# Patient Record
Sex: Female | Born: 1975 | Hispanic: No | Smoking: Former smoker
Health system: Southern US, Community
[De-identification: ages and names within clinical notes are randomized; demographics above are authoritative.]

## PROBLEM LIST (undated history)

## (undated) DIAGNOSIS — F32A Depression, unspecified: Secondary | ICD-10-CM

## (undated) DIAGNOSIS — M797 Fibromyalgia: Secondary | ICD-10-CM

## (undated) DIAGNOSIS — T8859XA Other complications of anesthesia, initial encounter: Secondary | ICD-10-CM

## (undated) DIAGNOSIS — T4145XA Adverse effect of unspecified anesthetic, initial encounter: Secondary | ICD-10-CM

## (undated) DIAGNOSIS — C801 Malignant (primary) neoplasm, unspecified: Secondary | ICD-10-CM

## (undated) DIAGNOSIS — I10 Essential (primary) hypertension: Secondary | ICD-10-CM

## (undated) DIAGNOSIS — E039 Hypothyroidism, unspecified: Secondary | ICD-10-CM

## (undated) DIAGNOSIS — Z8489 Family history of other specified conditions: Secondary | ICD-10-CM

## (undated) DIAGNOSIS — F329 Major depressive disorder, single episode, unspecified: Secondary | ICD-10-CM

## (undated) DIAGNOSIS — G43909 Migraine, unspecified, not intractable, without status migrainosus: Secondary | ICD-10-CM

## (undated) HISTORY — DX: Essential (primary) hypertension: I10

## (undated) HISTORY — DX: Fibromyalgia: M79.7

## (undated) HISTORY — PX: WISDOM TOOTH EXTRACTION: SHX21

## (undated) HISTORY — PX: CERVICAL CONIZATION W/BX: SHX1330

## (undated) HISTORY — DX: Migraine, unspecified, not intractable, without status migrainosus: G43.909

## (undated) HISTORY — PX: MICRODISCECTOMY LUMBAR: SUR864

## (undated) HISTORY — DX: Hypothyroidism, unspecified: E03.9

## (undated) HISTORY — DX: Malignant (primary) neoplasm, unspecified: C80.1

## (undated) HISTORY — DX: Major depressive disorder, single episode, unspecified: F32.9

## (undated) HISTORY — DX: Depression, unspecified: F32.A

---

## 1995-02-03 HISTORY — PX: BREAST SURGERY: SHX581

## 2002-05-11 ENCOUNTER — Encounter (INDEPENDENT_AMBULATORY_CARE_PROVIDER_SITE_OTHER): Payer: Self-pay | Admitting: *Deleted

## 2002-05-11 ENCOUNTER — Ambulatory Visit (HOSPITAL_COMMUNITY): Admission: RE | Admit: 2002-05-11 | Discharge: 2002-05-11 | Payer: Self-pay | Admitting: Obstetrics

## 2002-06-21 ENCOUNTER — Ambulatory Visit: Admission: RE | Admit: 2002-06-21 | Discharge: 2002-06-21 | Payer: Self-pay | Admitting: Gynecology

## 2002-07-18 ENCOUNTER — Ambulatory Visit (HOSPITAL_COMMUNITY): Admission: RE | Admit: 2002-07-18 | Discharge: 2002-07-18 | Payer: Self-pay | Admitting: Gynecology

## 2002-07-18 ENCOUNTER — Encounter (INDEPENDENT_AMBULATORY_CARE_PROVIDER_SITE_OTHER): Payer: Self-pay | Admitting: Specialist

## 2002-10-04 ENCOUNTER — Ambulatory Visit: Admission: RE | Admit: 2002-10-04 | Discharge: 2002-10-04 | Payer: Self-pay | Admitting: Gynecology

## 2003-08-16 ENCOUNTER — Ambulatory Visit (HOSPITAL_COMMUNITY): Admission: RE | Admit: 2003-08-16 | Discharge: 2003-08-16 | Payer: Self-pay | Admitting: Obstetrics

## 2003-09-13 ENCOUNTER — Ambulatory Visit (HOSPITAL_COMMUNITY): Admission: RE | Admit: 2003-09-13 | Discharge: 2003-09-13 | Payer: Self-pay | Admitting: Internal Medicine

## 2004-11-12 ENCOUNTER — Ambulatory Visit: Payer: Self-pay

## 2005-04-14 ENCOUNTER — Ambulatory Visit: Payer: Self-pay | Admitting: Internal Medicine

## 2007-01-24 ENCOUNTER — Ambulatory Visit: Payer: Self-pay | Admitting: Internal Medicine

## 2007-01-24 DIAGNOSIS — F329 Major depressive disorder, single episode, unspecified: Secondary | ICD-10-CM | POA: Insufficient documentation

## 2007-01-24 DIAGNOSIS — I10 Essential (primary) hypertension: Secondary | ICD-10-CM | POA: Insufficient documentation

## 2007-01-24 LAB — CONVERTED CEMR LAB
ALT: 27 units/L (ref 0–35)
AST: 24 units/L (ref 0–37)
Albumin: 4.1 g/dL (ref 3.5–5.2)
Alkaline Phosphatase: 76 units/L (ref 39–117)
BUN: 16 mg/dL (ref 6–23)
Basophils Absolute: 0 10*3/uL (ref 0.0–0.1)
Basophils Relative: 0 % (ref 0.0–1.0)
Bilirubin Urine: NEGATIVE
Bilirubin, Direct: 0.1 mg/dL (ref 0.0–0.3)
CO2: 30 meq/L (ref 19–32)
Calcium: 9.5 mg/dL (ref 8.4–10.5)
Chloride: 101 meq/L (ref 96–112)
Creatinine, Ser: 1 mg/dL (ref 0.4–1.2)
Crystals: NEGATIVE
Eosinophils Absolute: 0.1 10*3/uL (ref 0.0–0.6)
Eosinophils Relative: 1.2 % (ref 0.0–5.0)
GFR calc Af Amer: 83 mL/min
GFR calc non Af Amer: 69 mL/min
Glucose, Bld: 96 mg/dL (ref 70–99)
HCT: 46.2 % — ABNORMAL HIGH (ref 36.0–46.0)
Hemoglobin: 16.1 g/dL — ABNORMAL HIGH (ref 12.0–15.0)
Ketones, ur: NEGATIVE mg/dL
Leukocytes, UA: NEGATIVE
Lymphocytes Relative: 22.8 % (ref 12.0–46.0)
MCHC: 34.9 g/dL (ref 30.0–36.0)
MCV: 90.9 fL (ref 78.0–100.0)
Monocytes Absolute: 0.2 10*3/uL (ref 0.2–0.7)
Monocytes Relative: 2.3 % — ABNORMAL LOW (ref 3.0–11.0)
Mucus, UA: NEGATIVE
Neutro Abs: 7.3 10*3/uL (ref 1.4–7.7)
Neutrophils Relative %: 73.7 % (ref 43.0–77.0)
Nitrite: NEGATIVE
Platelets: 214 10*3/uL (ref 150–400)
Potassium: 4.2 meq/L (ref 3.5–5.1)
RBC: 5.08 M/uL (ref 3.87–5.11)
RDW: 11.3 % — ABNORMAL LOW (ref 11.5–14.6)
Sodium: 142 meq/L (ref 135–145)
Specific Gravity, Urine: 1.015 (ref 1.000–1.03)
TSH: 1.06 microintl units/mL (ref 0.35–5.50)
Total Bilirubin: 0.7 mg/dL (ref 0.3–1.2)
Total Protein, Urine: NEGATIVE mg/dL
Total Protein: 7.2 g/dL (ref 6.0–8.3)
Urine Glucose: NEGATIVE mg/dL
Urobilinogen, UA: 0.2 (ref 0.0–1.0)
WBC: 9.9 10*3/uL (ref 4.5–10.5)
pH: 6.5 (ref 5.0–8.0)

## 2007-01-25 LAB — CONVERTED CEMR LAB
ALT: 27 units/L (ref 0–35)
AST: 24 units/L (ref 0–37)
Albumin: 4.1 g/dL (ref 3.5–5.2)
Alkaline Phosphatase: 76 units/L (ref 39–117)
BUN: 16 mg/dL (ref 6–23)
Basophils Absolute: 0 10*3/uL (ref 0.0–0.1)
Basophils Relative: 0 % (ref 0.0–1.0)
Bilirubin Urine: NEGATIVE
Bilirubin, Direct: 0.1 mg/dL (ref 0.0–0.3)
CO2: 30 meq/L (ref 19–32)
Calcium: 9.5 mg/dL (ref 8.4–10.5)
Chloride: 101 meq/L (ref 96–112)
Creatinine, Ser: 1 mg/dL (ref 0.4–1.2)
Crystals: NEGATIVE
Eosinophils Absolute: 0.1 10*3/uL (ref 0.0–0.6)
Eosinophils Relative: 1.2 % (ref 0.0–5.0)
GFR calc Af Amer: 83 mL/min
GFR calc non Af Amer: 69 mL/min
Glucose, Bld: 96 mg/dL (ref 70–99)
HCT: 46.2 % — ABNORMAL HIGH (ref 36.0–46.0)
Hemoglobin: 16.1 g/dL — ABNORMAL HIGH (ref 12.0–15.0)
Ketones, ur: NEGATIVE mg/dL
Leukocytes, UA: NEGATIVE
Lymphocytes Relative: 22.8 % (ref 12.0–46.0)
MCHC: 34.9 g/dL (ref 30.0–36.0)
MCV: 90.9 fL (ref 78.0–100.0)
Monocytes Absolute: 0.2 10*3/uL (ref 0.2–0.7)
Monocytes Relative: 2.3 % — ABNORMAL LOW (ref 3.0–11.0)
Mucus, UA: NEGATIVE
Neutro Abs: 7.3 10*3/uL (ref 1.4–7.7)
Neutrophils Relative %: 73.7 % (ref 43.0–77.0)
Nitrite: NEGATIVE
Platelets: 214 10*3/uL (ref 150–400)
Potassium: 4.2 meq/L (ref 3.5–5.1)
RBC: 5.08 M/uL (ref 3.87–5.11)
RDW: 11.3 % — ABNORMAL LOW (ref 11.5–14.6)
Sodium: 142 meq/L (ref 135–145)
Specific Gravity, Urine: 1.015 (ref 1.000–1.03)
TSH: 1.06 microintl units/mL (ref 0.35–5.50)
Total Bilirubin: 0.7 mg/dL (ref 0.3–1.2)
Total Protein, Urine: NEGATIVE mg/dL
Total Protein: 7.2 g/dL (ref 6.0–8.3)
Urine Glucose: NEGATIVE mg/dL
Urobilinogen, UA: 0.2 (ref 0.0–1.0)
WBC: 9.9 10*3/uL (ref 4.5–10.5)
pH: 6.5 (ref 5.0–8.0)

## 2007-06-23 ENCOUNTER — Ambulatory Visit: Payer: Self-pay | Admitting: Internal Medicine

## 2007-06-23 DIAGNOSIS — G43009 Migraine without aura, not intractable, without status migrainosus: Secondary | ICD-10-CM | POA: Insufficient documentation

## 2007-06-23 DIAGNOSIS — K5289 Other specified noninfective gastroenteritis and colitis: Secondary | ICD-10-CM | POA: Insufficient documentation

## 2007-06-29 ENCOUNTER — Telehealth: Payer: Self-pay | Admitting: Internal Medicine

## 2007-06-29 ENCOUNTER — Encounter: Payer: Self-pay | Admitting: Internal Medicine

## 2007-07-07 ENCOUNTER — Encounter: Payer: Self-pay | Admitting: Internal Medicine

## 2007-07-13 ENCOUNTER — Telehealth (INDEPENDENT_AMBULATORY_CARE_PROVIDER_SITE_OTHER): Payer: Self-pay | Admitting: *Deleted

## 2007-09-19 ENCOUNTER — Telehealth (INDEPENDENT_AMBULATORY_CARE_PROVIDER_SITE_OTHER): Payer: Self-pay | Admitting: *Deleted

## 2008-03-19 ENCOUNTER — Telehealth (INDEPENDENT_AMBULATORY_CARE_PROVIDER_SITE_OTHER): Payer: Self-pay | Admitting: *Deleted

## 2008-07-17 ENCOUNTER — Emergency Department: Payer: Self-pay | Admitting: Emergency Medicine

## 2008-07-26 ENCOUNTER — Ambulatory Visit: Payer: Self-pay | Admitting: Otolaryngology

## 2008-08-03 ENCOUNTER — Ambulatory Visit: Payer: Self-pay | Admitting: Internal Medicine

## 2010-03-06 NOTE — Progress Notes (Signed)
Summary: xanax  Phone Note Refill Request Message from:  Fax from Pharmacy on March 19, 2008 12:32 PM  Refills Requested: Medication #1:  ALPRAZOLAM 0.5 MG TABS Take 1/2 - 1 tablet by mouth twice a day   Dosage confirmed as above?Dosage Confirmed lst appt 06/23/2007  Initial call taken by: Windell Norfolk,  March 19, 2008 12:31 PM  Follow-up for Phone Call        done hardcopy to LIM side B - debra  Follow-up by: Corwin Levins MD,  March 19, 2008 12:51 PM  Additional Follow-up for Phone Call Additional follow up Details #1::        [facility]  [id number] ([facility] [address] [place], [place]  [address] Ph: [phone] Fax: [phone] Additional Follow-up by: Shelbie Proctor,  March 19, 2008 2:28 PM    New/Updated Medications: ALPRAZOLAM 0.5 MG TABS (ALPRAZOLAM) Take 1/2 - 1 tablet by mouth twice a day as needed   Prescriptions: ALPRAZOLAM 0.5 MG TABS (ALPRAZOLAM) Take 1/2 - 1 tablet by mouth twice a day as needed  #60 x 2   Entered and Authorized by:   Corwin Levins MD   Signed by:   Corwin Levins MD on 03/19/2008   Method used:   Print then Give to Patient   RxID:   [id number]

## 2010-03-06 NOTE — Progress Notes (Signed)
Summary: alprazolam  Phone Note From Pharmacy   [facility]  [id number] Reason for Call: Needs renewal Summary of Call: Pt requesting renewal on alprazolam 0.5mg  # 60 take 1 two times a day. Last filled 06/13/07. Last ov 06/23/07 Initial call taken by: Orlan Leavens,  September 19, 2007 2:25 PM  Follow-up for Phone Call        ok for hardcopy to debra Follow-up by: Corwin Levins MD,  September 19, 2007 3:52 PM  Additional Follow-up for Phone Call Additional follow up Details #1::        September 19, 2007 4:01 PM called pt left voivce mail on home machine stating that rx hardcopy will be left at the front office for pick up Additional Follow-up by: Shelbie Proctor,  September 19, 2007 4:02 PM      Prescriptions: ALPRAZOLAM 0.5 MG TABS (ALPRAZOLAM) Take 1/2 - 1 tablet by mouth twice a day  #60 x 2   Entered and Authorized by:   Corwin Levins MD   Signed by:   Corwin Levins MD on 09/19/2007   Method used:   Print then Give to Patient   RxID:   [id number]    Appended Document: alprazolam pt called to day spoke with cherly wanted to know if the office can fax her rx to the pharmacy , I faxed the rx hardcopy to the [facility] [place] [facility] pharmacy [phone]

## 2010-03-06 NOTE — Letter (Signed)
Summary: Out of Work  [facility]  [address] [place], [place] [address]   Phone: [phone]  Fax: [phone]    Jun 23, 2007   Employee:  THOMASENIA DOWSE    To Whom It May Concern:   For Medical reasons, please excuse the above named employee from work for the following dates:  Start:   Jun 23, 2007  End:   Jun 29, 2007 ---    to return to work Jun 30, 2007  If you need additional information, please feel free to contact our office.         Sincerely,    Corwin Levins MD

## 2010-03-06 NOTE — Progress Notes (Signed)
Summary: NOt feeling better  Phone Note Call from Patient Call back at [phone]   Caller: Patient Call For: Dr Jonny Ruiz Summary of Call: Patient called stating that she is not feeling any better. She c/o weakness, congestion, coughing and no better since last appt. Sudafed is not helping at all. Pt thinks that an antibiotic may be needed. Please advise, pt  seen 06/23/07 Initial call taken by: Rock Nephew CMA,  Jun 29, 2007 1:42 PM  Follow-up for Phone Call        ok for zpack - done hardcopy Follow-up by: Corwin Levins MD,  Jun 29, 2007 2:49 PM  Additional Follow-up for Phone Call Additional follow up Details #1::        [facility] at [phone].   Pt notified. Patient requested  another work note, orig note has her returning 06/30/07 Additional Follow-up by: Rock Nephew CMA,  Jun 29, 2007 2:55 PM    Additional Follow-up for Phone Call Additional follow up Details #2::    ok to extend note to june 1 - ann to arrange Follow-up by: Corwin Levins MD,  Jun 29, 2007 3:06 PM  Additional Follow-up for Phone Call Additional follow up Details #3:: Details for Additional Follow-up Action Taken: out of work note faxed [phone] Additional Follow-up by: Maris Berger,  Jun 29, 2007 3:46 PM  New/Updated Medications: AZITHROMYCIN 250 MG  TABS (AZITHROMYCIN) 2po qd for 1 day, then 1po qd for 4days, then stop   Prescriptions: AZITHROMYCIN 250 MG  TABS (AZITHROMYCIN) 2po qd for 1 day, then 1po qd for 4days, then stop  #6 x 1   Entered and Authorized by:   Corwin Levins MD   Signed by:   Corwin Levins MD on 06/29/2007   Method used:   Print then Give to Patient   RxID:   [id number]

## 2010-03-06 NOTE — Consult Note (Signed)
[facility]   Imported By: Esmeralda Links D'jimraou 07/15/2007 12:36:21  _____________________________________________________________________  External Attachment:    Type:   Image     Comment:   External Document

## 2010-03-06 NOTE — Assessment & Plan Note (Signed)
Summary: headache/jss   Vital Signs:  Patient Profile:   35 Years Old Female Weight:      148 pounds Temp:     99.1 degrees F oral Pulse rate:   87 / minute BP sitting:   149 / 96  (right arm) Cuff size:   regular  Pt. in pain?   yes    Location:   head    Intensity:   5    Type:       aching  Vitals Entered By: Maris Berger (Jun 23, 2007 11:11 AM)                  Chief Complaint:  Headache.  History of Present Illness: here with acute onset malaise, fever, crampy abd pains and watery loose stool for 2 days; prior to that has had near daily migrain type headaches with throbbing, photophobia and nausea; has ongoing depression but could not tolerate the effexor; xanax working ok; husband lost job several months ago; she is under much stress over quotas at work - economy bad    Updated Prior Medication List: ALPRAZOLAM 0.5 MG TABS (ALPRAZOLAM) Take 1/2 - 1 tablet by mouth twice a day LEXAPRO 10 MG  TABS (ESCITALOPRAM OXALATE) 1po qd HYDROCHLOROTHIAZIDE 25 MG  TABS (HYDROCHLOROTHIAZIDE) Take1/2 by mouth qd FIORICET 50-325-40 MG  TABS (BUTALBITAL-APAP-CAFFEINE) 1po once daily as needed migraine PROMETHAZINE HCL 25 MG  TABS (PROMETHAZINE HCL) 1 by mouth q 6 hrs as needed LOMOTIL 2.5-0.025 MG  TABS (DIPHENOXYLATE-ATROPINE) 1 by mouth as needed loose stool - max 8 tabs per 24 hrs  Current Allergies (reviewed today): ! PROZAC (FLUOXETINE HCL) ! * EFFEXOR XR ZOLPIDEM TARTRATE (ZOLPIDEM TARTRATE)  Past Medical History:    Reviewed history from 01/24/2007 and no changes required:       Hypertension       cervical cancer       Depression       migraine  Past Surgical History:    Reviewed history from 01/24/2007 and no changes required:       breast reduction 1997   Family History:    Reviewed history from 01/24/2007 and no changes required:       mother with cervical cancer, depression       stroke       HTN       heart disease  Social History:  Reviewed history from 01/24/2007 and no changes required:       Current Smoker       Alcohol use-yes       Married    Review of Systems       all otherwise negative    Physical Exam  General:     mild ill Head:     Normocephalic and atraumatic without obvious abnormalities. No apparent alopecia or balding. Eyes:     No corneal or conjunctival inflammation noted. EOMI. Perrla. Ears:     bilat tm;s red Nose:     nasal dischargemucosal pallor and mucosal erythema.   Mouth:     good dentition and pharyngeal erythema.   Neck:     supple and full ROM.   Lungs:     Normal respiratory effort, chest expands symmetrically. Lungs are clear to auscultation, no crackles or wheezes. Heart:     Normal rate and regular rhythm. S1 and S2 normal without gallop, murmur, click, rub or other extra sounds. Abdomen:     diffuse mild tender, pos BS Extremities:  no edema, no ulcers     Impression & Recommendations:  Problem # 1:  GASTROENTERITIS, ACUTE (ICD-558.9) off work for one wk, lots of fluids, tylenol/advil as needed; phenergan and lomotil prn Her updated medication list for this problem includes:    Lomotil 2.5-0.025 Mg Tabs (Diphenoxylate-atropine) .[name]... 1 by mouth as needed loose stool - max 8 tabs per 24 hrs   Problem # 2:  HYPERTENSION (ICD-401.9)  Her updated medication list for this problem includes:    Hydrochlorothiazide 25 Mg Tabs (Hydrochlorothiazide) .[name]... Take1/2 by mouth qd ok to hold off on the HCTZ for at least one wk due to the diarrhea BP today: 149/96 Prior BP: 139/94 (01/24/2007)  Labs Reviewed: Creat: 1.0 (01/24/2007)   Problem # 3:  MIGRAINE, COMMON (ICD-346.10) tx with fioricet as needed, [facility] Orders: [facility] (Headache)  Her updated medication list for this problem includes:    Fioricet 50-325-40 Mg Tabs (Butalbital-apap-caffeine) .[name]... 1po once daily as needed migraine   Problem # 4:  DEPRESSION  (ICD-311)  Her updated medication list for this problem includes:    Alprazolam 0.5 Mg Tabs (Alprazolam) .[name]... Take 1/2 - 1 tablet by mouth twice a day    Lexapro 10 Mg Tabs (Escitalopram oxalate) .[name]... 1po qd treat as above, f/u any worsening signs or symptoms   Complete Medication List: 1)  Alprazolam 0.5 Mg Tabs (Alprazolam) .... Take 1/2 - 1 tablet by mouth twice a day 2)  Lexapro 10 Mg Tabs (Escitalopram oxalate) .[name].. 1po qd 3)  Hydrochlorothiazide 25 Mg Tabs (Hydrochlorothiazide) .... Take1/2 by mouth qd 4)  Fioricet 50-325-40 Mg Tabs (Butalbital-apap-caffeine) .[name].. 1po once daily as needed migraine 5)  Promethazine Hcl 25 Mg Tabs (Promethazine hcl) .[name].. 1 by mouth q 6 hrs as needed 6)  Lomotil 2.5-0.025 Mg Tabs (Diphenoxylate-atropine) .[name].. 1 by mouth as needed loose stool - max 8 tabs per 24 hrs   Patient Instructions: 1)  take all new medications as prescribed including the fioricet for migraine as needed, phenergan for nausea , and the lomotil for diarrhaea 2)  drink lots of clear liquids, take tylenola and advil for pain as needed 3)  hold off on taking the fluid pill for one week 4)  you are given the note for off work for one week today 5)  stop the effexor 6)  when the nausea and diarrhea are improved, start the lexapro 10 mg - 1 per day 7)  continue all medications that you may have been taking previously  8)  you will be contacted about the referral([facility] 9)  Please schedule a follow-up appointment as needed.   Prescriptions: LEXAPRO 10 MG  TABS (ESCITALOPRAM OXALATE) 1po qd  #30 x 11   Entered and Authorized by:   Corwin Levins MD   Signed by:   Corwin Levins MD on 06/23/2007   Method used:   Print then Give to Patient   RxID:   [id number] FIORICET 50-325-40 MG  TABS [facility]) 1po once daily as needed migraine  #40 x 1   Entered and Authorized by:   Corwin Levins MD   Signed by:   Corwin Levins MD on 06/23/2007    Method used:   Print then Give to Patient   RxID:   [id number] LOMOTIL 2.5-0.025 MG  TABS (DIPHENOXYLATE-ATROPINE) 1 by mouth as needed loose stool - max 8 tabs per 24 hrs  #40 x 1   Entered and Authorized  by:   Corwin Levins MD   Signed by:   Corwin Levins MD on 06/23/2007   Method used:   Print then Give to Patient   RxID:   [id number] PROMETHAZINE HCL 25 MG  TABS (PROMETHAZINE HCL) 1 by mouth q 6 hrs as needed  #40 x 1   Entered and Authorized by:   Corwin Levins MD   Signed by:   Corwin Levins MD on 06/23/2007   Method used:   Print then Give to Patient   RxID:   [id number] ALPRAZOLAM 0.5 MG TABS (ALPRAZOLAM) Take 1/2 - 1 tablet by mouth twice a day  #60 x 2   Entered and Authorized by:   Corwin Levins MD   Signed by:   Corwin Levins MD on 06/23/2007   Method used:   Print then Give to Patient   RxID:   [id number] FIORICET 50-325-40 MG  TABS (BUTALBITAL-APAP-CAFFEINE) 1po once daily prn  #30 x 1   Entered and Authorized by:   Corwin Levins MD   Signed by:   Corwin Levins MD on 06/23/2007   Method used:   Print then Give to Patient   RxID:   [id number]  ]

## 2010-03-06 NOTE — Progress Notes (Signed)
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 pt look worn out and had alot of anxiety  today pt was told to get back in touch with dr Jonny Ruiz to get some time off from work . per pt Psychotherapist  she has a another f/u appt next thursday. pt will be calling the office today to request time off from work Initial call taken by: Shelbie Proctor,  July 13, 2007 1:14 PM  Follow-up for Phone Call        I can refer to psychiatry, since this is a psychiatric problem and I am not a psychiatrist Follow-up by: Corwin Levins MD,  July 13, 2007 1:20 PM  Additional Follow-up for Phone Call Additional follow up Details #1::        July 13, 2007 1:54 PM called pt  pt Psychotherapist informed her of dr Jonny Ruiz response left msg on voice mail then contact pt as well to inform July 13, 2007 3:16 PM called pt again and informed about second msg Additional Follow-up by: Shelbie Proctor,  July 13, 2007 3:16 PM    Additional Follow-up for Phone Call Additional follow up Details #2::    pt states she is already seeing a psychologist just needs your help with stating she needs time off of work Follow-up by: Migdalia Dk,  July 13, 2007 2:06 PM  Additional Follow-up for Phone Call Additional follow up Details #3:: Details for Additional Follow-up Action Taken: no I decline - needs to see psychiatry Additional Follow-up by: Corwin Levins MD,  July 13, 2007 2:40 PM

## 2010-03-06 NOTE — Letter (Signed)
Summary: Out of Work  [facility]  [address] [place], [place] [address]   Phone: [phone]  Fax: [phone]    Jun 29, 2007   Employee:  [name]    To Whom It May Concern:   For Medical reasons, please excuse the above named employee from work for the following dates:  Start:    End:   please extend Stacy Stephenson out of work till 6 1/09  If you need additional information, please feel free to contact our office.         Sincerely,    Maris Berger

## 2010-03-06 NOTE — Assessment & Plan Note (Signed)
Summary: CPX/POSSIBLE LABS SAME DAY/NML  Medications Added ALPRAZOLAM 0.5 MG TABS (ALPRAZOLAM) Take 1 tablet by mouth twice a day EFFEXOR 75 MG  TABS (VENLAFAXINE HCL) 2 by mouth qd HYDROCHLOROTHIAZIDE 25 MG  TABS (HYDROCHLOROTHIAZIDE) Take1/2 by mouth qd      Allergies Added: ! PROZAC (FLUOXETINE HCL) ZOLPIDEM TARTRATE (ZOLPIDEM TARTRATE)  Vital Signs:  Patient Profile:   35 Years Old Female Weight:      154 pounds Temp:     97.2 degrees F Pulse rate:   76 / minute BP sitting:   139 / 94  (right arm) Cuff size:   regular  Pt. in pain?   no  Vitals Entered By: Maris Berger (January 24, 2007 10:55 AM)                  Chief Complaint:  cpx.  History of Present Illness: overall doing well, but would like to re-start anxiety meds, did well without significant side effects but ran out several wks ago, also BP at home and drug stores often 145 SBP for the most part, out of her BP med as well  Current Allergies (reviewed today): ! PROZAC (FLUOXETINE HCL) ZOLPIDEM TARTRATE (ZOLPIDEM TARTRATE) Updated/Current Medications (including changes made in today's visit):  ALPRAZOLAM 0.5 MG TABS (ALPRAZOLAM) Take 1 tablet by mouth twice a day EFFEXOR 75 MG  TABS (VENLAFAXINE HCL) 2 by mouth qd HYDROCHLOROTHIAZIDE 25 MG  TABS (HYDROCHLOROTHIAZIDE) Take1/2 by mouth qd   Past Medical History:    Reviewed history and no changes required:       Hypertension       cervical cancer       Depression       migraine  Past Surgical History:    Reviewed history and no changes required:       breast reduction 1997   Family History:    Reviewed history and no changes required:       mother with cervical cancer, depression       stroke       HTN       heart disease  Social History:    Reviewed history and no changes required:       Current Smoker       Alcohol use-yes   Risk Factors:  Tobacco use:  current Alcohol use:  yes   Review of Systems  The patient  denies anorexia, fever, weight loss, vision loss, decreased hearing, hoarseness, chest pain, syncope, dyspnea on exhertion, peripheral edema, prolonged cough, hemoptysis, abdominal pain, melena, hematochezia, severe indigestion/heartburn, hematuria, incontinence, genital sores, muscle weakness, suspicious skin lesions, transient blindness, difficulty walking, depression, unusual weight change, abnormal bleeding, enlarged lymph nodes, angioedema, and breast masses.     Physical Exam  General:     Well-developed,well-nourished,in no acute distress; alert,appropriate and cooperative throughout examination Head:     Normocephalic and atraumatic without obvious abnormalities. No apparent alopecia or balding. Eyes:     No corneal or conjunctival inflammation noted. EOMI. Perrla. Funduscopic exam benign, without hemorrhages, exudates or papilledema. Vision grossly normal. Ears:     External ear exam shows no significant lesions or deformities.  Otoscopic examination reveals clear canals, tympanic membranes are intact bilaterally without bulging, retraction, inflammation or discharge. Hearing is grossly normal bilaterally. Nose:     External nasal examination shows no deformity or inflammation. Nasal mucosa are pink and moist without lesions or exudates. Mouth:     Oral mucosa and oropharynx without lesions or exudates.  Teeth  in good repair. Neck:     No deformities, masses, or tenderness noted. Lungs:     Normal respiratory effort, chest expands symmetrically. Lungs are clear to auscultation, no crackles or wheezes. Heart:     Normal rate and regular rhythm. S1 and S2 normal without gallop, murmur, click, rub or other extra sounds. Abdomen:     Bowel sounds positive,abdomen soft and non-tender without masses, organomegaly or hernias noted. Msk:     No deformity or scoliosis noted of thoracic or lumbar spine.   Extremities:     No clubbing, cyanosis, edema, or deformity noted with normal full  range of motion of all joints.   Neurologic:     No cranial nerve deficits noted. Station and gait are normal. Plantar reflexes are down-going bilaterally. DTRs are symmetrical throughout. Sensory, motor and coordinative functions appear intact. Psych:     moderately anxious.      Impression & Recommendations:  Problem # 1:  PREVENTIVE HEALTH CARE (ICD-V70.0) overall doing well, counseled on routine health concerns, will check routine labs, o/w up to date and to see GYN yearly for routine pap  Orders: TLB-Udip ONLY (81003-UDIP) TLB-Udip w/ Micro (81001-URINE) TLB-BMP (Basic Metabolic Panel-BMET) (80048-METABOL) TLB-CBC Platelet - w/Differential (85025-CBCD) TLB-Hepatic/Liver Function Pnl (80076-HEPATIC) TLB-TSH (Thyroid Stimulating Hormone) (84443-TSH)   Problem # 2:  HYPERTENSION (ICD-401.9)  Her updated medication list for this problem includes:    Hydrochlorothiazide 25 Mg Tabs (Hydrochlorothiazide) .[name]... Take1/2 by mouth qd  re-start med above  Problem # 3:  DEPRESSION (ICD-311)  Her updated medication list for this problem includes:    Alprazolam 0.5 Mg Tabs (Alprazolam) .[name]... Take 1 tablet by mouth twice a day    Effexor 75 Mg Tabs (Venlafaxine hcl) .[name]... 2 by mouth qd  tx as above for depressive sz's and anxiety as she did will before, now with similar ongoing symptoms  Complete Medication List: 1)  Alprazolam 0.5 Mg Tabs (Alprazolam) .... Take 1 tablet by mouth twice a day 2)  Effexor 75 Mg Tabs (Venlafaxine hcl) .... 2 by mouth qd 3)  Hydrochlorothiazide 25 Mg Tabs (Hydrochlorothiazide) .... Take1/2 by mouth qd   Patient Instructions: 1)  Please take all medications as prescribed except start the effexor at one per day for 5 days to start, then 2 per day 2)  You will have blood work today 3)  Please schedule a follow-up appointment in 1 year.    Prescriptions: HYDROCHLOROTHIAZIDE 25 MG  TABS (HYDROCHLOROTHIAZIDE) Take1/2 by mouth qd  #100 x 3   Entered and  Authorized by:   Corwin Levins MD   Signed by:   Corwin Levins MD on 01/24/2007   Method used:   Print then Give to Patient   RxID:   [id number] EFFEXOR 75 MG  TABS (VENLAFAXINE HCL) 2 by mouth qd  #60 x 11   Entered and Authorized by:   Corwin Levins MD   Signed by:   Corwin Levins MD on 01/24/2007   Method used:   Print then Give to Patient   RxID:   [id number] ALPRAZOLAM 0.5 MG TABS (ALPRAZOLAM) Take 1 tablet by mouth twice a day  #60 x 5   Entered and Authorized by:   Corwin Levins MD   Signed by:   Corwin Levins MD on 01/24/2007   Method used:   Print then Give to Patient   RxID:   [id number]  ]

## 2010-03-14 ENCOUNTER — Emergency Department (HOSPITAL_COMMUNITY)
Admission: EM | Admit: 2010-03-14 | Discharge: 2010-03-14 | Disposition: A | Discharge status: Home / Self Care | Payer: BC Managed Care – PPO | Attending: Emergency Medicine | Admitting: Emergency Medicine

## 2010-03-14 DIAGNOSIS — R109 Unspecified abdominal pain: Secondary | ICD-10-CM | POA: Insufficient documentation

## 2010-03-14 DIAGNOSIS — R5381 Other malaise: Secondary | ICD-10-CM | POA: Insufficient documentation

## 2010-03-14 DIAGNOSIS — R197 Diarrhea, unspecified: Secondary | ICD-10-CM | POA: Insufficient documentation

## 2010-03-14 DIAGNOSIS — E860 Dehydration: Secondary | ICD-10-CM | POA: Insufficient documentation

## 2010-03-14 DIAGNOSIS — I10 Essential (primary) hypertension: Secondary | ICD-10-CM | POA: Insufficient documentation

## 2010-03-14 DIAGNOSIS — E039 Hypothyroidism, unspecified: Secondary | ICD-10-CM | POA: Insufficient documentation

## 2010-03-14 DIAGNOSIS — R112 Nausea with vomiting, unspecified: Secondary | ICD-10-CM | POA: Insufficient documentation

## 2010-03-14 LAB — CBC
HCT: 45.8 % (ref 36.0–46.0)
Hemoglobin: 16.8 g/dL — ABNORMAL HIGH (ref 12.0–15.0)
MCH: 32.5 pg (ref 26.0–34.0)
MCHC: 36.7 g/dL — ABNORMAL HIGH (ref 30.0–36.0)
MCV: 88.6 fL (ref 78.0–100.0)
Platelets: 180 10*3/uL (ref 150–400)
RBC: 5.17 MIL/uL — ABNORMAL HIGH (ref 3.87–5.11)
RDW: 11.9 % (ref 11.5–15.5)
WBC: 8 10*3/uL (ref 4.0–10.5)

## 2010-03-14 LAB — URINALYSIS, ROUTINE W REFLEX MICROSCOPIC
Bilirubin Urine: NEGATIVE
Hgb urine dipstick: NEGATIVE
Ketones, ur: NEGATIVE mg/dL
Nitrite: NEGATIVE
Protein, ur: NEGATIVE mg/dL
Specific Gravity, Urine: 1.014 (ref 1.005–1.030)
Urine Glucose, Fasting: NEGATIVE mg/dL
Urobilinogen, UA: 0.2 mg/dL (ref 0.0–1.0)
pH: 5 (ref 5.0–8.0)

## 2010-03-14 LAB — HEPATIC FUNCTION PANEL
ALT: 12 U/L (ref 0–35)
AST: 21 U/L (ref 0–37)
Albumin: 3.3 g/dL — ABNORMAL LOW (ref 3.5–5.2)
Alkaline Phosphatase: 55 U/L (ref 39–117)
Bilirubin, Direct: <0.1 mg/dL (ref 0.0–0.3)
Total Bilirubin: 0.3 mg/dL (ref 0.3–1.2)
Total Protein: 5.4 g/dL — ABNORMAL LOW (ref 6.0–8.3)

## 2010-03-14 LAB — DIFFERENTIAL
Basophils Absolute: 0 10*3/uL (ref 0.0–0.1)
Basophils Relative: 0 % (ref 0–1)
Eosinophils Absolute: 0.1 10*3/uL (ref 0.0–0.7)
Eosinophils Relative: 1 % (ref 0–5)
Lymphocytes Relative: 17 % (ref 12–46)
Lymphs Abs: 1.4 10*3/uL (ref 0.7–4.0)
Monocytes Absolute: 0.4 10*3/uL (ref 0.1–1.0)
Monocytes Relative: 5 % (ref 3–12)
Neutro Abs: 6.2 10*3/uL (ref 1.7–7.7)
Neutrophils Relative %: 77 % (ref 43–77)

## 2010-03-14 LAB — POCT I-STAT, CHEM 8
BUN: 18 mg/dL (ref 6–23)
Calcium, Ion: 1.09 mmol/L — ABNORMAL LOW (ref 1.12–1.32)
Chloride: 105 mEq/L (ref 96–112)
Creatinine, Ser: 1.2 mg/dL (ref 0.4–1.2)
Glucose, Bld: 96 mg/dL (ref 70–99)
HCT: 48 % — ABNORMAL HIGH (ref 36.0–46.0)
Hemoglobin: 16.3 g/dL — ABNORMAL HIGH (ref 12.0–15.0)
Potassium: 3.3 mEq/L — ABNORMAL LOW (ref 3.5–5.1)
Sodium: 137 mEq/L (ref 135–145)
TCO2: 20 mmol/L (ref 0–100)

## 2010-03-14 LAB — PREGNANCY, URINE: Preg Test, Ur: NEGATIVE

## 2010-03-14 LAB — LIPASE, BLOOD: Lipase: 29 U/L (ref 11–59)

## 2010-06-16 ENCOUNTER — Emergency Department: Payer: Self-pay | Admitting: Emergency Medicine

## 2010-06-17 ENCOUNTER — Ambulatory Visit: Payer: Self-pay | Admitting: Internal Medicine

## 2010-06-20 NOTE — Consult Note (Signed)
   NAME:  Stacy Stephenson, Stacy Stephenson                         ACCOUNT NO.:  [id number]   MEDICAL RECORD NO.:  [id number]                   PATIENT TYPE:  OUT   LOCATION:  GYN                                  FACILITY:  [facility]   PHYSICIAN:  De Blanch, M.D.         DATE OF BIRTH:  1975-03-13   DATE OF CONSULTATION:  DATE OF DISCHARGE:                                   CONSULTATION   REASON FOR CONSULTATION:  A 35 year old black female returns for follow up.  She underwent a cold knife conization June 15th.  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  No invasion was noted and surgical margins were free of disease.  The  patient has had an uncomplicated postoperative course.  She has discontinued  her birth control pills and had a heavy period recently.   Past medical history, surgery history are reviewed and unchanged.   DRUG ALLERGIES:  None.   SOCIAL HISTORY:  The patient is married.  She is a Hydrologist.  She  is strongly desirous of having children.   PHYSICAL EXAMINATION:  VITAL SIGNS:  Weight 151 pounds, blood pressure  142/90.  GENERAL:  The patient is a healthy female in no acute distress.  ABDOMEN:  Soft, nontender.  No masses, organomegaly, ascites, or hernias are  noted.  PELVIC:  EGBUS, vagina, bladder, urethra are normal.  Cervix has healed  nicely from the conization and does not appear to be stenotic.  The uterus  is anterior and normal shape, size, and consistency.  There are no adnexal  masses noted.   IMPRESSION:  Past history of adenocarcinoma in situ of the cervix as well as  severe dysplasia of the cervix status post conization with negative margins.    PLAN:  We recommend the patient have a repeat Pap smear in October and  thereafter if it is negative would feel comfortable with her going ahead  with attempting pregnancy.  We did discuss the possibility of cervical  cerclage if her  cervix is found to be weak or short by her obstetrician.  We  will return the patient to the care of Kathreen Cosier, M.D. but we will  be happy to see her in the future if necessary.                                               De Blanch, M.D.    DC/MEDQ  D:  10/04/2002  T:  10/04/2002  Job:  [id number]   cc:   Kathreen Cosier, M.D.  [address]  [place]  [place] [address]  Fax: [phone]   Telford Nab, R.N.  [address]  [place], [place] [address]

## 2010-06-20 NOTE — Op Note (Signed)
   NAME:  DAYANNE, YIU                         ACCOUNT NO.:  [id number]   MEDICAL RECORD NO.:  [id number]                   PATIENT TYPE:  AMB   LOCATION:  SDC                                  FACILITY:  [facility]   PHYSICIAN:  Kathreen Cosier, M.D.           DATE OF BIRTH:  04-Apr-1975   DATE OF PROCEDURE:  05/11/2002  DATE OF DISCHARGE:                                 OPERATIVE REPORT   PREOPERATIVE DIAGNOSIS:  Severe dysplasia of the cervix.   PROCEDURE:  Cold knife cone.   SURGEON:  Kathreen Cosier, M.D.   PROCEDURE:  Under general anesthesia, the patient in lithotomy position,  perineum and vagina prepped and draped.  Bladder emptied with a straight  catheter.  A weighted speculum placed in the vagina.  The cervix was grasped  at 3 o'clock and a #1 hemostatic suture placed half on the lateral aspect of  the cervix.  This was done at 9 o'clock also.  Cold knife cone done in the  usual manner and then hemostasis achieved with U-shaped sutures of 1 chromic  around the cervix.  The cervical canal was noted to be open and the  endometrial cavity sounded to 9 cm.  The patient tolerated the procedure  well, taken to the recovery room in good condition.                                                Kathreen Cosier, M.D.    BAM/MEDQ  D:  05/11/2002  T:  05/12/2002  Job:  [id number]

## 2010-06-20 NOTE — Op Note (Signed)
NAME:  Stacy Stephenson, Stacy Stephenson                         ACCOUNT NO.:  [id number]   MEDICAL RECORD NO.:  [id number]                   PATIENT TYPE:  AMB   LOCATION:  DAY                                  FACILITY:  [facility]   PHYSICIAN:  De Blanch, M.D.         DATE OF BIRTH:  [birth year]   DATE OF PROCEDURE:  07/18/2002  DATE OF DISCHARGE:                                 OPERATIVE REPORT   PREOPERATIVE DIAGNOSES:  1. Carcinoma in situ of the cervix.  2. Adenocarcinoma in situ of the cervix, status post cold knife conization     with positive endocervical margin.  3. Desiring to preserve fertility.   POSTOPERATIVE DIAGNOSES:  1. Carcinoma in situ of the cervix.  2. Adenocarcinoma in situ of the cervix, status post cold knife conization     with positive endocervical margin.  3. Desiring to preserve fertility.   OPERATION/PROCEDURE:  1. Cold knife conization.  2. Endocervical curettage.   SURGEON:  De Blanch, M.D.   ASSISTANT:  Telford Nab, R.N.   ANESTHESIA:  General with mask.   ESTIMATED BLOOD LOSS:  5 mL.   SURGICAL FINDINGS:  Examination under anesthesia revealed a uterus which is  normal shape, size and consistency and anterior ________.  The cervix was  status post cold knife conization.  It was well healed and appeared normal.  There were no adnexal masses or parametrial involvement.  The vagina was  otherwise normal.   DESCRIPTION OF PROCEDURE:  The patient was brought to the operating room and  after the attainment of general anesthesia, was placed in the lithotomy  position in candy-cane stirrups.  The perineum and vagina and vulva were  prepped with Betadine.  The bladder was emptied with a straight catheter and  the patient was draped.  The patient was examined with the above-noted  findings.  Weighted speculum was placed in the vagina and the anterior  vagina was elevated with the Deaver.  The cervix was grasped with a  tenaculum.  Two 0  Vicryl sutures were placed at 3 and 9 o'clock in the  lateral cervix to control blood supply from the cervical branches of the  uterine arteries.  Cervix was then injected with 1% Xylocaine with  epinephrine in circumferential fashion.  A #11 blade was then used to make a  circumferential incision.  The uterus was sounded in order to determine the  direction of the cervical canal.  Silk sutures were placed at 12, 3, 6 and 9  o'clock and the suture at 12 o'clock was tied for marking the specimen  later.  Using the silk sutures for traction, a long conization of the  endocervical canal was performed in hopes of getting above the positive  margin.  Once the specimen was removed, the uterus was sounded.  Endocervical curettage was performed of the residual endocervix or lower  uterine segment.  The cervical cone bed was cauterized  using  Bovie cautery.  A small piece of Surgicel was placed in the cone bed  and this was secured in place with the 3-0 chromic suture.  Hemostasis was  excellent.  Retractors were removed.  The patient was awakened from  anesthesia and taken to the recovery room in satisfactory condition.  Sponge, needle and instrument counts were correct x2.                                               De Blanch, M.D.    DC/MEDQ  D:  07/18/2002  T:  07/18/2002  Job:  [id number]   cc:   Kathreen Cosier, M.D.  [address]  [place]  [place] [address]  Fax: [phone]   Telford Nab, R.N.

## 2010-06-20 NOTE — Consult Note (Signed)
NAME:  Stacy Stephenson, Stacy Stephenson                         ACCOUNT NO.:  [id number]   MEDICAL RECORD NO.:  [id number]                   PATIENT TYPE:  OUT   LOCATION:  GYN                                  FACILITY:  [facility]   PHYSICIAN:  De Blanch, M.D.         DATE OF BIRTH:  [birth year]   DATE OF CONSULTATION:  06/21/2002  DATE OF DISCHARGE:  06/21/2002                                   CONSULTATION   HISTORY OF PRESENT ILLNESS:  A 35 year old, African-American female, seen in  consultation at the request of Kathreen Cosier, M.D.  The patient had an  abnormal Pap smear in December, ultimately leading to a cold knife  conization on May 11, 2002.  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  The ectocervical margin was negative, although the endocervical margin was  involved with both squamous and glandular in situ lesions.  A separate  fragment from the LEEP specimen revealed CIN 3, involving the endocervical  glands and margins.  The patient has had an uncomplicated postoperative  course.   PAST MEDICAL HISTORY:   MEDICAL ILLNESSES:  None.   PAST SURGICAL HISTORY:  1. Breast reduction, 1997.  2. Wisdom teeth extraction.   DRUG ALLERGIES:  None.   GYNECOLOGIC HISTORY:  Gravida 1, para 0.  The patient has regular menstrual  cycles, is using Yasmin.  Last menstrual period Jun 13, 2002.   SOCIAL HISTORY:  The patient is married.  She is a Hydrologist.  She  smokes approximately 10 cigarettes a day.  Does not drink.   FAMILY HISTORY:  Negative for breast, ovarian, or colon cancer.  She has  mother who had cervical cancer.   REVIEW OF SYSTEMS:  Otherwise negative.   PHYSICAL EXAMINATION:  VITAL SIGNS:  Weight 146 pounds, height 5 feet 1  inches, blood pressure 138/80, pulse 76, respiratory rate 18.  GENERAL:  The patient is a healthy young woman in no acute distress.  HEENT:   Negative.  NECK:  Supple without thyromegaly.  There is no supraclavicular or inguinal  adenopathy.  ABDOMEN:  Soft, nontender.  No mass or organomegaly, ascites or hernia is  noted.  PELVIC:  EG//BUS, vagina, bladder, urethra are normal.  Cervix reveals a  healing cone bed.  Uterus is anterior and normal shape, size, and  consistency.  There are no adnexal masses noted.   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   The patient is desirous of preserving fertility.  Therefore, I would  recommend she undergo a repeat conization once she has healed from the  current conization.  This cone would attempt to go high in the endocervical  canal in hopes of excising all of the dysplastic epithelium.  The patient is  aware that we cannot guarantee that  she will have negative margins.  She is  also aware that a repeat conization is at higher risk to result in cervical  stenosis, bleeding, infection, and an incompetent cervix in the future.   We will schedule surgery in the near future.                                               De Blanch, M.D.    DC/MEDQ  D:  06/27/2002  T:  06/27/2002  Job:  [id number]   cc:   Kathreen Cosier, M.D.  [address]  [place]  [place] [address]  Fax: [phone]   Telford Nab, R.N.

## 2010-08-19 ENCOUNTER — Emergency Department (HOSPITAL_COMMUNITY)
Admission: EM | Admit: 2010-08-19 | Discharge: 2010-08-19 | Disposition: A | Discharge status: Home / Self Care | Payer: BC Managed Care – PPO | Attending: Emergency Medicine | Admitting: Emergency Medicine

## 2010-08-19 DIAGNOSIS — Z79899 Other long term (current) drug therapy: Secondary | ICD-10-CM | POA: Insufficient documentation

## 2010-08-19 DIAGNOSIS — I10 Essential (primary) hypertension: Secondary | ICD-10-CM | POA: Insufficient documentation

## 2010-08-19 DIAGNOSIS — E039 Hypothyroidism, unspecified: Secondary | ICD-10-CM | POA: Insufficient documentation

## 2010-08-19 DIAGNOSIS — G43909 Migraine, unspecified, not intractable, without status migrainosus: Secondary | ICD-10-CM | POA: Insufficient documentation

## 2010-09-14 IMAGING — CT CT HEAD WITHOUT CONTRAST
2 series · 16 of 30 positions shown, 20 images · non-contrast
Comparison: none

REASON FOR EXAM: headache x 1 hear photophobia
COMMENTS:   LMP: Three weeks ago

PROCEDURE:     CT  - CT HEAD WITHOUT CONTRAST  - July 17, 2008  [DATE]
RESULT:
HISTORY: Headache.
COMPARISON STUDIES:  None.
PROCEDURE AND FINDINGS:  Standard nonenhanced head CT was obtained. No
intra-axial or extra-axial pathologic fluid or blood collections identified.
No mass lesion is noted. No hydrocephalus is noted.

[Series 2: without · axial · non-contrast · 0.41mm/px · z∈[-124,-4]mm · 13 of 28 slices shown, 17 images]
[im 2/28  brain]
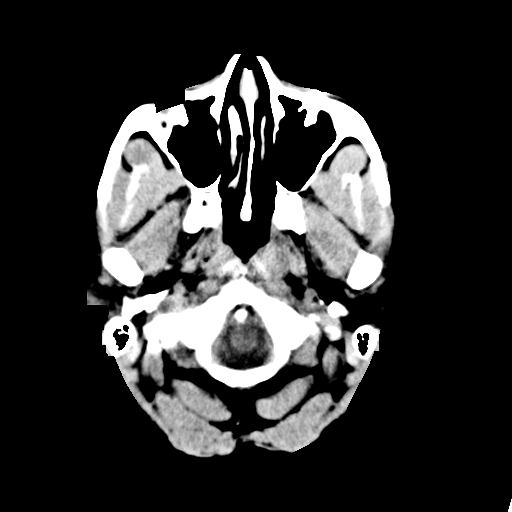
[im 2/28  bone]
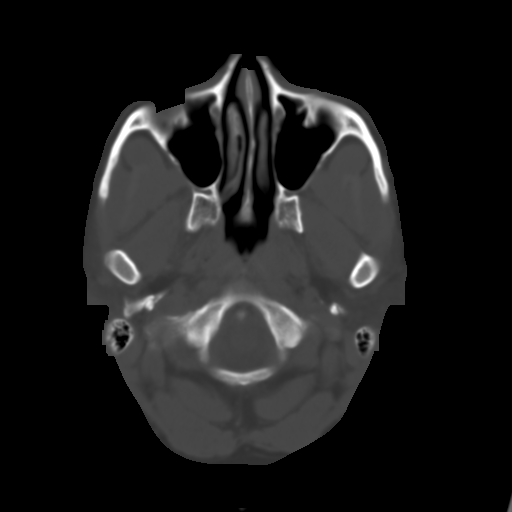
[im 4/28  brain]
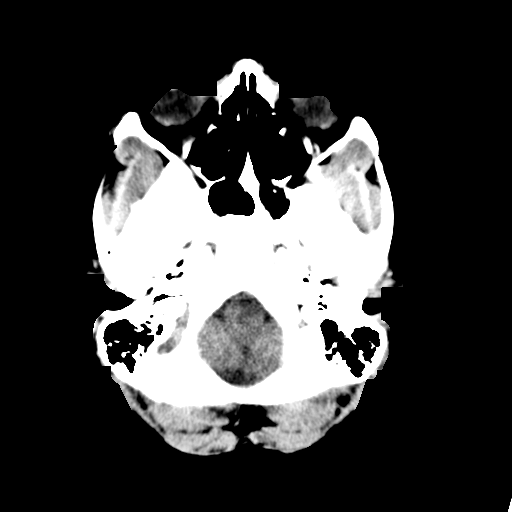
[im 6/28  brain]
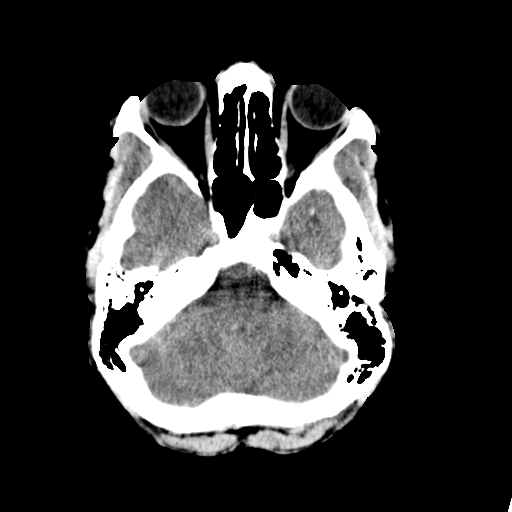
[im 8/28  brain]
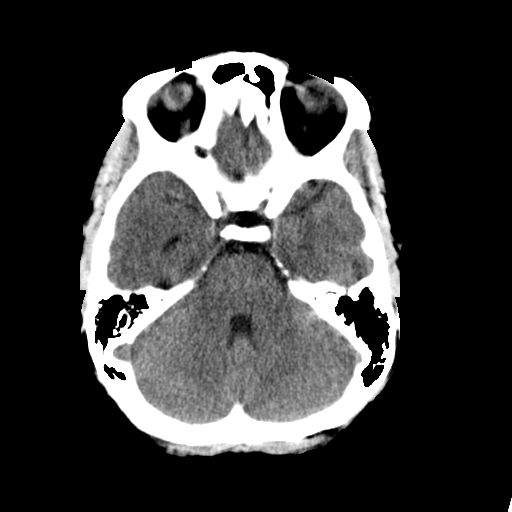
[im 10/28  brain]
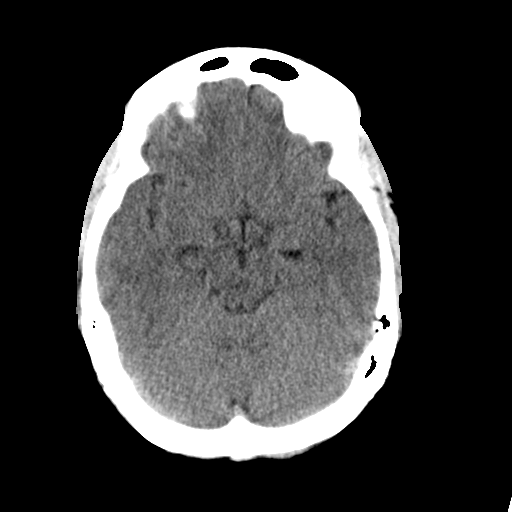
[im 10/28  bone]
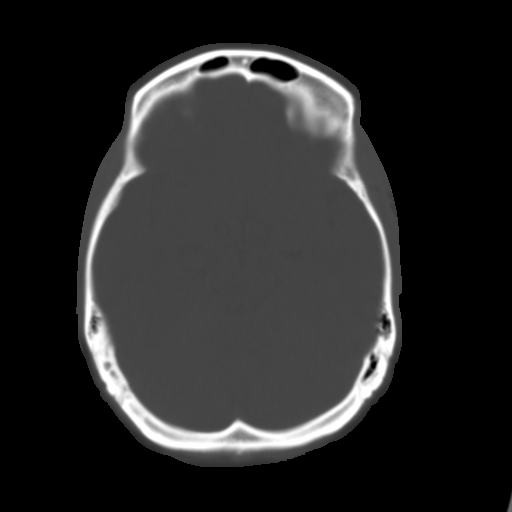
[im 12/28  brain]
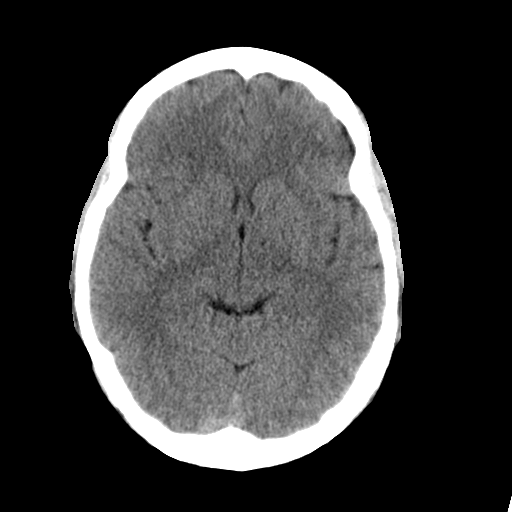
[im 14/28  brain]
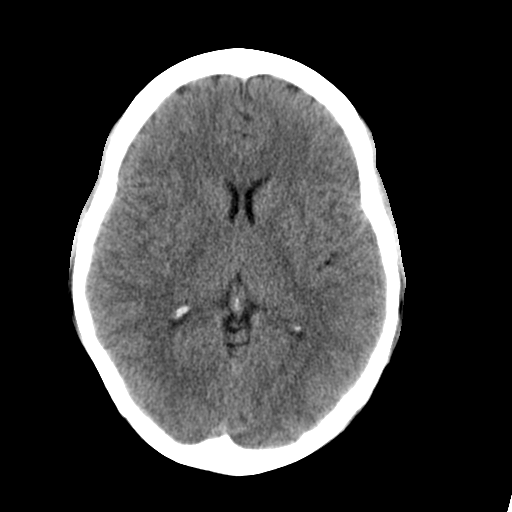
[im 16/28  brain]
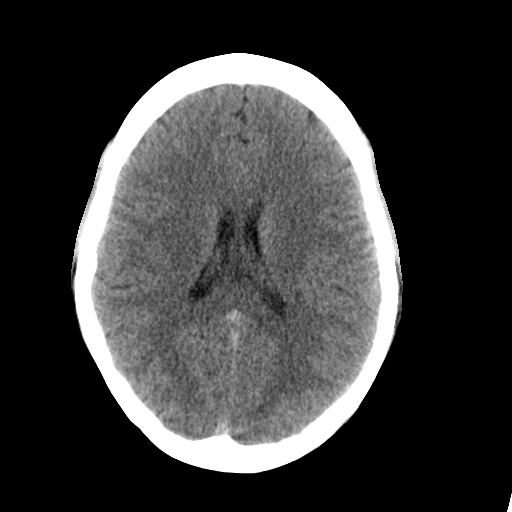
[im 18/28  brain]
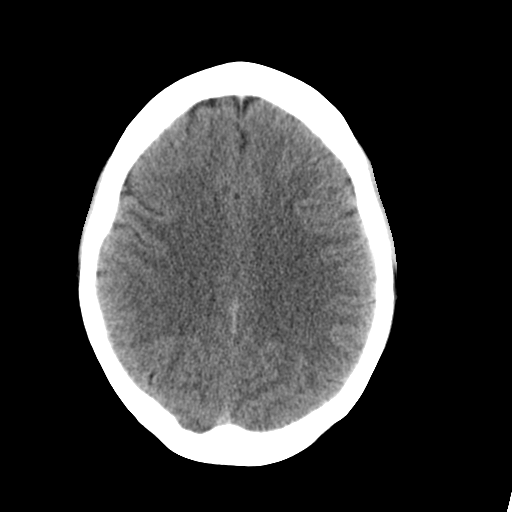
[im 18/28  bone]
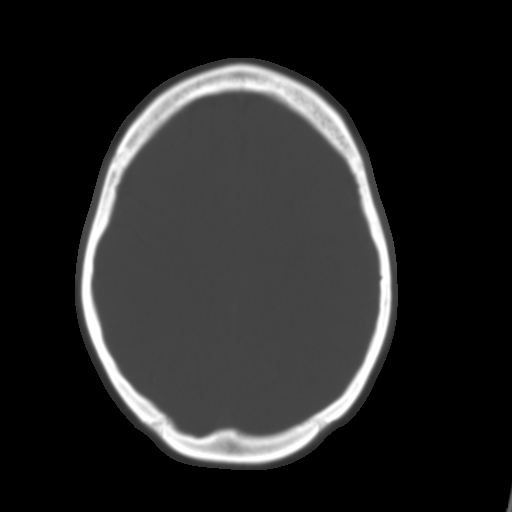
[im 20/28  brain]
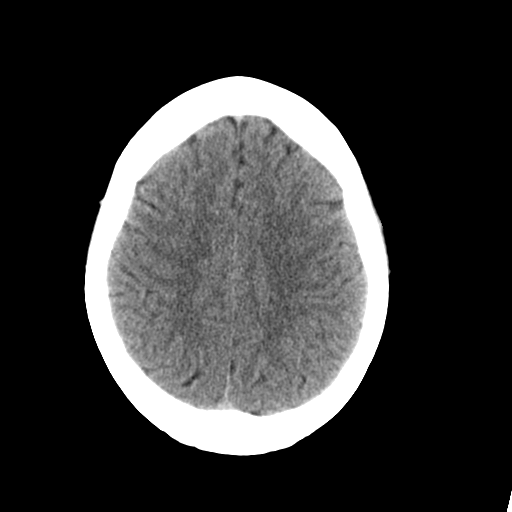
[im 22/28  brain]
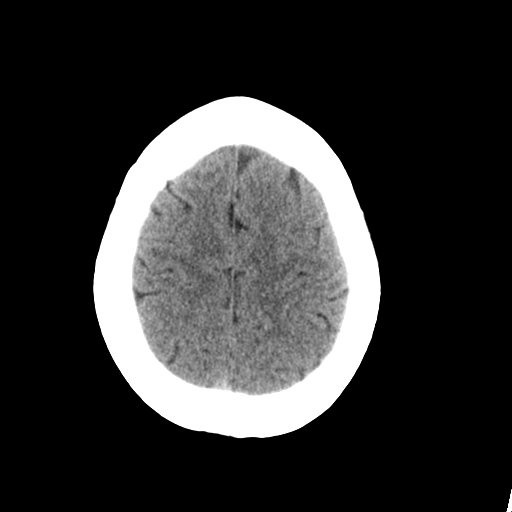
[im 24/28  brain]
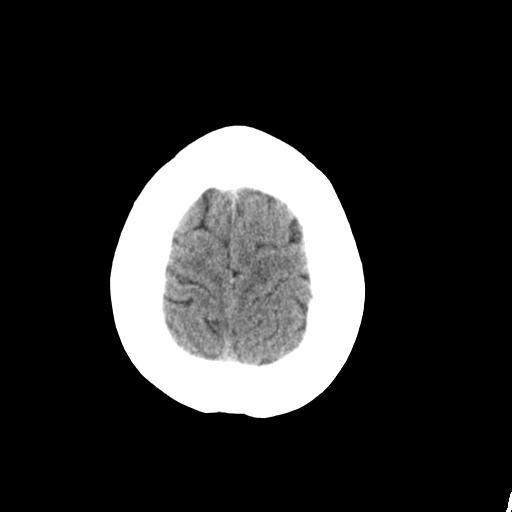
[im 26/28  brain]
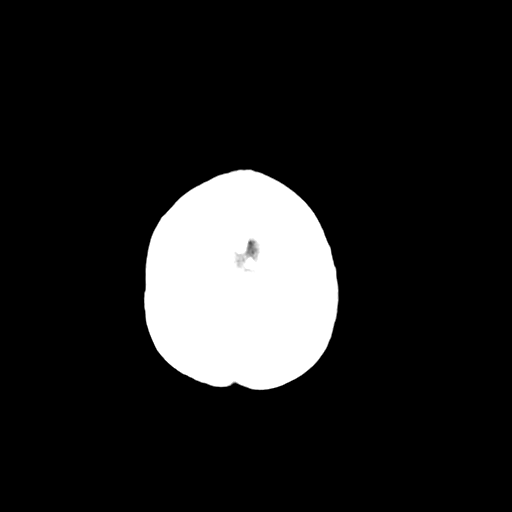
[im 26/28  bone]
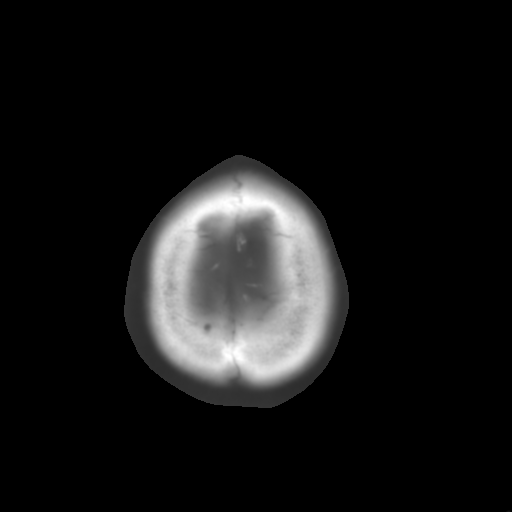

[Series 3: bone · axial · 0.41mm/px · z∈[-124,-84]mm · 3 of 28 slices shown]
[im 2/28  bone]
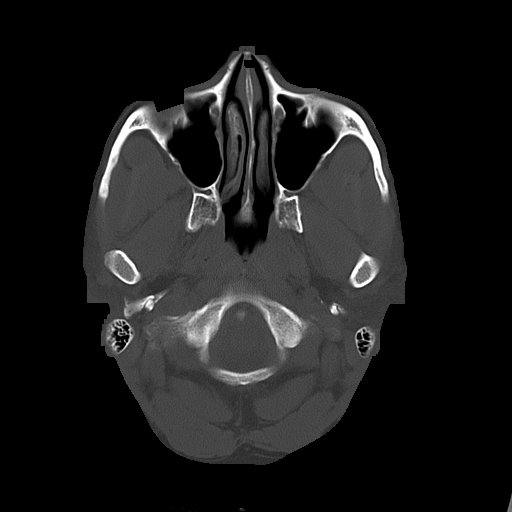
[im 6/28  bone]
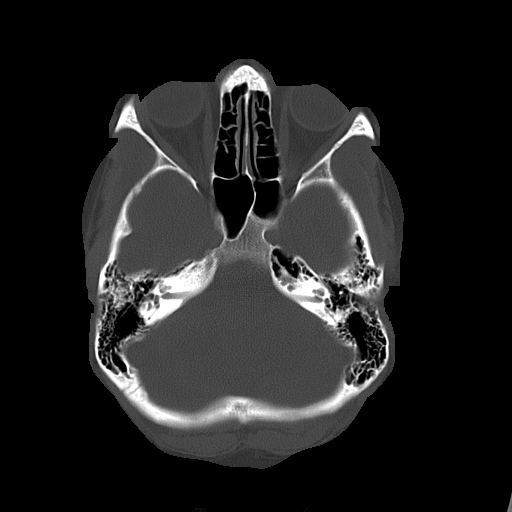
[im 10/28  bone]
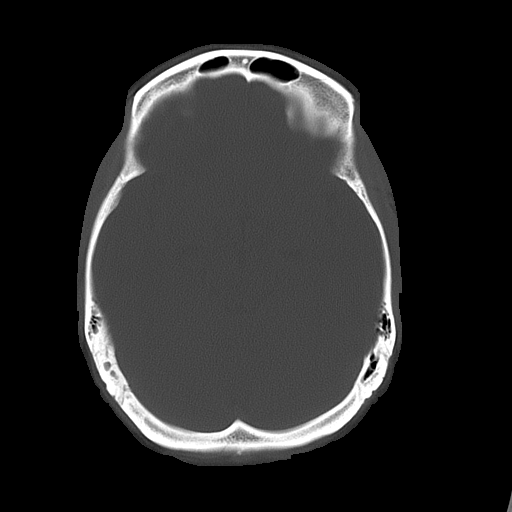

[16 of 30 positions shown; findings below may reference images not displayed]

IMPRESSION: 1.     No acute abnormality.
2.     Incidental note is made of mild mucosal thickening of the sphenoid
sinus.

## 2011-06-20 ENCOUNTER — Emergency Department: Payer: Self-pay | Admitting: Emergency Medicine

## 2011-09-28 ENCOUNTER — Emergency Department: Payer: Self-pay | Admitting: *Deleted

## 2011-10-24 ENCOUNTER — Ambulatory Visit: Payer: Self-pay | Admitting: Specialist

## 2012-02-12 ENCOUNTER — Emergency Department: Payer: Self-pay | Admitting: Emergency Medicine

## 2012-02-12 LAB — CBC
HCT: 45.7 % (ref 35.0–47.0)
HGB: 16.5 g/dL — ABNORMAL HIGH (ref 12.0–16.0)
MCH: 33.3 pg (ref 26.0–34.0)
MCHC: 36 g/dL (ref 32.0–36.0)
MCV: 92 fL (ref 80–100)
Platelet: 190 10*3/uL (ref 150–440)
RBC: 4.95 10*6/uL (ref 3.80–5.20)
RDW: 12.7 % (ref 11.5–14.5)
WBC: 9.8 10*3/uL (ref 3.6–11.0)

## 2012-02-12 LAB — URINALYSIS, COMPLETE
Bacteria: NONE SEEN
Bilirubin,UR: NEGATIVE
Blood: NEGATIVE
Glucose,UR: NEGATIVE mg/dL (ref 0–75)
Ketone: NEGATIVE
Nitrite: NEGATIVE
Ph: 6 (ref 4.5–8.0)
Protein: NEGATIVE
RBC,UR: 4 /HPF (ref 0–5)
Specific Gravity: 1.025 (ref 1.003–1.030)
Squamous Epithelial: 1
WBC UR: 3 /HPF (ref 0–5)

## 2012-02-12 LAB — COMPREHENSIVE METABOLIC PANEL
Albumin: 4.4 g/dL (ref 3.4–5.0)
Alkaline Phosphatase: 83 U/L (ref 50–136)
Anion Gap: 9 (ref 7–16)
BUN: 29 mg/dL — ABNORMAL HIGH (ref 7–18)
Bilirubin,Total: 0.3 mg/dL (ref 0.2–1.0)
Calcium, Total: 8.7 mg/dL (ref 8.5–10.1)
Chloride: 108 mmol/L — ABNORMAL HIGH (ref 98–107)
Co2: 24 mmol/L (ref 21–32)
Creatinine: 1.33 mg/dL — ABNORMAL HIGH (ref 0.60–1.30)
EGFR (African American): 59 — ABNORMAL LOW
EGFR (Non-African Amer.): 51 — ABNORMAL LOW
Glucose: 92 mg/dL (ref 65–99)
Osmolality: 287 (ref 275–301)
Potassium: 3.3 mmol/L — ABNORMAL LOW (ref 3.5–5.1)
SGOT(AST): 26 U/L (ref 15–37)
SGPT (ALT): 30 U/L (ref 12–78)
Sodium: 141 mmol/L (ref 136–145)
Total Protein: 7.5 g/dL (ref 6.4–8.2)

## 2012-05-15 ENCOUNTER — Emergency Department: Payer: Self-pay | Admitting: Emergency Medicine

## 2012-10-08 ENCOUNTER — Ambulatory Visit: Payer: Self-pay | Admitting: Specialist

## 2012-11-07 ENCOUNTER — Ambulatory Visit: Payer: Self-pay | Admitting: Pain Medicine

## 2012-11-23 ENCOUNTER — Ambulatory Visit: Payer: Self-pay | Admitting: Pain Medicine

## 2012-12-01 ENCOUNTER — Ambulatory Visit: Payer: Self-pay | Admitting: Pain Medicine

## 2012-12-10 ENCOUNTER — Emergency Department: Payer: Self-pay | Admitting: Emergency Medicine

## 2012-12-15 ENCOUNTER — Ambulatory Visit: Payer: Self-pay | Admitting: Pain Medicine

## 2013-01-04 ENCOUNTER — Ambulatory Visit: Payer: Self-pay | Admitting: Pain Medicine

## 2013-01-23 LAB — PROCEDURE REPORT - SCANNED: Pap: NEGATIVE

## 2013-01-25 ENCOUNTER — Other Ambulatory Visit (HOSPITAL_COMMUNITY): Payer: Self-pay | Admitting: Obstetrics

## 2013-01-25 DIAGNOSIS — Z1231 Encounter for screening mammogram for malignant neoplasm of breast: Secondary | ICD-10-CM

## 2013-02-07 ENCOUNTER — Ambulatory Visit (HOSPITAL_COMMUNITY)
Admission: RE | Admit: 2013-02-07 | Discharge: 2013-02-07 | Disposition: A | Discharge status: Home / Self Care | Payer: BC Managed Care – PPO | Source: Ambulatory Visit | Attending: Obstetrics | Admitting: Obstetrics

## 2013-02-07 DIAGNOSIS — Z1231 Encounter for screening mammogram for malignant neoplasm of breast: Secondary | ICD-10-CM | POA: Insufficient documentation

## 2013-02-10 ENCOUNTER — Other Ambulatory Visit: Payer: Self-pay | Admitting: Obstetrics

## 2013-02-10 DIAGNOSIS — R928 Other abnormal and inconclusive findings on diagnostic imaging of breast: Secondary | ICD-10-CM

## 2013-02-14 ENCOUNTER — Ambulatory Visit: Payer: Self-pay | Admitting: Pain Medicine

## 2013-02-20 ENCOUNTER — Ambulatory Visit
Admission: RE | Admit: 2013-02-20 | Discharge: 2013-02-20 | Disposition: A | Discharge status: Home / Self Care | Payer: BC Managed Care – PPO | Source: Ambulatory Visit | Attending: Obstetrics | Admitting: Obstetrics

## 2013-02-20 DIAGNOSIS — R928 Other abnormal and inconclusive findings on diagnostic imaging of breast: Secondary | ICD-10-CM

## 2013-02-27 ENCOUNTER — Ambulatory Visit: Payer: Self-pay | Admitting: Pain Medicine

## 2013-03-29 ENCOUNTER — Emergency Department: Payer: Self-pay | Admitting: Internal Medicine

## 2013-03-29 LAB — CBC
HCT: 49.7 % — ABNORMAL HIGH (ref 35.0–47.0)
HGB: 17 g/dL — ABNORMAL HIGH (ref 12.0–16.0)
MCH: 32.6 pg (ref 26.0–34.0)
MCHC: 34.3 g/dL (ref 32.0–36.0)
MCV: 95 fL (ref 80–100)
Platelet: 243 10*3/uL (ref 150–440)
RBC: 5.22 10*6/uL — ABNORMAL HIGH (ref 3.80–5.20)
RDW: 12.7 % (ref 11.5–14.5)
WBC: 20.6 10*3/uL — ABNORMAL HIGH (ref 3.6–11.0)

## 2013-03-29 LAB — BASIC METABOLIC PANEL
Anion Gap: 8 (ref 7–16)
BUN: 20 mg/dL — ABNORMAL HIGH (ref 7–18)
Calcium, Total: 9.6 mg/dL (ref 8.5–10.1)
Chloride: 106 mmol/L (ref 98–107)
Co2: 23 mmol/L (ref 21–32)
Creatinine: 1.15 mg/dL (ref 0.60–1.30)
EGFR (African American): >60
EGFR (Non-African Amer.): >60
Glucose: 97 mg/dL (ref 65–99)
Osmolality: 276 (ref 275–301)
Potassium: 3.9 mmol/L (ref 3.5–5.1)
Sodium: 137 mmol/L (ref 136–145)

## 2013-03-29 LAB — TROPONIN I: Troponin-I: <0.02 ng/mL

## 2013-04-03 LAB — CULTURE, BLOOD (SINGLE)

## 2013-05-19 ENCOUNTER — Ambulatory Visit: Payer: Self-pay | Admitting: Pain Medicine

## 2013-06-27 ENCOUNTER — Ambulatory Visit: Payer: Self-pay | Admitting: Pain Medicine

## 2013-07-06 ENCOUNTER — Ambulatory Visit: Payer: Self-pay | Admitting: Pain Medicine

## 2013-07-19 ENCOUNTER — Ambulatory Visit: Payer: Self-pay | Admitting: Pain Medicine

## 2013-08-08 ENCOUNTER — Ambulatory Visit: Payer: Self-pay | Admitting: Pain Medicine

## 2013-08-24 ENCOUNTER — Emergency Department: Payer: Self-pay | Admitting: Emergency Medicine

## 2013-09-04 ENCOUNTER — Ambulatory Visit: Payer: Self-pay | Admitting: Pain Medicine

## 2013-10-03 ENCOUNTER — Ambulatory Visit: Payer: Self-pay | Admitting: Pain Medicine

## 2013-11-01 ENCOUNTER — Ambulatory Visit: Payer: Self-pay | Admitting: Pain Medicine

## 2013-11-09 ENCOUNTER — Ambulatory Visit: Payer: Self-pay | Admitting: Pain Medicine

## 2013-11-20 ENCOUNTER — Ambulatory Visit: Payer: Self-pay | Admitting: Pain Medicine

## 2013-11-28 DIAGNOSIS — R635 Abnormal weight gain: Secondary | ICD-10-CM | POA: Insufficient documentation

## 2013-11-28 DIAGNOSIS — E559 Vitamin D deficiency, unspecified: Secondary | ICD-10-CM | POA: Insufficient documentation

## 2013-11-28 DIAGNOSIS — E039 Hypothyroidism, unspecified: Secondary | ICD-10-CM | POA: Insufficient documentation

## 2013-12-21 ENCOUNTER — Ambulatory Visit: Payer: Self-pay | Admitting: Pain Medicine

## 2014-01-31 DIAGNOSIS — F411 Generalized anxiety disorder: Secondary | ICD-10-CM | POA: Insufficient documentation

## 2015-01-30 ENCOUNTER — Telehealth: Payer: Self-pay | Admitting: Pain Medicine

## 2015-01-30 ENCOUNTER — Ambulatory Visit: Payer: Self-pay | Admitting: Pain Medicine

## 2015-01-30 NOTE — Telephone Encounter (Signed)
Husband had heart attack and is now in ICU / patient had to cnl appt at this time

## 2015-01-31 NOTE — Telephone Encounter (Signed)
Okay, this is good to know but it's not necessary for you to send it to me as a message. Simply reschedule. Thank you.

## 2015-02-11 ENCOUNTER — Other Ambulatory Visit: Payer: Self-pay | Admitting: Pain Medicine

## 2015-02-11 ENCOUNTER — Ambulatory Visit: Payer: Managed Care, Other (non HMO) | Attending: Pain Medicine | Admitting: Pain Medicine

## 2015-02-11 ENCOUNTER — Encounter: Payer: Self-pay | Admitting: Pain Medicine

## 2015-02-11 VITALS — BP 137/65 | HR 74 | Temp 99.2°F | Resp 18 | Ht 61.0 in | Wt 140.0 lb

## 2015-02-11 DIAGNOSIS — M549 Dorsalgia, unspecified: Secondary | ICD-10-CM | POA: Insufficient documentation

## 2015-02-11 DIAGNOSIS — M47816 Spondylosis without myelopathy or radiculopathy, lumbar region: Secondary | ICD-10-CM | POA: Diagnosis not present

## 2015-02-11 DIAGNOSIS — M792 Neuralgia and neuritis, unspecified: Secondary | ICD-10-CM

## 2015-02-11 DIAGNOSIS — F1190 Opioid use, unspecified, uncomplicated: Secondary | ICD-10-CM | POA: Diagnosis not present

## 2015-02-11 DIAGNOSIS — F329 Major depressive disorder, single episode, unspecified: Secondary | ICD-10-CM | POA: Diagnosis not present

## 2015-02-11 DIAGNOSIS — G43909 Migraine, unspecified, not intractable, without status migrainosus: Secondary | ICD-10-CM | POA: Diagnosis not present

## 2015-02-11 DIAGNOSIS — I10 Essential (primary) hypertension: Secondary | ICD-10-CM | POA: Insufficient documentation

## 2015-02-11 DIAGNOSIS — Z79899 Other long term (current) drug therapy: Secondary | ICD-10-CM | POA: Diagnosis not present

## 2015-02-11 DIAGNOSIS — E039 Hypothyroidism, unspecified: Secondary | ICD-10-CM | POA: Diagnosis not present

## 2015-02-11 DIAGNOSIS — Z7189 Other specified counseling: Secondary | ICD-10-CM

## 2015-02-11 DIAGNOSIS — F419 Anxiety disorder, unspecified: Secondary | ICD-10-CM | POA: Diagnosis not present

## 2015-02-11 DIAGNOSIS — Z5181 Encounter for therapeutic drug level monitoring: Secondary | ICD-10-CM

## 2015-02-11 DIAGNOSIS — M539 Dorsopathy, unspecified: Secondary | ICD-10-CM

## 2015-02-11 DIAGNOSIS — Z79891 Long term (current) use of opiate analgesic: Secondary | ICD-10-CM

## 2015-02-11 DIAGNOSIS — M545 Low back pain, unspecified: Secondary | ICD-10-CM | POA: Insufficient documentation

## 2015-02-11 DIAGNOSIS — M62838 Other muscle spasm: Secondary | ICD-10-CM | POA: Diagnosis not present

## 2015-02-11 DIAGNOSIS — M797 Fibromyalgia: Secondary | ICD-10-CM | POA: Diagnosis not present

## 2015-02-11 DIAGNOSIS — G8929 Other chronic pain: Secondary | ICD-10-CM | POA: Insufficient documentation

## 2015-02-11 DIAGNOSIS — F17210 Nicotine dependence, cigarettes, uncomplicated: Secondary | ICD-10-CM | POA: Insufficient documentation

## 2015-02-11 DIAGNOSIS — M961 Postlaminectomy syndrome, not elsewhere classified: Secondary | ICD-10-CM

## 2015-02-11 DIAGNOSIS — M533 Sacrococcygeal disorders, not elsewhere classified: Secondary | ICD-10-CM

## 2015-02-11 MED ORDER — OXYCODONE HCL 5 MG PO TABS: 5.0000 mg | ORAL_TABLET | Freq: Two times a day (BID) | ORAL | Status: DC | PRN

## 2015-02-11 MED ORDER — CYCLOBENZAPRINE HCL 10 MG PO TABS: 10.0000 mg | ORAL_TABLET | Freq: Every day | ORAL | Status: DC

## 2015-02-11 MED ORDER — GABAPENTIN 800 MG PO TABS
800.0000 mg | ORAL_TABLET | Freq: Every day | ORAL | Status: DC
Start: 2015-02-11 — End: 2015-02-21

## 2015-02-11 NOTE — Progress Notes (Signed)
Safety precautions to be maintained throughout the outpatient stay will include: orient to surroundings, keep bed in low position, maintain call bell within reach at all times, provide assistance with transfer out of bed and ambulation. Oxycodone pill count # 0 

## 2015-02-11 NOTE — Patient Instructions (Addendum)
Smoking Cessation, Tips for Success If you are ready to quit smoking, congratulations! You have chosen to help yourself be healthier. Cigarettes bring nicotine, tar, carbon monoxide, and other irritants into your body. Your lungs, heart, and blood vessels will be able to work better without these poisons. There are many different ways to quit smoking. Nicotine gum, nicotine patches, a nicotine inhaler, or nicotine nasal spray can help with physical craving. Hypnosis, support groups, and medicines help break the habit of smoking. WHAT THINGS CAN I DO TO MAKE QUITTING EASIER?  Here are some tips to help you quit for good: 1. Pick a date when you will quit smoking completely. Tell all of your friends and family about your plan to quit on that date. 2. Do not try to slowly cut down on the number of cigarettes you are smoking. Pick a quit date and quit smoking completely starting on that day. 3. Throw away all cigarettes.  4. Clean and remove all ashtrays from your home, work, and car. 5. On a card, write down your reasons for quitting. Carry the card with you and read it when you get the urge to smoke. 6. Cleanse your body of nicotine. Drink enough water and fluids to keep your urine clear or pale yellow. Do this after quitting to flush the nicotine from your body. 7. Learn to predict your moods. Do not let a bad situation be your excuse to have a cigarette. Some situations in your life might tempt you into wanting a cigarette. 8. Never have "just one" cigarette. It leads to wanting another and another. Remind yourself of your decision to quit. 9. Change habits associated with smoking. If you smoked while driving or when feeling stressed, try other activities to replace smoking. Stand up when drinking your coffee. Brush your teeth after eating. Sit in a different chair when you read the paper. Avoid alcohol while trying to quit, and try to drink fewer caffeinated beverages. Alcohol and caffeine may urge you  to smoke. 10. Avoid foods and drinks that can trigger a desire to smoke, such as sugary or spicy foods and alcohol. 11. Ask people who smoke not to smoke around you. 12. Have something planned to do right after eating or having a cup of coffee. For example, plan to take a walk or exercise. 13. Try a relaxation exercise to calm you down and decrease your stress. Remember, you may be tense and nervous for the first 2 weeks after you quit, but this will pass. 14. Find new activities to keep your hands busy. Play with a pen, coin, or rubber band. Doodle or draw things on paper. 15. Brush your teeth right after eating. This will help cut down on the craving for the taste of tobacco after meals. You can also try mouthwash.  16. Use oral substitutes in place of cigarettes. Try using lemon drops, carrots, cinnamon sticks, or chewing gum. Keep them handy so they are available when you have the urge to smoke. 17. When you have the urge to smoke, try deep breathing. 18. Designate your home as a nonsmoking area. 19. If you are a heavy smoker, ask your health care provider about a prescription for nicotine chewing gum. It can ease your withdrawal from nicotine. 20. Reward yourself. Set aside the cigarette money you save and buy yourself something nice. 21. Look for support from others. Join a support group or smoking cessation program. Ask someone at home or at work to help you with your plan   to quit smoking. 22. Always ask yourself, "Do I need this cigarette or is this just a reflex?" Tell yourself, "Today, I choose not to smoke," or "I do not want to smoke." You are reminding yourself of your decision to quit. 23. Do not replace cigarette smoking with electronic cigarettes (commonly called e-cigarettes). The safety of e-cigarettes is unknown, and some may contain harmful chemicals. 24. If you relapse, do not give up! Plan ahead and think about what you will do the next time you get the urge to smoke. HOW WILL  I FEEL WHEN I QUIT SMOKING? You may have symptoms of withdrawal because your body is used to nicotine (the addictive substance in cigarettes). You may crave cigarettes, be irritable, feel very hungry, cough often, get headaches, or have difficulty concentrating. The withdrawal symptoms are only temporary. They are strongest when you first quit but will go away within 10-14 days. When withdrawal symptoms occur, stay in control. Think about your reasons for quitting. Remind yourself that these are signs that your body is healing and getting used to being without cigarettes. Remember that withdrawal symptoms are easier to treat than the major diseases that smoking can cause.  Even after the withdrawal is over, expect periodic urges to smoke. However, these cravings are generally short lived and will go away whether you smoke or not. Do not smoke! WHAT RESOURCES ARE AVAILABLE TO HELP ME QUIT SMOKING? Your health care provider can direct you to community resources or hospitals for support, which may include: 1. Group support. 2. Education. 3. Hypnosis. 4. Therapy.   This information is not intended to replace advice given to you by your health care provider. Make sure you discuss any questions you have with your health care provider.   Document Released: 10/18/2003 Document Revised: 02/09/2014 Document Reviewed: 07/07/2012 Elsevier Interactive Patient Education 2016 [employer] GENERAL RISKS AND COMPLICATIONS  What are the risk, side effects and possible complications? Generally speaking, most procedures are safe.  However, with any procedure there are risks, side effects, and the possibility of complications.  The risks and complications are dependent upon the sites that are lesioned, or the type of nerve block to be performed.  The closer the procedure is to the spine, the more serious the risks are.  Great care is taken when placing the radio frequency needles, block needles or lesioning probes,  but sometimes complications can occur. 1. Infection: Any time there is an injection through the skin, there is a risk of infection.  This is why sterile conditions are used for these blocks.  There are four possible types of infection. 1. Localized skin infection. 2. Central Nervous System Infection-This can be in the form of Meningitis, which can be deadly. 3. Epidural Infections-This can be in the form of an epidural abscess, which can cause pressure inside of the spine, causing compression of the spinal cord with subsequent paralysis. This would require an emergency surgery to decompress, and there are no guarantees that the patient would recover from the paralysis. 4. Discitis-This is an infection of the intervertebral discs.  It occurs in about 1% of discography procedures.  It is difficult to treat and it may lead to surgery.        2. Pain: the needles have to go through skin and soft tissues, will cause soreness.       3. Damage to internal structures:  The nerves to be lesioned may be near blood vessels or    other nerves which can   be potentially damaged.       4. Bleeding: Bleeding is more common if the patient is taking blood thinners such as  aspirin, Coumadin, Ticiid, Plavix, etc., or if he/she have some genetic predisposition  such as hemophilia. Bleeding into the spinal canal can cause compression of the spinal  cord with subsequent paralysis.  This would require an emergency surgery to  decompress and there are no guarantees that the patient would recover from the  paralysis.       5. Pneumothorax:  Puncturing of a lung is a possibility, every time a needle is introduced in  the area of the chest or upper back.  Pneumothorax refers to free air around the  collapsed lung(s), inside of the thoracic cavity (chest cavity).  Another two possible  complications related to a similar event would include: Hemothorax and Chylothorax.   These are variations of the Pneumothorax, where instead of air  around the collapsed  lung(s), you may have blood or chyle, respectively.       6. Spinal headaches: They may occur with any procedures in the area of the spine.       7. Persistent CSF (Cerebro-Spinal Fluid) leakage: This is a rare problem, but may occur  with prolonged intrathecal or epidural catheters either due to the formation of a fistulous  track or a dural tear.       8. Nerve damage: By working so close to the spinal cord, there is always a possibility of  nerve damage, which could be as serious as a permanent spinal cord injury with  paralysis.       9. Death:  Although rare, severe deadly allergic reactions known as "Anaphylactic  reaction" can occur to any of the medications used.      10. Worsening of the symptoms:  We can always make thing worse.  What are the chances of something like this happening? Chances of any of this occuring are extremely low.  By statistics, you have more of a chance of getting killed in a motor vehicle accident: while driving to the hospital than any of the above occurring .  Nevertheless, you should be aware that they are possibilities.  In general, it is similar to taking a shower.  Everybody knows that you can slip, hit your head and get killed.  Does that mean that you should not shower again?  Nevertheless always keep in mind that statistics do not mean anything if you happen to be on the wrong side of them.  Even if a procedure has a 1 (one) in a 1,000,000 (million) chance of going wrong, it you happen to be that one..Also, keep in mind that by statistics, you have more of a chance of having something go wrong when taking medications.  Who should not have this procedure? If you are on a blood thinning medication (e.g. Coumadin, Plavix, see list of "Blood Thinners"), or if you have an active infection going on, you should not have the procedure.  If you are taking any blood thinners, please inform your physician.  How should I prepare for this  procedure?  Do not eat or drink anything at least six hours prior to the procedure.  Bring a driver with you .  It cannot be a taxi.  Come accompanied by an adult that can drive you back, and that is strong enough to help you if your legs get weak or numb from the local anesthetic.  Take all of your medicines   the morning of the procedure with just enough water to swallow them.  If you have diabetes, make sure that you are scheduled to have your procedure done first thing in the morning, whenever possible.  If you have diabetes, take only half of your insulin dose and notify our nurse that you have done so as soon as you arrive at the clinic.  If you are diabetic, but only take blood sugar pills (oral hypoglycemic), then do not take them on the morning of your procedure.  You may take them after you have had the procedure.  Do not take aspirin or any aspirin-containing medications, at least eleven (11) days prior to the procedure.  They may prolong bleeding.  Wear loose fitting clothing that may be easy to take off and that you would not mind if it got stained with Betadine or blood.  Do not wear any jewelry or perfume  Remove any nail coloring.  It will interfere with some of our monitoring equipment.  NOTE: Remember that this is not meant to be interpreted as a complete list of all possible complications.  Unforeseen problems may occur.  BLOOD THINNERS The following drugs contain aspirin or other products, which can cause increased bleeding during surgery and should not be taken for 2 weeks prior to and 1 week after surgery.  If you should need take something for relief of minor pain, you may take acetaminophen which is found in Tylenol,m Datril, Anacin-3 and Panadol. It is not blood thinner. The products listed below are.  Do not take any of the products listed below in addition to any listed on your instruction sheet.  A.P.C or A.P.C with Codeine Codeine Phosphate Capsules #3  Ibuprofen Ridaura  ABC compound Congesprin Imuran rimadil  Advil Cope Indocin Robaxisal  Alka-Seltzer Effervescent Pain Reliever and Antacid Coricidin or Coricidin-D  Indomethacin Rufen  Alka-Seltzer plus Cold Medicine Cosprin Ketoprofen S-A-C Tablets  Anacin Analgesic Tablets or Capsules Coumadin Korlgesic Salflex  Anacin Extra Strength Analgesic tablets or capsules CP-2 Tablets Lanoril Salicylate  Anaprox Cuprimine Capsules Levenox Salocol  Anexsia-D Dalteparin Magan Salsalate  Anodynos Darvon compound Magnesium Salicylate Sine-off  Ansaid Dasin Capsules Magsal Sodium Salicylate  Anturane Depen Capsules Marnal Soma  APF Arthritis pain formula Dewitt's Pills Measurin Stanback  Argesic Dia-Gesic Meclofenamic Sulfinpyrazone  Arthritis Bayer Timed Release Aspirin Diclofenac Meclomen Sulindac  Arthritis pain formula Anacin Dicumarol Medipren Supac  Analgesic (Safety coated) Arthralgen Diffunasal Mefanamic Suprofen  Arthritis Strength Bufferin Dihydrocodeine Mepro Compound Suprol  Arthropan liquid Dopirydamole Methcarbomol with Aspirin Synalgos  ASA tablets/Enseals Disalcid Micrainin Tagament  Ascriptin Doan's Midol Talwin  Ascriptin A/D Dolene Mobidin Tanderil  Ascriptin Extra Strength Dolobid Moblgesic Ticlid  Ascriptin with Codeine Doloprin or Doloprin with Codeine Momentum Tolectin  Asperbuf Duoprin Mono-gesic Trendar  Aspergum Duradyne Motrin or Motrin IB Triminicin  Aspirin plain, buffered or enteric coated Durasal Myochrisine Trigesic  Aspirin Suppositories Easprin Nalfon Trillsate  Aspirin with Codeine Ecotrin Regular or Extra Strength Naprosyn Uracel  Atromid-S Efficin Naproxen Ursinus  Auranofin Capsules Elmiron Neocylate Vanquish  Axotal Emagrin Norgesic Verin  Azathioprine Empirin or Empirin with Codeine Normiflo Vitamin E  Azolid Emprazil Nuprin Voltaren  Bayer Aspirin plain, buffered or children's or timed BC Tablets or powders Encaprin Orgaran Warfarin Sodium   Buff-a-Comp Enoxaparin Orudis Zorpin  Buff-a-Comp with Codeine Equegesic Os-Cal-Gesic   Buffaprin Excedrin plain, buffered or Extra Strength Oxalid   Bufferin Arthritis Strength Feldene Oxphenbutazone   Bufferin plain or Extra Strength Feldene Capsules Oxycodone with Aspirin     Bufferin with Codeine Fenoprofen Fenoprofen Pabalate or Pabalate-SF   Buffets II Flogesic Panagesic   Buffinol plain or Extra Strength Florinal or Florinal with Codeine Panwarfarin   Buf-Tabs Flurbiprofen Penicillamine   Butalbital Compound Four-way cold tablets Penicillin   Butazolidin Fragmin Pepto-Bismol   Carbenicillin Geminisyn Percodan   Carna Arthritis Reliever Geopen Persantine   Carprofen Gold's salt Persistin   Chloramphenicol Goody's Phenylbutazone   Chloromycetin Haltrain Piroxlcam   Clmetidine heparin Plaquenil   Cllnoril Hyco-pap Ponstel   Clofibrate Hydroxy chloroquine Propoxyphen         Before stopping any of these medications, be sure to consult the physician who ordered them.  Some, such as Coumadin (Warfarin) are ordered to prevent or treat serious conditions such as "deep thrombosis", "pumonary embolisms", and other heart problems.  The amount of time that you may need off of the medication may also vary with the medication and the reason for which you were taking it.  If you are taking any of these medications, please make sure you notify your pain physician before you undergo any procedures.         Sacroiliac (SI) Joint Injection Patient Information  Description: The sacroiliac joint connects the scrum (very low back and tailbone) to the ilium (a pelvic bone which also forms half of the hip joint).  Normally this joint experiences very little motion.  When this joint becomes inflamed or unstable low back and or hip and pelvis pain may result.  Injection of this joint with local anesthetics (numbing medicines) and steroids can provide diagnostic information and reduce pain.  This  injection is performed with the aid of x-ray guidance into the tailbone area while you are lying on your stomach.   You may experience an electrical sensation down the leg while this is being done.  You may also experience numbness.  We also may ask if we are reproducing your normal pain during the injection.  Conditions which may be treated SI injection:   Low back, buttock, hip or leg pain  Preparation for the Injection:  5. Do not eat any solid food or dairy products within 6 hours of your appointment.  6. You may drink clear liquids up to 2 hours before appointment.  Clear liquids include water, black coffee, juice or soda.  No milk or cream please. 7. You may take your regular medications, including pain medications with a sip of water before your appointment.  Diabetics should hold regular insulin (if take separately) and take 1/2 normal NPH dose the morning of the procedure.  Carry some sugar containing items with you to your appointment. 8. A driver must accompany you and be prepared to drive you home after your procedure. 9. Bring all of your current medications with you. 10. An IV may be inserted and sedation may be given at the discretion of the physician. 11. A blood pressure cuff, EKG and other monitors will often be applied during the procedure.  Some patients may need to have extra oxygen administered for a short period.  12. You will be asked to provide medical information, including your allergies, prior to the procedure.  We must know immediately if you are taking blood thinners (like Coumadin/Warfarin) or if you are allergic to IV iodine contrast (dye).  We must know if you could possible be pregnant.  Possible side effects:   Bleeding from needle site  Infection (rare, may require surgery)  Nerve injury (rare)  Numbness & tingling (temporary)  A brief convulsion  or seizure  Light-headedness (temporary)  Pain at injection site (several days)  Decreased blood  pressure (temporary)  Weakness in the leg (temporary)   Call if you experience:   New onset weakness or numbness of an extremity below the injection site that last more than 8 hours.  Hives or difficulty breathing ( go to the emergency room)  Inflammation or drainage at the injection site  Any new symptoms which are concerning to you  Please note:  Although the local anesthetic injected can often make your back/ hip/ buttock/ leg feel good for several hours after the injections, the pain will likely return.  It takes 3-7 days for steroids to work in the sacroiliac area.  You may not notice any pain relief for at least that one week.  If effective, we will often do a series of three injections spaced 3-6 weeks apart to maximally decrease your pain.  After the initial series, we generally will wait some months before a repeat injection of the same type.  If you have any questions, please call [phone] [facility]  Facet Blocks Patient Information  Description: The facets are joints in the spine between the vertebrae.  Like any joints in the body, facets can become irritated and painful.  Arthritis can also effect the facets.  By injecting steroids and local anesthetic in and around these joints, we can temporarily block the nerve supply to them.  Steroids act directly on irritated nerves and tissues to reduce selling and inflammation which often leads to decreased pain.  Facet blocks may be done anywhere along the spine from the neck to the low back depending upon the location of your pain.   After numbing the skin with local anesthetic (like Novocaine), a small needle is passed onto the facet joints under x-ray guidance.  You may experience a sensation of pressure while this is being done.  The entire block usually lasts about 15-25 minutes.   Conditions which may be treated by facet blocks:   Low back/buttock pain  Neck/shoulder  pain  Certain types of headaches  Preparation for the injection:  10. Do not eat any solid food or dairy products within 6 hours of your appointment. 11. You may drink clear liquid up to 2 hours before appointment.  Clear liquids include water, black coffee, juice or soda.  No milk or cream please. 12. You may take your regular medication, including pain medications, with a sip of water before your appointment.  Diabetics should hold regular insulin (if taken separately) and take 1/2 normal NPH dose the morning of the procedure.  Carry some sugar containing items with you to your appointment. 13. A driver must accompany you and be prepared to drive you home after your procedure. 27. Bring all your current medications with you. 15. An IV may be inserted and sedation may be given at the discretion of the physician. 16. A blood pressure cuff, EKG and other monitors will often be applied during the procedure.  Some patients may need to have extra oxygen administered for a short period. 96. You will be asked to provide medical information, including your allergies and medications, prior to the procedure.  We must know immediately if you are taking blood thinners (like Coumadin/Warfarin) or if you are allergic to IV iodine contrast (dye).  We must know if you could possible be pregnant.  Possible side-effects:   Bleeding from needle site  Infection (rare, may require surgery)  Nerve  injury (rare)  Numbness & tingling (temporary)  Difficulty urinating (rare, temporary)  Spinal headache (a headache worse with upright posture)  Light-headedness (temporary)  Pain at injection site (serveral days)  Decreased blood pressure (rare, temporary)  Weakness in arm/leg (temporary)  Pressure sensation in back/neck (temporary)   Call if you experience:   Fever/chills associated with headache or increased back/neck pain  Headache worsened by an upright position  New onset, weakness or  numbness of an extremity below the injection site  Hives or difficulty breathing (go to the emergency room)  Inflammation or drainage at the injection site(s)  Severe back/neck pain greater than usual  New symptoms which are concerning to you  Please note:  Although the local anesthetic injected can often make your back or neck feel good for several hours after the injection, the pain will likely return. It takes 3-7 days for steroids to work.  You may not notice any pain relief for at least one week.  If effective, we will often do a series of 2-3 injections spaced 3-6 weeks apart to maximally decrease your pain.  After the initial series, you may be a candidate for a more permanent nerve block of the facets.  If you have any questions, please call #[phone] [facility]  Patient instructed to get labwork drawn at the [facility].  Pre procedure instructions given with teach back 3 done.

## 2015-02-11 NOTE — Progress Notes (Signed)
Patient's Name: Stacy Stephenson MRN: [id number] DOB: 03/04/1975 DOS: 02/11/2015  Primary Reason(s) for Visit: Encounter for Medication Management CC: Back Pain   HPI:    Stacy Stephenson is a 40 y.o. year old, female patient, who returns today as an established patient. She has DEPRESSION; MIGRAINE, COMMON; HYPERTENSION; GASTROENTERITIS, ACUTE; Anxiety state; Adult hypothyroidism; Avitaminosis D; Abnormal weight gain; Chronic pain; Long term current use of opiate analgesic; Long term prescription opiate use; Opiate use; Encounter for therapeutic drug level monitoring; Encounter for chronic pain management; Failed back surgical syndrome; Chronic low back pain (Location of Primary Source of Pain); Lumbar spondylosis; Lumbar facet syndrome (Bilateral) (R>L); Chronic sacroiliac joint pain (Right); Neurogenic pain; Neuropathic pain; and Muscle spasm on her problem list.. Her primarily concern today is the Back Pain     The patient returns to the clinics today for follow-up evaluation and medication management. She had back surgery on 12/02/2013 with good results. She no longer has the lower extremity pain. However, starting on June 2016, her low back pain returned were the right is worse than the left. The pain is described to be across the lower back. She had an MRI done where the surgeon suggested to her that she could be a candidate for some further surgery but it was not necessary at this point. He is not keen on further surgery and would like to treat this conservatively. She wasn't ready underwent physical therapy at [facility] for at least 2 weeks. She did not see any significant benefit and therefore she is no longer going to it. Physical exam today would suggest that the pain is in the right SI joint and bilateral lumbar facets. With the right side being worst on the left. Based on this, we have decided to bring her back for a palliative/diagnostic right-sided lumbar facet block and SI joint  injection.  With regards to her medications, she takes gabapentin 800 mg at bedtime and has not been taking any during the day because it makes her sleepy. Because of this we will give her some gabapentin 100 mg that she can take during the day. As a muscle relaxant and she takes Flexeril 10 mg but is completely knocks her out and therefore she only takes it at bedtime. With her nurse to her pain medicine, she takes oxycodone 5 mg up to twice a day. She indicates that when she tries to take oxycodone 10 mg, this makes her very nauseous. I asked her about using some hydrocodone but she indicated that we should try that in the past it did not work in controlling her pain. At this point, we will go ahead and do the palliative injections in order to tone down her pain and hopefully this will help her medication for better. We will not make any changes to her medicines at this point.  Today's Pain Score: 7 , clinically she looks like a 2-3/10. Reported level of pain is incompatible with clinical obrservations. This may be secondary to a possible lack of understanding on how the pain scale works. Pain Type: Chronic pain Pain Location: Back Pain Orientation: Lower Pain Descriptors / Indicators: Aching, Sharp Pain Frequency: Constant  Date of Last Visit:  (last seen at [facility] in August) Service Provided on Last Visit: Med Refill  Pharmacotherapy Review:   Side-effects or Adverse reactions: None reported Effectiveness: Described as relatively effective, allowing for increase in activities of daily living (ADL) Onset of action: Within expected pharmacological parameters Duration of action: Within normal  limits for medication Peak effect: Timing and results are as within normal expected parameters [place] PMP: Compliant with practice rules and regulations UDS Results: No UDS available, at this time UDS Interpretation: No UDS available, at this time Medication Assessment Form: Reviewed. Patient indicates being  compliant with therapy Treatment compliance: Compliant Substance Use Disorder (SUD) Risk Level: Low Pharmacologic Plan: Continue therapy as is  Lab Work: Illicit Drugs No results found for: THCU, COCAINSCRNUR, PCPSCRNUR, MDMA, AMPHETMU, METHADONE, ETOH  Inflammation Markers No results found for: ESRSEDRATE, CRP  Renal Function Lab Results  Component Value Date   BUN 20* 03/29/2013   CREATININE 1.15 03/29/2013   GFRAA >60 03/29/2013   GFRNONAA >60 03/29/2013    Hepatic Function Lab Results  Component Value Date   AST 26 02/12/2012   ALT 30 02/12/2012   ALBUMIN 4.4 02/12/2012    Electrolytes Lab Results  Component Value Date   NA 137 03/29/2013   K 3.9 03/29/2013   CL 106 03/29/2013   CALCIUM 9.6 03/29/2013    Allergies:  Stacy Stephenson is allergic to fluoxetine hcl; venlafaxine; and zolpidem tartrate.  Meds:  The patient has a current medication list which includes the following prescription(s): clonazepam, cyclobenzaprine, gabapentin, hydrochlorothiazide, ibuprofen, ibuprofen, levothyroxine, levothyroxine, magnesium oxide, metoprolol tartrate, ondansetron, oxycodone, varenicline, vitamin d (ergocalciferol), oxycodone, and oxycodone. Requested Prescriptions   Signed Prescriptions Disp Refills  . cyclobenzaprine (FLEXERIL) 10 MG tablet 30 tablet 2    Sig: Take 1 tablet (10 mg total) by mouth at bedtime.  . gabapentin (NEURONTIN) 800 MG tablet 30 tablet 2    Sig: Take 1 tablet (800 mg total) by mouth at bedtime.  [name] oxyCODONE (OXY IR/ROXICODONE) 5 MG immediate release tablet 60 tablet 0    Sig: Take 1 tablet (5 mg total) by mouth 2 (two) times daily as needed for moderate pain or severe pain.  [name] oxyCODONE (OXY IR/ROXICODONE) 5 MG immediate release tablet 60 tablet 0    Sig: Take 1 tablet (5 mg total) by mouth 2 (two) times daily as needed for moderate pain or severe pain.  [name] oxyCODONE (OXY IR/ROXICODONE) 5 MG immediate release tablet 60 tablet 0    Sig: Take 1 tablet (5  mg total) by mouth 2 (two) times daily as needed for moderate pain or severe pain.    ROS:  Constitutional: Afebrile, no chills, well hydrated and well nourished Gastrointestinal: negative Musculoskeletal:negative Neurological: negative Behavioral/Psych: negative  PFSH:  Medical:  Stacy Stephenson  has a past medical history of Hypothyroidism; Depression; Migraines; Hypertension; Fibromyalgia; and Cancer ([place]). Family: family history includes Arthritis in her mother; Diabetes in her father; Heart disease in her father; Stroke in her father. Surgical:  has past surgical history that includes Breast surgery and Microdiscectomy lumbar. Tobacco:  reports that she has been smoking Cigarettes.  She has a 12 pack-year smoking history. She does not have any smokeless tobacco history on file. Alcohol:  reports that she drinks alcohol. Drug:  reports that she does not use illicit drugs.  Physical Exam:  Vitals:  Today's Vitals   02/11/15 0936 02/11/15 0941  BP: 137/65   Pulse: 74   Temp: 99.2 F (37.3 C)   Resp: 18   Height: 5\' 1"  (1.549 m)   Weight: 140 lb (63.504 kg)   SpO2: 100%   PainSc: 7  7   PainLoc: Back   Calculated BMI: Body mass index is 26.47 kg/(m^2). General appearance: alert, cooperative, appears stated age and mild distress Eyes: PERLA Respiratory: No  evidence respiratory distress, no audible rales or ronchi and no use of accessory muscles of respiration Neck: no adenopathy, no carotid bruit, no JVD, supple, symmetrical, trachea midline and thyroid not enlarged, symmetric, no tenderness/mass/nodules  Cervical Spine ROM: Adequate for flexion, extension, rotation, and lateral bending Palpation: No palpable trigger points  Upper Extremities ROM: Adequate bilaterally Strength: 5/5 for all flexors and extensors of the upper extremity, bilaterally Pulses: Palpable bilaterally Neurologic: No allodynia, No hyperesthesia, No hyperpathia and No sensory abnormalities  Lumbar  Spine ROM: Adequate for flexion, extension, rotation, and lateral bending Palpation: No palpable trigger points Lumbar Hyperextension and rotation: Non-contributory Patrick's Maneuver: Non-contributory  Lower Extremities ROM: Adequate bilaterally Strength: 5/5 for all flexors and extensors of the lower extremity, bilaterally Pulses: Palpable bilaterally Neurologic: No allodynia, No hyperesthesia, No hyperpathia, No sensory abnormalities and No antalgic gait or posture  Assessment:  Encounter Diagnosis:  Primary Diagnosis: Chronic pain [G89.29]  Plan:   Interventional Therapies: Return to clinic for a right-sided lumbar facet block plus SI joint injection under fluoroscopic guidance and IV sedation.    Stacy Stephenson was seen today for back pain.  Diagnoses and all orders for this visit:  Chronic pain -     Comprehensive metabolic panel -     C-reactive protein -     Magnesium -     Sedimentation rate -     Vitamin B12 -     Vitamin D pnl(25-hydrxy+1,25-dihy)-bld -     oxyCODONE (OXY IR/ROXICODONE) 5 MG immediate release tablet; Take 1 tablet (5 mg total) by mouth 2 (two) times daily as needed for moderate pain or severe pain. -     oxyCODONE (OXY IR/ROXICODONE) 5 MG immediate release tablet; Take 1 tablet (5 mg total) by mouth 2 (two) times daily as needed for moderate pain or severe pain. -     oxyCODONE (OXY IR/ROXICODONE) 5 MG immediate release tablet; Take 1 tablet (5 mg total) by mouth 2 (two) times daily as needed for moderate pain or severe pain.  Long term current use of opiate analgesic -     Drugs of abuse screen w/o alc, rtn urine-sln  Long term prescription opiate use  Opiate use  Encounter for therapeutic drug level monitoring  Encounter for chronic pain management  Failed back surgical syndrome  Chronic low back pain (Location of Primary Source of Pain)  Lumbar spondylosis, unspecified spinal osteoarthritis  Lumbar facet syndrome -     LUMBAR FACET(MEDIAL  BRANCH NERVE BLOCK) MBNB; Future  Chronic sacroiliac joint pain (Right) -     SACROILIAC JOINT INJECTINS; Future  Neurogenic pain  Neuropathic pain -     gabapentin (NEURONTIN) 800 MG tablet; Take 1 tablet (800 mg total) by mouth at bedtime.  Muscle spasm -     cyclobenzaprine (FLEXERIL) 10 MG tablet; Take 1 tablet (10 mg total) by mouth at bedtime.     Patient Instructions   Smoking Cessation, Tips for Success If you are ready to quit smoking, congratulations! You have chosen to help yourself be healthier. Cigarettes bring nicotine, tar, carbon monoxide, and other irritants into your body. Your lungs, heart, and blood vessels will be able to work better without these poisons. There are many different ways to quit smoking. Nicotine gum, nicotine patches, a nicotine inhaler, or nicotine nasal spray can help with physical craving. Hypnosis, support groups, and medicines help break the habit of smoking. WHAT THINGS CAN I DO TO MAKE QUITTING EASIER?  Here are some tips to help you  quit for good: 1. Pick a date when you will quit smoking completely. Tell all of your friends and family about your plan to quit on that date. 2. Do not try to slowly cut down on the number of cigarettes you are smoking. Pick a quit date and quit smoking completely starting on that day. 3. Throw away all cigarettes.  4. Clean and remove all ashtrays from your home, work, and car. 5. On a card, write down your reasons for quitting. Carry the card with you and read it when you get the urge to smoke. 6. Cleanse your body of nicotine. Drink enough water and fluids to keep your urine clear or pale yellow. Do this after quitting to flush the nicotine from your body. 7. Learn to predict your moods. Do not let a bad situation be your excuse to have a cigarette. Some situations in your life might tempt you into wanting a cigarette. 8. Never have "just one" cigarette. It leads to wanting another and another. Remind yourself  of your decision to quit. 9. Change habits associated with smoking. If you smoked while driving or when feeling stressed, try other activities to replace smoking. Stand up when drinking your coffee. Brush your teeth after eating. Sit in a different chair when you read the paper. Avoid alcohol while trying to quit, and try to drink fewer caffeinated beverages. Alcohol and caffeine may urge you to smoke. 10. Avoid foods and drinks that can trigger a desire to smoke, such as sugary or spicy foods and alcohol. 11. Ask people who smoke not to smoke around you. 12. Have something planned to do right after eating or having a cup of coffee. For example, plan to take a walk or exercise. 13. Try a relaxation exercise to calm you down and decrease your stress. Remember, you may be tense and nervous for the first 2 weeks after you quit, but this will pass. 29. Find new activities to keep your hands busy. Play with a pen, coin, or rubber band. Doodle or draw things on paper. 15. Brush your teeth right after eating. This will help cut down on the craving for the taste of tobacco after meals. You can also try mouthwash.  16. Use oral substitutes in place of cigarettes. Try using lemon drops, carrots, cinnamon sticks, or chewing gum. Keep them handy so they are available when you have the urge to smoke. 17. When you have the urge to smoke, try deep breathing. 87. Designate your home as a nonsmoking area. 19. If you are a heavy smoker, ask your health care provider about a prescription for nicotine chewing gum. It can ease your withdrawal from nicotine. 20. Reward yourself. Set aside the cigarette money you save and buy yourself something nice. 21. Look for support from others. Join a support group or smoking cessation program. Ask someone at home or at work to help you with your plan to quit smoking. 22. Always ask yourself, "Do I need this cigarette or is this just a reflex?" Tell yourself, "Today, I choose not to  smoke," or "I do not want to smoke." You are reminding yourself of your decision to quit. 23. Do not replace cigarette smoking with electronic cigarettes (commonly called e-cigarettes). The safety of e-cigarettes is unknown, and some may contain harmful chemicals. 24. If you relapse, do not give up! Plan ahead and think about what you will do the next time you get the urge to smoke. HOW WILL I FEEL WHEN I QUIT  SMOKING? You may have symptoms of withdrawal because your body is used to nicotine (the addictive substance in cigarettes). You may crave cigarettes, be irritable, feel very hungry, cough often, get headaches, or have difficulty concentrating. The withdrawal symptoms are only temporary. They are strongest when you first quit but will go away within 10-14 days. When withdrawal symptoms occur, stay in control. Think about your reasons for quitting. Remind yourself that these are signs that your body is healing and getting used to being without cigarettes. Remember that withdrawal symptoms are easier to treat than the major diseases that smoking can cause.  Even after the withdrawal is over, expect periodic urges to smoke. However, these cravings are generally short lived and will go away whether you smoke or not. Do not smoke! WHAT RESOURCES ARE AVAILABLE TO HELP ME QUIT SMOKING? Your health care provider can direct you to community resources or hospitals for support, which may include: 1. Group support. 2. Education. 3. Hypnosis. 4. Therapy.   This information is not intended to replace advice given to you by your health care provider. Make sure you discuss any questions you have with your health care provider.   Document Released: 10/18/2003 Document Revised: 02/09/2014 Document Reviewed: 07/07/2012 Elsevier Interactive Patient Education 2016 [place]  What are the risk, side effects and possible complications? Generally speaking, most procedures are  safe.  However, with any procedure there are risks, side effects, and the possibility of complications.  The risks and complications are dependent upon the sites that are lesioned, or the type of nerve block to be performed.  The closer the procedure is to the spine, the more serious the risks are.  Great care is taken when placing the radio frequency needles, block needles or lesioning probes, but sometimes complications can occur. 1. Infection: Any time there is an injection through the skin, there is a risk of infection.  This is why sterile conditions are used for these blocks.  There are four possible types of infection. 1. Localized skin infection. 2. Central Nervous System Infection-This can be in the form of Meningitis, which can be deadly. 3. Epidural Infections-This can be in the form of an epidural abscess, which can cause pressure inside of the spine, causing compression of the spinal cord with subsequent paralysis. This would require an emergency surgery to decompress, and there are no guarantees that the patient would recover from the paralysis. 4. Discitis-This is an infection of the intervertebral discs.  It occurs in about 1% of discography procedures.  It is difficult to treat and it may lead to surgery.        2. Pain: the needles have to go through skin and soft tissues, will cause soreness.       3. Damage to internal structures:  The nerves to be lesioned may be near blood vessels or    other nerves which can be potentially damaged.       4. Bleeding: Bleeding is more common if the patient is taking blood thinners such as  aspirin, Coumadin, Ticiid, Plavix, etc., or if he/she have some genetic predisposition  such as hemophilia. Bleeding into the spinal canal can cause compression of the spinal  cord with subsequent paralysis.  This would require an emergency surgery to  decompress and there are no guarantees that the patient would recover from the  paralysis.       5. Pneumothorax:   Puncturing of a lung is a possibility, every time  a needle is introduced in  the area of the chest or upper back.  Pneumothorax refers to free air around the  collapsed lung(s), inside of the thoracic cavity (chest cavity).  Another two possible  complications related to a similar event would include: Hemothorax and Chylothorax.   These are variations of the Pneumothorax, where instead of air around the collapsed  lung(s), you may have blood or chyle, respectively.       6. Spinal headaches: They may occur with any procedures in the area of the spine.       7. Persistent CSF (Cerebro-Spinal Fluid) leakage: This is a rare problem, but may occur  with prolonged intrathecal or epidural catheters either due to the formation of a fistulous  track or a dural tear.       8. Nerve damage: By working so close to the spinal cord, there is always a possibility of  nerve damage, which could be as serious as a permanent spinal cord injury with  paralysis.       9. Death:  Although rare, severe deadly allergic reactions known as "Anaphylactic  reaction" can occur to any of the medications used.      10. Worsening of the symptoms:  We can always make thing worse.  What are the chances of something like this happening? Chances of any of this occuring are extremely low.  By statistics, you have more of a chance of getting killed in a motor vehicle accident: while driving to the hospital than any of the above occurring .  Nevertheless, you should be aware that they are possibilities.  In general, it is similar to taking a shower.  Everybody knows that you can slip, hit your head and get killed.  Does that mean that you should not shower again?  Nevertheless always keep in mind that statistics do not mean anything if you happen to be on the wrong side of them.  Even if a procedure has a 1 (one) in a 1,000,000 (million) chance of going wrong, it you happen to be that one..Also, keep in mind that by statistics, you have more of  a chance of having something go wrong when taking medications.  Who should not have this procedure? If you are on a blood thinning medication (e.g. Coumadin, Plavix, see list of "Blood Thinners"), or if you have an active infection going on, you should not have the procedure.  If you are taking any blood thinners, please inform your physician.  How should I prepare for this procedure?  Do not eat or drink anything at least six hours prior to the procedure.  Bring a driver with you .  It cannot be a taxi.  Come accompanied by an adult that can drive you back, and that is strong enough to help you if your legs get weak or numb from the local anesthetic.  Take all of your medicines the morning of the procedure with just enough water to swallow them.  If you have diabetes, make sure that you are scheduled to have your procedure done first thing in the morning, whenever possible.  If you have diabetes, take only half of your insulin dose and notify our nurse that you have done so as soon as you arrive at the clinic.  If you are diabetic, but only take blood sugar pills (oral hypoglycemic), then do not take them on the morning of your procedure.  You may take them after you have had the procedure.  Do not take aspirin or any aspirin-containing medications, at least eleven (11) days prior to the procedure.  They may prolong bleeding.  Wear loose fitting clothing that may be easy to take off and that you would not mind if it got stained with Betadine or blood.  Do not wear any jewelry or perfume  Remove any nail coloring.  It will interfere with some of our monitoring equipment.  NOTE: Remember that this is not meant to be interpreted as a complete list of all possible complications.  Unforeseen problems may occur.  BLOOD THINNERS The following drugs contain aspirin or other products, which can cause increased bleeding during surgery and should not be taken for 2 weeks prior to and 1 week  after surgery.  If you should need take something for relief of minor pain, you may take acetaminophen which is found in Tylenol,m Datril, Anacin-3 and Panadol. It is not blood thinner. The products listed below are.  Do not take any of the products listed below in addition to any listed on your instruction sheet.  A.P.C or A.P.C with Codeine Codeine Phosphate Capsules #3 Ibuprofen Ridaura  ABC compound Congesprin Imuran rimadil  Advil Cope Indocin Robaxisal  Alka-Seltzer Effervescent Pain Reliever and Antacid Coricidin or Coricidin-D  Indomethacin Rufen  Alka-Seltzer plus Cold Medicine Cosprin Ketoprofen S-A-C Tablets  Anacin Analgesic Tablets or Capsules Coumadin Korlgesic Salflex  Anacin Extra Strength Analgesic tablets or capsules CP-2 Tablets Lanoril Salicylate  Anaprox Cuprimine Capsules Levenox Salocol  Anexsia-D Dalteparin Magan Salsalate  Anodynos Darvon compound Magnesium Salicylate Sine-off  Ansaid Dasin Capsules Magsal Sodium Salicylate  Anturane Depen Capsules Marnal Soma  APF Arthritis pain formula Dewitt's Pills Measurin Stanback  Argesic Dia-Gesic Meclofenamic Sulfinpyrazone  Arthritis Bayer Timed Release Aspirin Diclofenac Meclomen Sulindac  Arthritis pain formula Anacin Dicumarol Medipren Supac  Analgesic (Safety coated) Arthralgen Diffunasal Mefanamic Suprofen  Arthritis Strength Bufferin Dihydrocodeine Mepro Compound Suprol  Arthropan liquid Dopirydamole Methcarbomol with Aspirin Synalgos  ASA tablets/Enseals Disalcid Micrainin Tagament  Ascriptin Doan's Midol Talwin  Ascriptin A/D Dolene Mobidin Tanderil  Ascriptin Extra Strength Dolobid Moblgesic Ticlid  Ascriptin with Codeine Doloprin or Doloprin with Codeine Momentum Tolectin  Asperbuf Duoprin Mono-gesic Trendar  Aspergum Duradyne Motrin or Motrin IB Triminicin  Aspirin plain, buffered or enteric coated Durasal Myochrisine Trigesic  Aspirin Suppositories Easprin Nalfon Trillsate  Aspirin with Codeine Ecotrin  Regular or Extra Strength Naprosyn Uracel  Atromid-S Efficin Naproxen Ursinus  Auranofin Capsules Elmiron Neocylate Vanquish  Axotal Emagrin Norgesic Verin  Azathioprine Empirin or Empirin with Codeine Normiflo Vitamin E  Azolid Emprazil Nuprin Voltaren  Bayer Aspirin plain, buffered or children's or timed BC Tablets or powders Encaprin Orgaran Warfarin Sodium  Buff-a-Comp Enoxaparin Orudis Zorpin  Buff-a-Comp with Codeine Equegesic Os-Cal-Gesic   Buffaprin Excedrin plain, buffered or Extra Strength Oxalid   Bufferin Arthritis Strength Feldene Oxphenbutazone   Bufferin plain or Extra Strength Feldene Capsules Oxycodone with Aspirin   Bufferin with Codeine Fenoprofen Fenoprofen Pabalate or Pabalate-SF   Buffets II Flogesic Panagesic   Buffinol plain or Extra Strength Florinal or Florinal with Codeine Panwarfarin   Buf-Tabs Flurbiprofen Penicillamine   Butalbital Compound Four-way cold tablets Penicillin   Butazolidin Fragmin Pepto-Bismol   Carbenicillin Geminisyn Percodan   Carna Arthritis Reliever Geopen Persantine   Carprofen Gold's salt Persistin   Chloramphenicol Goody's Phenylbutazone   Chloromycetin Haltrain Piroxlcam   Clmetidine heparin Plaquenil   Cllnoril Hyco-pap Ponstel   Clofibrate Hydroxy chloroquine Propoxyphen         Before stopping  any of these medications, be sure to consult the physician who ordered them.  Some, such as Coumadin (Warfarin) are ordered to prevent or treat serious conditions such as "deep thrombosis", "pumonary embolisms", and other heart problems.  The amount of time that you may need off of the medication may also vary with the medication and the reason for which you were taking it.  If you are taking any of these medications, please make sure you notify your pain physician before you undergo any procedures.         Sacroiliac (SI) Joint Injection Patient Information  Description: The sacroiliac joint connects the scrum (very low back and  tailbone) to the ilium (a pelvic bone which also forms half of the hip joint).  Normally this joint experiences very little motion.  When this joint becomes inflamed or unstable low back and or hip and pelvis pain may result.  Injection of this joint with local anesthetics (numbing medicines) and steroids can provide diagnostic information and reduce pain.  This injection is performed with the aid of x-ray guidance into the tailbone area while you are lying on your stomach.   You may experience an electrical sensation down the leg while this is being done.  You may also experience numbness.  We also may ask if we are reproducing your normal pain during the injection.  Conditions which may be treated SI injection:   Low back, buttock, hip or leg pain  Preparation for the Injection:  5. Do not eat any solid food or dairy products within 6 hours of your appointment.  6. You may drink clear liquids up to 2 hours before appointment.  Clear liquids include water, black coffee, juice or soda.  No milk or cream please. 7. You may take your regular medications, including pain medications with a sip of water before your appointment.  Diabetics should hold regular insulin (if take separately) and take 1/2 normal NPH dose the morning of the procedure.  Carry some sugar containing items with you to your appointment. 8. A driver must accompany you and be prepared to drive you home after your procedure. 9. Bring all of your current medications with you. 10. An IV may be inserted and sedation may be given at the discretion of the physician. 11. A blood pressure cuff, EKG and other monitors will often be applied during the procedure.  Some patients may need to have extra oxygen administered for a short period.  12. You will be asked to provide medical information, including your allergies, prior to the procedure.  We must know immediately if you are taking blood thinners (like Coumadin/Warfarin) or if you are  allergic to IV iodine contrast (dye).  We must know if you could possible be pregnant.  Possible side effects:   Bleeding from needle site  Infection (rare, may require surgery)  Nerve injury (rare)  Numbness & tingling (temporary)  A brief convulsion or seizure  Light-headedness (temporary)  Pain at injection site (several days)  Decreased blood pressure (temporary)  Weakness in the leg (temporary)   Call if you experience:   New onset weakness or numbness of an extremity below the injection site that last more than 8 hours.  Hives or difficulty breathing ( go to the emergency room)  Inflammation or drainage at the injection site  Any new symptoms which are concerning to you  Please note:  Although the local anesthetic injected can often make your back/ hip/ buttock/ leg feel good for several  hours after the injections, the pain will likely return.  It takes 3-7 days for steroids to work in the sacroiliac area.  You may not notice any pain relief for at least that one week.  If effective, we will often do a series of three injections spaced 3-6 weeks apart to maximally decrease your pain.  After the initial series, we generally will wait some months before a repeat injection of the same type.  If you have any questions, please call [phone] [facility]  Facet Blocks Patient Information  Description: The facets are joints in the spine between the vertebrae.  Like any joints in the body, facets can become irritated and painful.  Arthritis can also effect the facets.  By injecting steroids and local anesthetic in and around these joints, we can temporarily block the nerve supply to them.  Steroids act directly on irritated nerves and tissues to reduce selling and inflammation which often leads to decreased pain.  Facet blocks may be done anywhere along the spine from the neck to the low back depending upon the location of your  pain.   After numbing the skin with local anesthetic (like Novocaine), a small needle is passed onto the facet joints under x-ray guidance.  You may experience a sensation of pressure while this is being done.  The entire block usually lasts about 15-25 minutes.   Conditions which may be treated by facet blocks:   Low back/buttock pain  Neck/shoulder pain  Certain types of headaches  Preparation for the injection:  10. Do not eat any solid food or dairy products within 6 hours of your appointment. 11. You may drink clear liquid up to 2 hours before appointment.  Clear liquids include water, black coffee, juice or soda.  No milk or cream please. 12. You may take your regular medication, including pain medications, with a sip of water before your appointment.  Diabetics should hold regular insulin (if taken separately) and take 1/2 normal NPH dose the morning of the procedure.  Carry some sugar containing items with you to your appointment. 13. A driver must accompany you and be prepared to drive you home after your procedure. 58. Bring all your current medications with you. 15. An IV may be inserted and sedation may be given at the discretion of the physician. 16. A blood pressure cuff, EKG and other monitors will often be applied during the procedure.  Some patients may need to have extra oxygen administered for a short period. 51. You will be asked to provide medical information, including your allergies and medications, prior to the procedure.  We must know immediately if you are taking blood thinners (like Coumadin/Warfarin) or if you are allergic to IV iodine contrast (dye).  We must know if you could possible be pregnant.  Possible side-effects:   Bleeding from needle site  Infection (rare, may require surgery)  Nerve injury (rare)  Numbness & tingling (temporary)  Difficulty urinating (rare, temporary)  Spinal headache (a headache worse with upright  posture)  Light-headedness (temporary)  Pain at injection site (serveral days)  Decreased blood pressure (rare, temporary)  Weakness in arm/leg (temporary)  Pressure sensation in back/neck (temporary)   Call if you experience:   Fever/chills associated with headache or increased back/neck pain  Headache worsened by an upright position  New onset, weakness or numbness of an extremity below the injection site  Hives or difficulty breathing (go to the emergency room)  Inflammation or drainage  at the injection site(s)  Severe back/neck pain greater than usual  New symptoms which are concerning to you  Please note:  Although the local anesthetic injected can often make your back or neck feel good for several hours after the injection, the pain will likely return. It takes 3-7 days for steroids to work.  You may not notice any pain relief for at least one week.  If effective, we will often do a series of 2-3 injections spaced 3-6 weeks apart to maximally decrease your pain.  After the initial series, you may be a candidate for a more permanent nerve block of the facets.  If you have any questions, please call #[phone]) [facility]  Patient instructed to get labwork drawn at the [facility].  Pre procedure instructions given with teach back 3 done.  Medications discontinued today:  Medications Discontinued During This Encounter  Medication Reason  . clonazePAM (KLONOPIN) 2 MG tablet Error  . cyclobenzaprine (FLEXERIL) 10 MG tablet Error  . gabapentin (NEURONTIN) 400 MG capsule Error  . gabapentin (NEURONTIN) 800 MG tablet Error  . levothyroxine (SYNTHROID, LEVOTHROID) 50 MCG tablet Error  . metoprolol succinate (TOPROL-XL) 100 MG 24 hr tablet Error  . oxyCODONE-acetaminophen (PERCOCET) 10-325 MG tablet Error  . topiramate (TOPAMAX) 200 MG tablet Error  . cyclobenzaprine (FLEXERIL) 10 MG tablet Reorder  . gabapentin (NEURONTIN) 800  MG tablet Reorder  . oxyCODONE (OXY IR/ROXICODONE) 5 MG immediate release tablet Reorder   Medications administered today:  Stacy Stephenson had no medications administered during this visit.  Primary Care Physician: Cathlean Cower, MD Location: [facility] Note by: Kathlen Brunswick. Dossie Arbour, M.D, DABA, DABAPM, DABPM, DABIPP, FIPP

## 2015-02-14 ENCOUNTER — Telehealth: Payer: Self-pay | Admitting: Pain Medicine

## 2015-02-14 NOTE — Telephone Encounter (Signed)
States Dr. Dossie Arbour was to write Neurontin 100mg  as well as the 800mg . The 100mg  was not sent. Will discuss this at appt on [facility]

## 2015-02-14 NOTE — Telephone Encounter (Signed)
Pharmacy has not received e script for Neurontin

## 2015-02-16 LAB — TOXASSURE SELECT 13 (MW), URINE: PDF: 0

## 2015-02-21 ENCOUNTER — Ambulatory Visit: Payer: Managed Care, Other (non HMO) | Attending: Pain Medicine | Admitting: Pain Medicine

## 2015-02-21 ENCOUNTER — Encounter: Payer: Self-pay | Admitting: Pain Medicine

## 2015-02-21 VITALS — BP 128/86 | HR 71 | Temp 98.5°F | Resp 16 | Ht 61.0 in | Wt 138.0 lb

## 2015-02-21 DIAGNOSIS — G43909 Migraine, unspecified, not intractable, without status migrainosus: Secondary | ICD-10-CM | POA: Insufficient documentation

## 2015-02-21 DIAGNOSIS — F329 Major depressive disorder, single episode, unspecified: Secondary | ICD-10-CM | POA: Diagnosis not present

## 2015-02-21 DIAGNOSIS — M62838 Other muscle spasm: Secondary | ICD-10-CM | POA: Insufficient documentation

## 2015-02-21 DIAGNOSIS — E039 Hypothyroidism, unspecified: Secondary | ICD-10-CM | POA: Diagnosis not present

## 2015-02-21 DIAGNOSIS — K529 Noninfective gastroenteritis and colitis, unspecified: Secondary | ICD-10-CM | POA: Diagnosis not present

## 2015-02-21 DIAGNOSIS — I10 Essential (primary) hypertension: Secondary | ICD-10-CM | POA: Insufficient documentation

## 2015-02-21 DIAGNOSIS — M47816 Spondylosis without myelopathy or radiculopathy, lumbar region: Secondary | ICD-10-CM | POA: Diagnosis not present

## 2015-02-21 DIAGNOSIS — M545 Low back pain: Secondary | ICD-10-CM | POA: Diagnosis not present

## 2015-02-21 DIAGNOSIS — F1190 Opioid use, unspecified, uncomplicated: Secondary | ICD-10-CM | POA: Diagnosis not present

## 2015-02-21 DIAGNOSIS — M792 Neuralgia and neuritis, unspecified: Secondary | ICD-10-CM

## 2015-02-21 DIAGNOSIS — G8929 Other chronic pain: Secondary | ICD-10-CM | POA: Diagnosis not present

## 2015-02-21 DIAGNOSIS — M533 Sacrococcygeal disorders, not elsewhere classified: Secondary | ICD-10-CM | POA: Diagnosis not present

## 2015-02-21 DIAGNOSIS — M549 Dorsalgia, unspecified: Secondary | ICD-10-CM | POA: Diagnosis present

## 2015-02-21 MED ORDER — ROPIVACAINE HCL 2 MG/ML IJ SOLN
INTRAMUSCULAR | Status: AC
Start: 2015-02-21 — End: 2015-02-21
  Administered 2015-02-21: 9 mL
  Filled 2015-02-21: qty 10

## 2015-02-21 MED ORDER — METHYLPREDNISOLONE ACETATE 40 MG/ML IJ SUSP
INTRAMUSCULAR | Status: AC
  Administered 2015-02-21: 40 mg via INTRA_ARTICULAR
  Filled 2015-02-21: qty 1

## 2015-02-21 MED ORDER — ROPIVACAINE HCL 2 MG/ML IJ SOLN
9.0000 mL | Freq: Once | INTRAMUSCULAR | Status: AC
  Administered 2015-02-21: 9 mL

## 2015-02-21 MED ORDER — FENTANYL CITRATE (PF) 100 MCG/2ML IJ SOLN
100.0000 ug | Freq: Once | INTRAMUSCULAR | Status: AC
  Administered 2015-02-21: 100 ug via INTRAVENOUS

## 2015-02-21 MED ORDER — TRIAMCINOLONE ACETONIDE 40 MG/ML IJ SUSP
INTRAMUSCULAR | Status: AC
  Administered 2015-02-21: 40 mg
  Filled 2015-02-21: qty 1

## 2015-02-21 MED ORDER — METHYLPREDNISOLONE ACETATE 40 MG/ML IJ SUSP
40.0000 mg | Freq: Once | INTRAMUSCULAR | Status: AC
  Administered 2015-02-21 (×2): 40 mg via INTRA_ARTICULAR

## 2015-02-21 MED ORDER — FENTANYL CITRATE (PF) 100 MCG/2ML IJ SOLN
INTRAMUSCULAR | Status: AC
  Administered 2015-02-21: 100 ug via INTRAVENOUS
  Filled 2015-02-21: qty 2

## 2015-02-21 MED ORDER — MIDAZOLAM HCL 5 MG/5ML IJ SOLN
5.0000 mg | Freq: Once | INTRAMUSCULAR | Status: AC
  Administered 2015-02-21: 5 mg via INTRAVENOUS

## 2015-02-21 MED ORDER — MIDAZOLAM HCL 5 MG/5ML IJ SOLN
INTRAMUSCULAR | Status: AC
  Administered 2015-02-21: 5 mg via INTRAVENOUS
  Filled 2015-02-21: qty 5

## 2015-02-21 MED ORDER — ROPIVACAINE HCL 2 MG/ML IJ SOLN
4.0000 mL | Freq: Once | INTRAMUSCULAR | Status: AC
  Administered 2015-02-21: 4 mL

## 2015-02-21 MED ORDER — LIDOCAINE HCL (PF) 1 % IJ SOLN: 10.0000 mL | Freq: Once | INTRAMUSCULAR | Status: DC

## 2015-02-21 MED ORDER — ROPIVACAINE HCL 2 MG/ML IJ SOLN
INTRAMUSCULAR | Status: AC
  Administered 2015-02-21: 4 mL
  Filled 2015-02-21: qty 20

## 2015-02-21 MED ORDER — GABAPENTIN 100 MG PO CAPS: 100.0000 mg | ORAL_CAPSULE | Freq: Three times a day (TID) | ORAL | Status: DC

## 2015-02-21 MED ORDER — METHYLPREDNISOLONE ACETATE 40 MG/ML IJ SUSP
INTRAMUSCULAR | Status: AC
Start: 2015-02-21 — End: 2015-02-21
  Administered 2015-02-21: 40 mg via INTRA_ARTICULAR
  Filled 2015-02-21: qty 1

## 2015-02-21 MED ORDER — LACTATED RINGERS IV SOLN: 1000.0000 mL | INTRAVENOUS | Status: AC

## 2015-02-21 MED ORDER — TRIAMCINOLONE ACETONIDE 40 MG/ML IJ SUSP
40.0000 mg | Freq: Once | INTRAMUSCULAR | Status: AC
  Administered 2015-02-21 (×2): 40 mg

## 2015-02-21 NOTE — Progress Notes (Signed)
Safety precautions to be maintained throughout the outpatient stay will include: orient to surroundings, keep bed in low position, maintain call bell within reach at all times, provide assistance with transfer out of bed and ambulation.  

## 2015-02-21 NOTE — Progress Notes (Signed)
Patient's Name: Stacy Stephenson MRN: [id number] DOB: 1975-05-20 DOS: 02/21/2015  Primary Reason(s) for Visit: Interventional Pain Management Treatment. CC: Back Pain   Pre-Procedure Assessment:  Stacy Stephenson is a 40 y.o. year old, female patient, seen today for interventional treatment. She has DEPRESSION; MIGRAINE, COMMON; HYPERTENSION; GASTROENTERITIS, ACUTE; Anxiety state; Adult hypothyroidism; Avitaminosis D; Abnormal weight gain; Chronic pain; Long term current use of opiate analgesic; Long term prescription opiate use; Opiate use; Encounter for therapeutic drug level monitoring; Encounter for chronic pain management; Failed back surgical syndrome; Chronic low back pain (Location of Primary Source of Pain); Lumbar spondylosis; Lumbar facet syndrome (Bilateral) (R>L); Chronic sacroiliac joint pain (Right); Neurogenic pain; Neuropathic pain; and Muscle spasm on her problem list.. Her primarily concern today is the Back Pain   The patient comes into clinic today indicating that she is having pain on both sides of the lower back and she was to have the injections done bilaterally.  Today's Pain Score: 7  (initially described today as a 7/10) ,clinically she looks like a 3-4/10, before procedure. Reported level of pain is incompatible with clinical obrservations. This may be secondary to a possible lack of understanding on how the pain scale works. Pain Type: Chronic pain Pain Location: Back Pain Orientation: Lower Pain Descriptors / Indicators: Sharp, Aching, Constant Pain Frequency: Constant  Date of Last Visit: 02/11/15 Service Provided on Last Visit: Med Refill  Verification of the correct person, correct site (including marking of site), and correct procedure were performed and confirmed by the patient.  Today's Vitals   02/21/15 1034 02/21/15 1041 02/21/15 1050 02/21/15 1100  BP: 131/88 126/84 131/83 128/86  Pulse: 71 73 75 71  Temp:      Resp: '15 16 16 16  ' Height:      Weight:       SpO2: 92% 100% 100% 100%  PainSc: 7      PainLoc:      Calculated BMI: Body mass index is 26.09 kg/(m^2). Allergies: She is allergic to fluoxetine hcl; venlafaxine; and zolpidem tartrate.. Primary Diagnosis: Neuropathic pain [M79.2]  Procedure # 1:  Type: Diagnostic Medial Branch Facet Block Region: Lumbar Level: L2, L3, L4, L5, & S1 Medial Branch Level(s) Laterality: Bilateral  Procedure # 2: Type: Diagnostic Sacroiliac Joint Block Region: Posterior Lumbosacral Level: PSIS (Posterior Superior Iliac Spine) Sacroiliac Joint Laterality: Bilateral  Indications: 1. Neuropathic pain   2. Lumbar facet syndrome   3. Chronic sacroiliac joint pain (Right)     In addition, Ms. Sultan has Chronic pain; Failed back surgical syndrome; Chronic low back pain (Location of Primary Source of Pain); Lumbar spondylosis; Lumbar facet syndrome (Bilateral) (R>L); Chronic sacroiliac joint pain (Right); Neurogenic pain; Neuropathic pain; and Muscle spasm on her pertinent problem list.  Consent: Secured. Under the influence of no sedatives a written informed consent was obtained, after having provided information on the risks and possible complications. To fulfill our ethical and legal obligations, as recommended by the American Medical Association's Code of Ethics, we have provided information to the patient about our clinical impression; the nature and purpose of the treatment or procedure; the risks, benefits, and possible complications of the intervention; alternatives; the risk(s) and benefit(s) of the alternative treatment(s) or procedure(s); and the risk(s) and benefit(s) of doing nothing. The patient was provided information about the risks and possible complications associated with the procedure. In the case of spinal procedures these may include, but are not limited to, failure to achieve desired goals, infection, bleeding, organ or nerve  damage, allergic reactions, paralysis, and death. In addition,  the patient was informed that Medicine is not an exact science; therefore, there is also the possibility of unforeseen risks and possible complications that may result in a catastrophic outcome. The patient indicated having understood very clearly. We have given the patient no guarantees and we have made no promises. Enough time was given to the patient to ask questions, all of which were answered to the patient's satisfaction.  Pre-Procedure Preparation: Safety Precautions: Allergies reviewed. Appropriate site, procedure, and patient were confirmed by following the Joint Commission's Universal Protocol (UP.01.01.01), in the form of a "Time Out". The patient was asked to confirm marked site and procedure, before commencing. The patient was asked about blood thinners, or active infections, both of which were denied. Patient was assessed for positional comfort and all pressure points were checked before starting procedure. Monitoring:  As per clinic protocol. Infection Control Precautions: Sterile technique used. Standard Universal Precautions were taken as recommended by the Department of [facility] for Disease Control and Prevention (CDC). Standard pre-surgical skin prep was conducted. Respiratory hygiene and cough etiquette was practiced. Hand hygiene observed. Safe injection practices and needle disposal techniques followed. SDV (single dose vial) medications used. Medications properly checked for expiration dates and contaminants. Personal protective equipment (PPE) used: Sterile double glove technique. Radiation resistant gloves. Sterile surgical gloves.  Anesthesia, Analgesia, Anxiolysis: Type: Moderate (Conscious) Sedation & Local Anesthesia Local Anesthetic: Lidocaine 1% Route: Intravenous (IV) IV Access: Secured Sedation: Meaningful verbal contact was maintained at all times during the procedure  Indication(s): Analgesia & Anxiolysis  Description of Procedure #1 Process:  Time-out:  "Time-out" completed before starting procedure, as per protocol. Position: Prone Target Area: For Lumbar Facet blocks, the target is the groove formed by the junction of the transverse process and superior articular process. For the L5 dorsal ramus, the target is the notch between superior articular process and sacral ala. For the S1 dorsal ramus, the target is the superior and lateral edge of the posterior S1 Sacral foramen. Approach: Paramedial approach. Area Prepped: Entire Posterior Lumbosacral Region Prepping solution: ChloraPrep (2% chlorhexidine gluconate and 70% isopropyl alcohol) Safety Precautions: Aspiration looking for blood return was conducted prior to all injections. At no point did we inject any substances, as a needle was being advanced. No attempts were made at seeking any paresthesias. Safe injection practices and needle disposal techniques used. Medications properly checked for expiration dates. SDV (single dose vial) medications used.   Description of the Procedure: Protocol guidelines were followed. The patient was placed in position over the fluoroscopy table. The target area was identified and the area prepped in the usual manner. Skin desensitized using vapocoolant spray. Skin & deeper tissues infiltrated with local anesthetic. Appropriate amount of time allowed to pass for local anesthetics to take effect. The procedure needle was introduced through the skin, ipsilateral to the reported pain, and advanced to the target area. Employing the "Medial Branch Technique", the needles were advanced to the angle made by the superior and medial portion of the transverse process, and the lateral and inferior portion of the superior articulating process of the targeted vertebral bodies. This area is known as "Burton's Eye" or the "Eye of the [place] Dog". A procedure needle was introduced through the skin, and this time advanced to the angle made by the superior and medial border of the sacral  ala, and the lateral border of the S1 vertebral body. This last needle was later repositioned at the superior  and lateral border of the posterior S1 foramen. Negative aspiration confirmed. Solution injected in intermittent fashion, asking for systemic symptoms every 0.5cc of injectate. The needles were then removed and the area cleansed, making sure to leave some of the prepping solution back to take advantage of its long term bactericidal properties. EBL: None Materials & Medications Used:  Needle(s) Used: 22g - 3.5" Spinal Needle(s) Medications Administered today: We administered fentaNYL, midazolam, ropivacaine (PF) 2 mg/ml (0.2%), triamcinolone acetonide, methylPREDNISolone acetate, ropivacaine (PF) 2 mg/ml (0.2%), ropivacaine (PF) 2 mg/ml (0.2%), triamcinolone acetonide, fentaNYL, methylPREDNISolone acetate, midazolam, ropivacaine (PF) 2 mg/ml (0.2%), triamcinolone acetonide, and methylPREDNISolone acetate.Please see chart orders for dosing details.  Description of Procedure # 2 Process:  Time-out: "Time-out" completed before starting procedure, as per protocol. Position: Prone Target Area: For upper sacroiliac joint block(s), the target is the superior and posterior margin of the sacroiliac joint. Approach: Ipsilateral approach. Area Prepped: Entire Posterior Lumbosacral Region Prepping solution: Duraprep (Iodine Povacrylex [0.7% available Iodine] and Isopropyl Alcohol, 74% w/w) Safety Precautions: Aspiration looking for blood return was conducted prior to all injections. At no point did we inject any substances, as a needle was being advanced. No attempts were made at seeking any paresthesias. Safe injection practices and needle disposal techniques used. Medications properly checked for expiration dates. SDV (single dose vial) medications used. Description of the Procedure: Protocol guidelines were followed. The patient was placed in position over the fluoroscopy table. The target area was  identified and the area prepped in the usual manner. Skin desensitized using vapocoolant spray. Skin & deeper tissues infiltrated with local anesthetic. Appropriate amount of time allowed to pass for local anesthetics to take effect. The procedure needle was advanced under fluoroscopic guidance into the sacroiliac joint until a firm endpoint was obtained. Proper needle placement secured. Negative aspiration confirmed. Solution injected in intermittent fashion, asking for systemic symptoms every 0.5cc of injectate. The needles were then removed and the area cleansed, making sure to leave some of the prepping solution back to take advantage of its long term bactericidal properties. EBL: None Materials & Medications Used:  Needle(s) Used: 22g - 3.5" Spinal Needle(s) Medications Administered today: We administered fentaNYL, midazolam, ropivacaine (PF) 2 mg/ml (0.2%), triamcinolone acetonide, methylPREDNISolone acetate, ropivacaine (PF) 2 mg/ml (0.2%), ropivacaine (PF) 2 mg/ml (0.2%), triamcinolone acetonide, fentaNYL, methylPREDNISolone acetate, midazolam, ropivacaine (PF) 2 mg/ml (0.2%), triamcinolone acetonide, and methylPREDNISolone acetate.Please see chart orders for dosing details.  Imaging Guidance:  Type of Imaging Technique: Fluoroscopy Guidance (Spinal) Indication(s): Assistance in needle guidance and placement for procedures requiring needle placement in or near specific anatomical locations not easily accessible without such assistance. Exposure Time: Please see nurses notes. Contrast: None required. Fluoroscopic Guidance: I was personally present in the fluoroscopy suite, where the patient was placed in position for the procedure, over the fluoroscopy-compatible table. Fluoroscopy was manipulated, using "Tunnel Vision Technique", to obtain the best possible view of the target area, on the affected side. Parallax error was corrected before commencing the procedure. A "direction-depth-direction"  technique was used to introduce the needle under continuous pulsed fluoroscopic guidance. Once the target was reached, antero-posterior, oblique, and lateral fluoroscopic projection views were taken to confirm needle placement in all planes. Permanently recorded images stored by scanning into EMR. Interpretation: Intraoperative imaging interpretation by performing Physician. Adequate needle placement confirmed. Adequate needle placement confirmed in AP, lateral, & Oblique Views. No contrast injected.  Antibiotics:  Type:  Antibiotics Given (last 72 hours)    None      Indication(s):  No indications identified.  Post-operative Assessment:  Complications: No immediate post-treatment complications were observed. Disposition: Return to clinic for follow-up evaluation. The patient tolerated the entire procedure well. A repeat set of vitals were taken after the procedure and the patient was kept under observation following institutional policy, for this procedure. Post-procedural neurological assessment was performed, showing return to baseline, prior to discharge. The patient was discharged home, once institutional criteria were met. The patient was provided with post-procedure discharge instructions, including a section on how to identify potential problems. Should any problems arise concerning this procedure, the patient was given instructions to immediately contact us, at any time, without hesitation. In any case, we plan to contact the patient by telephone for a follow-up status report regarding this interventional procedure. Comments:  No additional relevant information.  Primary Care Physician: Cathlean Cower, MD Location: [facility] Note by: Kathlen Brunswick. Dossie Arbour, M.D, DABA, DABAPM, DABPM, DABIPP, FIPP  Disclaimer:  Medicine is not an exact science. The only guarantee in medicine is that nothing is guaranteed. It is important to note that the decision to proceed with  this intervention was based on the information collected from the patient. The Data and conclusions were drawn from the patient's questionnaire, the interview, and the physical examination. Because the information was provided in large part by the patient, it cannot be guaranteed that it has not been purposely or unconsciously manipulated. Every effort has been made to obtain as much relevant data as possible for this evaluation. It is important to note that the conclusions that lead to this procedure are derived in large part from the available data. Always take into account that the treatment will also be dependent on availability of resources and existing treatment guidelines, considered by other Pain Management Practitioners as being common knowledge and practice, at the time of the intervention. For Medico-Legal purposes, it is also important to point out that variation in procedural techniques and pharmacological choices are the acceptable norm. The indications, contraindications, technique, and results of the above procedure should only be interpreted and judged by a Board-Certified Interventional Pain Specialist with extensive familiarity and expertise in the same exact procedure and technique. Attempts at providing opinions without similar or greater experience and expertise than that of the treating physician will be considered as inappropriate and unethical, and shall result in a formal complaint to the state medical board and applicable specialty societies.

## 2015-02-21 NOTE — Patient Instructions (Addendum)
Smoking Cessation, Tips for Success If you are ready to quit smoking, congratulations! You have chosen to help yourself be healthier. Cigarettes bring nicotine, tar, carbon monoxide, and other irritants into your body. Your lungs, heart, and blood vessels will be able to work better without these poisons. There are many different ways to quit smoking. Nicotine gum, nicotine patches, a nicotine inhaler, or nicotine nasal spray can help with physical craving. Hypnosis, support groups, and medicines help break the habit of smoking. WHAT THINGS CAN I DO TO MAKE QUITTING EASIER?  Here are some tips to help you quit for good:  Pick a date when you will quit smoking completely. Tell all of your friends and family about your plan to quit on that date.  Do not try to slowly cut down on the number of cigarettes you are smoking. Pick a quit date and quit smoking completely starting on that day.  Throw away all cigarettes.   Clean and remove all ashtrays from your home, work, and car.  On a card, write down your reasons for quitting. Carry the card with you and read it when you get the urge to smoke.  Cleanse your body of nicotine. Drink enough water and fluids to keep your urine clear or pale yellow. Do this after quitting to flush the nicotine from your body.  Learn to predict your moods. Do not let a bad situation be your excuse to have a cigarette. Some situations in your life might tempt you into wanting a cigarette.  Never have "just one" cigarette. It leads to wanting another and another. Remind yourself of your decision to quit.  Change habits associated with smoking. If you smoked while driving or when feeling stressed, try other activities to replace smoking. Stand up when drinking your coffee. Brush your teeth after eating. Sit in a different chair when you read the paper. Avoid alcohol while trying to quit, and try to drink fewer caffeinated beverages. Alcohol and caffeine may urge you to  smoke.  Avoid foods and drinks that can trigger a desire to smoke, such as sugary or spicy foods and alcohol.  Ask people who smoke not to smoke around you.  Have something planned to do right after eating or having a cup of coffee. For example, plan to take a walk or exercise.  Try a relaxation exercise to calm you down and decrease your stress. Remember, you may be tense and nervous for the first 2 weeks after you quit, but this will pass.  Find new activities to keep your hands busy. Play with a pen, coin, or rubber band. Doodle or draw things on paper.  Brush your teeth right after eating. This will help cut down on the craving for the taste of tobacco after meals. You can also try mouthwash.   Use oral substitutes in place of cigarettes. Try using lemon drops, carrots, cinnamon sticks, or chewing gum. Keep them handy so they are available when you have the urge to smoke.  When you have the urge to smoke, try deep breathing.  Designate your home as a nonsmoking area.  If you are a heavy smoker, ask your health care provider about a prescription for nicotine chewing gum. It can ease your withdrawal from nicotine.  Reward yourself. Set aside the cigarette money you save and buy yourself something nice.  Look for support from others. Join a support group or smoking cessation program. Ask someone at home or at work to help you with your plan   to quit smoking.  Always ask yourself, "Do I need this cigarette or is this just a reflex?" Tell yourself, "Today, I choose not to smoke," or "I do not want to smoke." You are reminding yourself of your decision to quit.  Do not replace cigarette smoking with electronic cigarettes (commonly called e-cigarettes). The safety of e-cigarettes is unknown, and some may contain harmful chemicals.  If you relapse, do not give up! Plan ahead and think about what you will do the next time you get the urge to smoke. HOW WILL I FEEL WHEN I QUIT SMOKING? You  may have symptoms of withdrawal because your body is used to nicotine (the addictive substance in cigarettes). You may crave cigarettes, be irritable, feel very hungry, cough often, get headaches, or have difficulty concentrating. The withdrawal symptoms are only temporary. They are strongest when you first quit but will go away within 10-14 days. When withdrawal symptoms occur, stay in control. Think about your reasons for quitting. Remind yourself that these are signs that your body is healing and getting used to being without cigarettes. Remember that withdrawal symptoms are easier to treat than the major diseases that smoking can cause.  Even after the withdrawal is over, expect periodic urges to smoke. However, these cravings are generally short lived and will go away whether you smoke or not. Do not smoke! WHAT RESOURCES ARE AVAILABLE TO HELP ME QUIT SMOKING? Your health care provider can direct you to community resources or hospitals for support, which may include:  Group support.  Education.  Hypnosis.  Therapy.   This information is not intended to replace advice given to you by your health care provider. Make sure you discuss any questions you have with your health care provider.   Document Released: 10/18/2003 Document Revised: 02/09/2014 Document Reviewed: 07/07/2012 Elsevier Interactive Patient Education 2016 [employer] Pain Management Discharge Instructions  General Discharge Instructions :  If you need to reach your doctor call: Monday-Friday 8:00 am - 4:00 pm at [phone] or toll free [phone].  After clinic hours [phone] to have operator reach doctor.  Bring all of your medication bottles to all your appointments in the pain clinic.  To cancel or reschedule your appointment with Pain Management please remember to call 24 hours in advance to avoid a fee.  Refer to the educational materials which you have been given on: General Risks, I had my Procedure.  Discharge Instructions, Post Sedation.  Post Procedure Instructions:  The drugs you were given will stay in your system until tomorrow, so for the next 24 hours you should not drive, make any legal decisions or drink any alcoholic beverages.  You may eat anything you prefer, but it is better to start with liquids then soups and crackers, and gradually work up to solid foods.  Please notify your doctor immediately if you have any unusual bleeding, trouble breathing or pain that is not related to your normal pain.  Depending on the type of procedure that was done, some parts of your body may feel week and/or numb.  This usually clears up by tonight or the next day.  Walk with the use of an assistive device or accompanied by an adult for the 24 hours.  You may use ice on the affected area for the first 24 hours.  Put ice in a Ziploc bag and cover with a towel and place against area 15 minutes on 15 minutes off.  You may switch to heat after 24 hours.

## 2015-02-22 ENCOUNTER — Telehealth: Payer: Self-pay | Admitting: *Deleted

## 2015-02-22 NOTE — Telephone Encounter (Signed)
Spoke with patient, verbalizes that she is having pain at the site but has no complications from procedure. Encouraged to give the medicine time to work, use ice or heat as needed and prescribed pain medication if needed.

## 2015-03-14 ENCOUNTER — Encounter (INDEPENDENT_AMBULATORY_CARE_PROVIDER_SITE_OTHER): Payer: Self-pay

## 2015-03-14 ENCOUNTER — Ambulatory Visit: Payer: Managed Care, Other (non HMO) | Attending: Pain Medicine | Admitting: Pain Medicine

## 2015-03-14 ENCOUNTER — Encounter: Payer: Self-pay | Admitting: Pain Medicine

## 2015-03-14 VITALS — BP 148/83 | HR 73 | Temp 98.0°F | Resp 18 | Ht 61.5 in | Wt 142.0 lb

## 2015-03-14 DIAGNOSIS — M545 Low back pain, unspecified: Secondary | ICD-10-CM

## 2015-03-14 DIAGNOSIS — F329 Major depressive disorder, single episode, unspecified: Secondary | ICD-10-CM | POA: Insufficient documentation

## 2015-03-14 DIAGNOSIS — Z79891 Long term (current) use of opiate analgesic: Secondary | ICD-10-CM | POA: Diagnosis not present

## 2015-03-14 DIAGNOSIS — Z5181 Encounter for therapeutic drug level monitoring: Secondary | ICD-10-CM | POA: Diagnosis not present

## 2015-03-14 DIAGNOSIS — F1190 Opioid use, unspecified, uncomplicated: Secondary | ICD-10-CM | POA: Insufficient documentation

## 2015-03-14 DIAGNOSIS — M533 Sacrococcygeal disorders, not elsewhere classified: Secondary | ICD-10-CM | POA: Insufficient documentation

## 2015-03-14 DIAGNOSIS — I10 Essential (primary) hypertension: Secondary | ICD-10-CM | POA: Insufficient documentation

## 2015-03-14 DIAGNOSIS — M797 Fibromyalgia: Secondary | ICD-10-CM | POA: Insufficient documentation

## 2015-03-14 DIAGNOSIS — F17210 Nicotine dependence, cigarettes, uncomplicated: Secondary | ICD-10-CM | POA: Diagnosis not present

## 2015-03-14 DIAGNOSIS — E559 Vitamin D deficiency, unspecified: Secondary | ICD-10-CM | POA: Insufficient documentation

## 2015-03-14 DIAGNOSIS — M47816 Spondylosis without myelopathy or radiculopathy, lumbar region: Secondary | ICD-10-CM | POA: Diagnosis not present

## 2015-03-14 DIAGNOSIS — M549 Dorsalgia, unspecified: Secondary | ICD-10-CM | POA: Diagnosis present

## 2015-03-14 DIAGNOSIS — G8929 Other chronic pain: Secondary | ICD-10-CM | POA: Diagnosis not present

## 2015-03-14 DIAGNOSIS — E039 Hypothyroidism, unspecified: Secondary | ICD-10-CM | POA: Diagnosis not present

## 2015-03-14 DIAGNOSIS — G43909 Migraine, unspecified, not intractable, without status migrainosus: Secondary | ICD-10-CM | POA: Insufficient documentation

## 2015-03-14 MED ORDER — OXYCODONE HCL 5 MG PO TABS: 5.0000 mg | ORAL_TABLET | Freq: Three times a day (TID) | ORAL | Status: DC | PRN

## 2015-03-14 NOTE — Progress Notes (Signed)
Patient's Name: Stacy Stephenson MRN: [id number] DOB: [birth year] DOS: 03/14/2015  Primary Reason(s) for Visit: Post-Procedure evaluation CC: Back Pain   HPI  Ms. Rochell is a 40 y.o. year old, female patient, who returns today as an established patient. She has DEPRESSION; MIGRAINE, COMMON; HYPERTENSION; GASTROENTERITIS, ACUTE; Anxiety state; Adult hypothyroidism; Avitaminosis D; Abnormal weight gain; Chronic pain; Long term current use of opiate analgesic; Long term prescription opiate use; Opiate use; Encounter for therapeutic drug level monitoring; Encounter for chronic pain management; Failed back surgical syndrome; Chronic low back pain (Location of Primary Source of Pain); Lumbar spondylosis; Lumbar facet syndrome (Bilateral) (R>L); Chronic sacroiliac joint pain (Right); Neurogenic pain; Neuropathic pain; and Muscle spasm on her problem list.. Her primarily concern today is the Back Pain   The patient returns to the clinics today after having had a bilateral diagnostic lumbar facet block and a diagnostic bilateral sacroiliac joint block under fluoroscopic guidance and IV sedation on 02/21/2015.  The patient also reports that the pharmacy apparently lost one of her prescriptions. We did call the pharmacy and confirmed that this was the case and we have replaced one of her prescriptions. In addition, he has previously decreased the amount of pain medicine that she was taking to two tablets per day. Today she indicates that she feels she may need 3 instead of 2. Will go up by 1.   Reported Pain Score: 2  Reported level is compatible with observation Pain Type: Chronic pain Pain Location: Back Pain Orientation: Lower Pain Descriptors / Indicators: Aching, Constant, Dull Pain Frequency: Constant  Date of Last Visit: 02/21/15 Service Provided on Last Visit: Procedure  Pharmacotherapy  Medication(s): Oxycodone 5 mg 1 tablet by mouth twice a day. Onset of action: Within expected  pharmacological parameters Time to Peak effect: Timing and results are as within normal expected parameters Analgesic Effect: More than 50% Activity Facilitation: Medication(s) allow patient to sit, stand, walk, and do the basic ADLs Perceived Effectiveness: Described as relatively effective, allowing for increase in activities of daily living (ADL) Side-effects or Adverse reactions: None reported Duration of action: Within normal limits for medication [place] PMP: Compliant with practice rules and regulations UDS Results: The patient's last UDS was done on 02/11/2015 and was reported as having an unexpected result since oxycodone was not present. However, some of its metabolites were present in a formal oxymorphone. Since she has gone down on the amount of pain medicine that she takes, it is entirely possible that this is within normal limits. UDS Interpretation: Patient appears to be compliant with practice rules and regulations Medication Assessment Form: Reviewed. Patient indicates being compliant with therapy Treatment compliance: Compliant Substance Use Disorder (SUD) Risk Level: Low Pharmacologic Plan: Will go back up on the oxycodone to 3 times a day instead of 2.  Lab Work: Illicit Drugs No results found for: THCU, COCAINSCRNUR, PCPSCRNUR, MDMA, AMPHETMU, METHADONE, ETOH  Inflammation Markers No results found for: ESRSEDRATE, CRP  Renal Function Lab Results  Component Value Date   BUN 20* 03/29/2013   CREATININE 1.15 03/29/2013   GFRAA >60 03/29/2013   GFRNONAA >60 03/29/2013    Hepatic Function Lab Results  Component Value Date   AST 26 02/12/2012   ALT 30 02/12/2012   ALBUMIN 4.4 02/12/2012    Electrolytes Lab Results  Component Value Date   NA 137 03/29/2013   K 3.9 03/29/2013   CL 106 03/29/2013   CALCIUM 9.6 03/29/2013    Post-Procedure Assessment  Procedure done on last visit:  The patient returns to the clinics today after having had a bilateral diagnostic  lumbar facet block and a diagnostic bilateral sacroiliac joint block under fluoroscopic guidance and IV sedation on 02/21/2015. Side-effects or Adverse reactions: None reported Sedation: Procedure was performed with sedation  Results: Ultra-Short Term Relief (First 1 hour after procedure): 25 %  Possibly the results is influenced by the pharmacodynamic effect of the local anesthetic, as well as that of the intravenous analgesics and/or sedatives, when used Short Term Relief (Initial 4-6 hrs after procedure): 25 % Short-term relief confirms injected site to be the source of pain Long Term Relief : 80 % (pain shooting into buttocks is gone- lower bilateral sides ) Long-term benefit would suggest an inflammatory etiology to the pain   Current Relief (Now):  80 %  Persistent relief would suggest effective anti-inflammatory effects from steroids Interpretation of Results: Clearly she has been getting some benefit from the steroids. However, the fact that she is only getting 25% relief of the pain for the duration of local anesthetic would argue against the pain actually coming from the lumbar facets and or the SI joint. The pain may be coming from the middle compartment of the spine between the lower thoracic in the upper lumbar region. However, she indicates having no pain in the groin area or lower abdominal area which would argue against an upper lumbar or a lower thoracic radiculopathy. In addition, she refers that the pain seems to be between her legs but not in a dermatomal pattern but more  Allergies  Ms. Farone is allergic to fluoxetine hcl; venlafaxine; zolpidem tartrate; and zolpidem tartrate.  Meds  The patient has a current medication list which includes the following prescription(s): clonazepam, cyclobenzaprine, gabapentin, gabapentin, hydrochlorothiazide, ibuprofen, levothyroxine, levothyroxine, magnesium oxide, metoprolol tartrate, ondansetron, oxycodone, oxycodone, oxycodone,  varenicline, and vitamin d (ergocalciferol).  Current Outpatient Prescriptions on File Prior to Visit  Medication Sig  . clonazePAM (KLONOPIN) 2 MG tablet TAKE 1 TABLET BY MOUTH AT BEDTIME AS NEEDED  . cyclobenzaprine (FLEXERIL) 10 MG tablet Take 1 tablet (10 mg total) by mouth at bedtime.  . gabapentin (NEURONTIN) 100 MG capsule Take 1-3 capsules (100-300 mg total) by mouth 3 (three) times daily. Follow titration schedule.  . hydrochlorothiazide (HYDRODIURIL) 25 MG tablet Take 25 mg by mouth every other day. Mon, tues, wed, thurs, fri  . ibuprofen (ADVIL,MOTRIN) 800 MG tablet Take 800 mg by mouth every 8 (eight) hours as needed.   [name] levothyroxine (SYNTHROID, LEVOTHROID) 25 MCG tablet Take 25 mcg by mouth daily before breakfast. saturday  . levothyroxine (SYNTHROID, LEVOTHROID) 50 MCG tablet Take 50 mcg by mouth daily before breakfast. Monday - Friday  . magnesium oxide (MAG-OX) 400 MG tablet Take 400 mg by mouth daily.   . metoprolol tartrate (LOPRESSOR) 25 MG tablet Take 50 mg by mouth at bedtime.   . ondansetron (ZOFRAN-ODT) 8 MG disintegrating tablet Take by mouth every 8 (eight) hours as needed.   [name] oxyCODONE (OXY IR/ROXICODONE) 5 MG immediate release tablet Take 1 tablet (5 mg total) by mouth 2 (two) times daily as needed for moderate pain or severe pain.  [name] oxyCODONE (OXY IR/ROXICODONE) 5 MG immediate release tablet Take 1 tablet (5 mg total) by mouth 2 (two) times daily as needed for moderate pain or severe pain.  . varenicline (CHANTIX) 1 MG tablet Take 1 mg by mouth 2 (two) times daily.   . Vitamin D, Ergocalciferol, (DRISDOL) 50000 units CAPS capsule Take 50,000 Units by mouth every  7 (seven) days.    No current facility-administered medications on file prior to visit.    ROS  Constitutional: Afebrile, no chills, well hydrated and well nourished Gastrointestinal: negative Musculoskeletal:negative Neurological: negative Behavioral/Psych: negative  PFSH  Medical:  Ms. Attebery   has a past medical history of Hypothyroidism; Depression; Migraines; Hypertension; Fibromyalgia; and Cancer ([place]). Family: family history includes Arthritis in her mother; Diabetes in her father; Heart disease in her father; Stroke in her father. Surgical:  has past surgical history that includes Breast surgery and Microdiscectomy lumbar. Tobacco:  reports that she has been smoking Cigarettes.  She has a 12 pack-year smoking history. She does not have any smokeless tobacco history on file. Alcohol:  reports that she drinks alcohol. Drug:  reports that she does not use illicit drugs.  Physical Exam  Vitals:  Today's Vitals   03/14/15 1457 03/14/15 1500  BP: 148/83   Pulse: 73   Temp: 98 F (36.7 C)   TempSrc: Oral   Resp: 18   Height: 5' 1.5" (1.562 m)   Weight: 142 lb (64.411 kg)   SpO2: 100%   PainSc:  2     Calculated BMI: Body mass index is 26.4 kg/(m^2).  General appearance: alert, cooperative, appears stated age and no distress Eyes: PERLA Respiratory: No evidence respiratory distress, no audible rales or ronchi and no use of accessory muscles of respiration  Cervical Spine Inspection: Normal anatomy Alignment: Symetrical ROM: Adequate  Upper Extremities Inspection: No gross anomalies detected ROM: Adequate Sensory: Normal Motor: Unremarkable  Thoracic Spine Inspection: No gross anomalies detected Alignment: Symetrical ROM: Adequate Palpation: WNL  Lumbar Spine Inspection: No gross anomalies detected Alignment: Symetrical ROM: Decreased Palpation: Tender Provocative Tests:  Lumbar Hyperextension and rotation test:  deferred Patrick's Maneuver: deferred Gait: WNL  Lower Extremities Inspection: No gross anomalies detected ROM: Adequate Sensory:  Normal Motor: Unremarkable  Assessment & Plan  Primary Diagnosis & Pertinent Problem List: The primary encounter diagnosis was Chronic pain. Diagnoses of Encounter for therapeutic drug level monitoring, Long  term current use of opiate analgesic, Chronic low back pain (Location of Primary Source of Pain), Chronic sacroiliac joint pain (Right), Lumbar facet syndrome (Bilateral) (R>L), and Lumbar spondylosis, unspecified spinal osteoarthritis were also pertinent to this visit.  Visit Diagnosis: 1. Chronic pain   2. Encounter for therapeutic drug level monitoring   3. Long term current use of opiate analgesic   4. Chronic low back pain (Location of Primary Source of Pain)   5. Chronic sacroiliac joint pain (Right)   6. Lumbar facet syndrome (Bilateral) (R>L)   7. Lumbar spondylosis, unspecified spinal osteoarthritis     Assessment: No problem-specific assessment & plan notes found for this encounter.   Plan of Care  Pharmacotherapy (Medications Ordered): Meds ordered this encounter  Medications  . oxyCODONE (OXY IR/ROXICODONE) 5 MG immediate release tablet    Sig: Take 1 tablet (5 mg total) by mouth every 8 (eight) hours as needed for moderate pain or severe pain.    Dispense:  90 tablet    Refill:  0    Do not place this medication, or any other prescription from our practice, on "Automatic Refill". Patient may have prescription filled one day early if pharmacy is closed on scheduled refill date. Do not fill until: 04/09/15 To last until: 05/09/15    [facility] & Procedure Ordered: No orders of the defined types were placed in this encounter.    Imaging Ordered: None  Interventional Therapies: Scheduled: None at this  time. PRN Procedures: None at this time.    Referral(s) or Consult(s): None at this time.  Medications administered during this visit: Ms. Czap had no medications administered during this visit.  Future Appointments Date Time Provider [place]  05/08/2015 8:20 AM Milinda Pointer, MD [facility] None    Primary Care Physician: Cathlean Cower, MD Location: [facility] Note by: Kathlen Brunswick. Dossie Arbour, M.D, DABA, DABAPM, DABPM,  DABIPP, FIPP

## 2015-03-14 NOTE — Progress Notes (Signed)
Safety precautions to be maintained throughout the outpatient stay will include: orient to surroundings, keep bed in low position, maintain call bell within reach at all times, provide assistance with transfer out of bed and ambulation.  

## 2015-03-14 NOTE — Progress Notes (Signed)
Quick Note:  NOTE: This forensic urine drug screen (UDS) test was conducted using a state-of-the-art ultra high performance liquid chromatography and mass spectrometry system (UPLC/MS-MS), the most sophisticated and accurate method available. UPLC/MS-MS is 1,000 times more precise and accurate than standard gas chromatography and mass spectrometry (GC/MS). This system can analyze 26 drug categories and 180 drug compounds.  The results of this test came back with unexpected findings. No oxycodone found on the sample. ______

## 2015-05-08 ENCOUNTER — Encounter: Payer: Self-pay | Admitting: Pain Medicine

## 2015-05-08 ENCOUNTER — Ambulatory Visit: Payer: Managed Care, Other (non HMO) | Attending: Pain Medicine | Admitting: Pain Medicine

## 2015-05-08 VITALS — BP 124/89 | HR 73 | Resp 16 | Ht 61.0 in | Wt 145.0 lb

## 2015-05-08 DIAGNOSIS — E039 Hypothyroidism, unspecified: Secondary | ICD-10-CM | POA: Diagnosis not present

## 2015-05-08 DIAGNOSIS — M62838 Other muscle spasm: Secondary | ICD-10-CM | POA: Diagnosis not present

## 2015-05-08 DIAGNOSIS — Z79891 Long term (current) use of opiate analgesic: Secondary | ICD-10-CM | POA: Insufficient documentation

## 2015-05-08 DIAGNOSIS — M545 Low back pain, unspecified: Secondary | ICD-10-CM

## 2015-05-08 DIAGNOSIS — G43909 Migraine, unspecified, not intractable, without status migrainosus: Secondary | ICD-10-CM | POA: Insufficient documentation

## 2015-05-08 DIAGNOSIS — M533 Sacrococcygeal disorders, not elsewhere classified: Secondary | ICD-10-CM | POA: Diagnosis not present

## 2015-05-08 DIAGNOSIS — Z5181 Encounter for therapeutic drug level monitoring: Secondary | ICD-10-CM | POA: Diagnosis not present

## 2015-05-08 DIAGNOSIS — Z87891 Personal history of nicotine dependence: Secondary | ICD-10-CM | POA: Diagnosis not present

## 2015-05-08 DIAGNOSIS — F419 Anxiety disorder, unspecified: Secondary | ICD-10-CM | POA: Insufficient documentation

## 2015-05-08 DIAGNOSIS — F1190 Opioid use, unspecified, uncomplicated: Secondary | ICD-10-CM

## 2015-05-08 DIAGNOSIS — M5481 Occipital neuralgia: Secondary | ICD-10-CM | POA: Diagnosis not present

## 2015-05-08 DIAGNOSIS — G8929 Other chronic pain: Secondary | ICD-10-CM | POA: Insufficient documentation

## 2015-05-08 DIAGNOSIS — I10 Essential (primary) hypertension: Secondary | ICD-10-CM | POA: Insufficient documentation

## 2015-05-08 DIAGNOSIS — R519 Headache, unspecified: Secondary | ICD-10-CM | POA: Insufficient documentation

## 2015-05-08 DIAGNOSIS — F329 Major depressive disorder, single episode, unspecified: Secondary | ICD-10-CM | POA: Insufficient documentation

## 2015-05-08 DIAGNOSIS — R51 Headache: Secondary | ICD-10-CM

## 2015-05-08 DIAGNOSIS — M549 Dorsalgia, unspecified: Secondary | ICD-10-CM | POA: Diagnosis present

## 2015-05-08 DIAGNOSIS — K529 Noninfective gastroenteritis and colitis, unspecified: Secondary | ICD-10-CM | POA: Insufficient documentation

## 2015-05-08 MED ORDER — OXYCODONE HCL 5 MG PO TABS: 5.0000 mg | ORAL_TABLET | Freq: Two times a day (BID) | ORAL | Status: DC | PRN

## 2015-05-08 MED ORDER — CYCLOBENZAPRINE HCL 10 MG PO TABS: 10.0000 mg | ORAL_TABLET | Freq: Every day | ORAL | Status: DC

## 2015-05-08 MED ORDER — METHOCARBAMOL 500 MG PO TABS: 500.0000 mg | ORAL_TABLET | Freq: Two times a day (BID) | ORAL | Status: DC | PRN

## 2015-05-08 MED ORDER — OXYCODONE HCL 5 MG PO TABS: 5.0000 mg | ORAL_TABLET | Freq: Three times a day (TID) | ORAL | Status: DC | PRN

## 2015-05-08 NOTE — Progress Notes (Signed)
Safety precautions to be maintained throughout the outpatient stay will include: orient to surroundings, keep bed in low position, maintain call bell within reach at all times, provide assistance with transfer out of bed and ambulation. Did not bring pills for pill count 

## 2015-05-08 NOTE — Patient Instructions (Signed)
GENERAL RISKS AND COMPLICATIONS  What are the risk, side effects and possible complications? Generally speaking, most procedures are safe.  However, with any procedure there are risks, side effects, and the possibility of complications.  The risks and complications are dependent upon the sites that are lesioned, or the type of nerve block to be performed.  The closer the procedure is to the spine, the more serious the risks are.  Great care is taken when placing the radio frequency needles, block needles or lesioning probes, but sometimes complications can occur. 1. Infection: Any time there is an injection through the skin, there is a risk of infection.  This is why sterile conditions are used for these blocks.  There are four possible types of infection. 1. Localized skin infection. 2. Central Nervous System Infection-This can be in the form of Meningitis, which can be deadly. 3. Epidural Infections-This can be in the form of an epidural abscess, which can cause pressure inside of the spine, causing compression of the spinal cord with subsequent paralysis. This would require an emergency surgery to decompress, and there are no guarantees that the patient would recover from the paralysis. 4. Discitis-This is an infection of the intervertebral discs.  It occurs in about 1% of discography procedures.  It is difficult to treat and it may lead to surgery.        2. Pain: the needles have to go through skin and soft tissues, will cause soreness.       3. Damage to internal structures:  The nerves to be lesioned may be near blood vessels or    other nerves which can be potentially damaged.       4. Bleeding: Bleeding is more common if the patient is taking blood thinners such as  aspirin, Coumadin, Ticiid, Plavix, etc., or if he/she have some genetic predisposition  such as hemophilia. Bleeding into the spinal canal can cause compression of the spinal  cord with subsequent paralysis.  This would require an  emergency surgery to  decompress and there are no guarantees that the patient would recover from the  paralysis.       5. Pneumothorax:  Puncturing of a lung is a possibility, every time a needle is introduced in  the area of the chest or upper back.  Pneumothorax refers to free air around the  collapsed lung(s), inside of the thoracic cavity (chest cavity).  Another two possible  complications related to a similar event would include: Hemothorax and Chylothorax.   These are variations of the Pneumothorax, where instead of air around the collapsed  lung(s), you may have blood or chyle, respectively.       6. Spinal headaches: They may occur with any procedures in the area of the spine.       7. Persistent CSF (Cerebro-Spinal Fluid) leakage: This is a rare problem, but may occur  with prolonged intrathecal or epidural catheters either due to the formation of a fistulous  track or a dural tear.       8. Nerve damage: By working so close to the spinal cord, there is always a possibility of  nerve damage, which could be as serious as a permanent spinal cord injury with  paralysis.       9. Death:  Although rare, severe deadly allergic reactions known as "Anaphylactic  reaction" can occur to any of the medications used.      10. Worsening of the symptoms:  We can always make thing worse.    What are the chances of something like this happening? Chances of any of this occuring are extremely low.  By statistics, you have more of a chance of getting killed in a motor vehicle accident: while driving to the hospital than any of the above occurring .  Nevertheless, you should be aware that they are possibilities.  In general, it is similar to taking a shower.  Everybody knows that you can slip, hit your head and get killed.  Does that mean that you should not shower again?  Nevertheless always keep in mind that statistics do not mean anything if you happen to be on the wrong side of them.  Even if a procedure has a 1  (one) in a 1,000,000 (million) chance of going wrong, it you happen to be that one..Also, keep in mind that by statistics, you have more of a chance of having something go wrong when taking medications.  Who should not have this procedure? If you are on a blood thinning medication (e.g. Coumadin, Plavix, see list of "Blood Thinners"), or if you have an active infection going on, you should not have the procedure.  If you are taking any blood thinners, please inform your physician.  How should I prepare for this procedure?  Do not eat or drink anything at least six hours prior to the procedure.  Bring a driver with you .  It cannot be a taxi.  Come accompanied by an adult that can drive you back, and that is strong enough to help you if your legs get weak or numb from the local anesthetic.  Take all of your medicines the morning of the procedure with just enough water to swallow them.  If you have diabetes, make sure that you are scheduled to have your procedure done first thing in the morning, whenever possible.  If you have diabetes, take only half of your insulin dose and notify our nurse that you have done so as soon as you arrive at the clinic.  If you are diabetic, but only take blood sugar pills (oral hypoglycemic), then do not take them on the morning of your procedure.  You may take them after you have had the procedure.  Do not take aspirin or any aspirin-containing medications, at least eleven (11) days prior to the procedure.  They may prolong bleeding.  Wear loose fitting clothing that may be easy to take off and that you would not mind if it got stained with Betadine or blood.  Do not wear any jewelry or perfume  Remove any nail coloring.  It will interfere with some of our monitoring equipment.  NOTE: Remember that this is not meant to be interpreted as a complete list of all possible complications.  Unforeseen problems may occur.  BLOOD THINNERS The following drugs  contain aspirin or other products, which can cause increased bleeding during surgery and should not be taken for 2 weeks prior to and 1 week after surgery.  If you should need take something for relief of minor pain, you may take acetaminophen which is found in Tylenol,m Datril, Anacin-3 and Panadol. It is not blood thinner. The products listed below are.  Do not take any of the products listed below in addition to any listed on your instruction sheet.  A.P.C or A.P.C with Codeine Codeine Phosphate Capsules #3 Ibuprofen Ridaura  ABC compound Congesprin Imuran rimadil  Advil Cope Indocin Robaxisal  Alka-Seltzer Effervescent Pain Reliever and Antacid Coricidin or Coricidin-D  Indomethacin Rufen    Alka-Seltzer plus Cold Medicine Cosprin Ketoprofen S-A-C Tablets  Anacin Analgesic Tablets or Capsules Coumadin Korlgesic Salflex  Anacin Extra Strength Analgesic tablets or capsules CP-2 Tablets Lanoril Salicylate  Anaprox Cuprimine Capsules Levenox Salocol  Anexsia-D Dalteparin Magan Salsalate  Anodynos Darvon compound Magnesium Salicylate Sine-off  Ansaid Dasin Capsules Magsal Sodium Salicylate  Anturane Depen Capsules Marnal Soma  APF Arthritis pain formula Dewitt's Pills Measurin Stanback  Argesic Dia-Gesic Meclofenamic Sulfinpyrazone  Arthritis Bayer Timed Release Aspirin Diclofenac Meclomen Sulindac  Arthritis pain formula Anacin Dicumarol Medipren Supac  Analgesic (Safety coated) Arthralgen Diffunasal Mefanamic Suprofen  Arthritis Strength Bufferin Dihydrocodeine Mepro Compound Suprol  Arthropan liquid Dopirydamole Methcarbomol with Aspirin Synalgos  ASA tablets/Enseals Disalcid Micrainin Tagament  Ascriptin Doan's Midol Talwin  Ascriptin A/D Dolene Mobidin Tanderil  Ascriptin Extra Strength Dolobid Moblgesic Ticlid  Ascriptin with Codeine Doloprin or Doloprin with Codeine Momentum Tolectin  Asperbuf Duoprin Mono-gesic Trendar  Aspergum Duradyne Motrin or Motrin IB Triminicin  Aspirin  plain, buffered or enteric coated Durasal Myochrisine Trigesic  Aspirin Suppositories Easprin Nalfon Trillsate  Aspirin with Codeine Ecotrin Regular or Extra Strength Naprosyn Uracel  Atromid-S Efficin Naproxen Ursinus  Auranofin Capsules Elmiron Neocylate Vanquish  Axotal Emagrin Norgesic Verin  Azathioprine Empirin or Empirin with Codeine Normiflo Vitamin E  Azolid Emprazil Nuprin Voltaren  Bayer Aspirin plain, buffered or children's or timed BC Tablets or powders Encaprin Orgaran Warfarin Sodium  Buff-a-Comp Enoxaparin Orudis Zorpin  Buff-a-Comp with Codeine Equegesic Os-Cal-Gesic   Buffaprin Excedrin plain, buffered or Extra Strength Oxalid   Bufferin Arthritis Strength Feldene Oxphenbutazone   Bufferin plain or Extra Strength Feldene Capsules Oxycodone with Aspirin   Bufferin with Codeine Fenoprofen Fenoprofen Pabalate or Pabalate-SF   Buffets II Flogesic Panagesic   Buffinol plain or Extra Strength Florinal or Florinal with Codeine Panwarfarin   Buf-Tabs Flurbiprofen Penicillamine   Butalbital Compound Four-way cold tablets Penicillin   Butazolidin Fragmin Pepto-Bismol   Carbenicillin Geminisyn Percodan   Carna Arthritis Reliever Geopen Persantine   Carprofen Gold's salt Persistin   Chloramphenicol Goody's Phenylbutazone   Chloromycetin Haltrain Piroxlcam   Clmetidine heparin Plaquenil   Cllnoril Hyco-pap Ponstel   Clofibrate Hydroxy chloroquine Propoxyphen         Before stopping any of these medications, be sure to consult the physician who ordered them.  Some, such as Coumadin (Warfarin) are ordered to prevent or treat serious conditions such as "deep thrombosis", "pumonary embolisms", and other heart problems.  The amount of time that you may need off of the medication may also vary with the medication and the reason for which you were taking it.  If you are taking any of these medications, please make sure you notify your pain physician before you undergo any  procedures.         Facet Blocks Patient Information  Description: The facets are joints in the spine between the vertebrae.  Like any joints in the body, facets can become irritated and painful.  Arthritis can also effect the facets.  By injecting steroids and local anesthetic in and around these joints, we can temporarily block the nerve supply to them.  Steroids act directly on irritated nerves and tissues to reduce selling and inflammation which often leads to decreased pain.  Facet blocks may be done anywhere along the spine from the neck to the low back depending upon the location of your pain.   After numbing the skin with local anesthetic (like Novocaine), a small needle is passed onto the facet joints under x-ray guidance.    You may experience a sensation of pressure while this is being done.  The entire block usually lasts about 15-25 minutes.   Conditions which may be treated by facet blocks:   Low back/buttock pain  Neck/shoulder pain  Certain types of headaches  Preparation for the injection:  1. Do not eat any solid food or dairy products within 8 hours of your appointment. 2. You may drink clear liquid up to 3 hours before appointment.  Clear liquids include water, black coffee, juice or soda.  No milk or cream please. 3. You may take your regular medication, including pain medications, with a sip of water before your appointment.  Diabetics should hold regular insulin (if taken separately) and take 1/2 normal NPH dose the morning of the procedure.  Carry some sugar containing items with you to your appointment. 4. A driver must accompany you and be prepared to drive you home after your procedure. 5. Bring all your current medications with you. 6. An IV may be inserted and sedation may be given at the discretion of the physician. 7. A blood pressure cuff, EKG and other monitors will often be applied during the procedure.  Some patients may need to have extra oxygen  administered for a short period. 8. You will be asked to provide medical information, including your allergies and medications, prior to the procedure.  We must know immediately if you are taking blood thinners (like Coumadin/Warfarin) or if you are allergic to IV iodine contrast (dye).  We must know if you could possible be pregnant.  Possible side-effects:   Bleeding from needle site  Infection (rare, may require surgery)  Nerve injury (rare)  Numbness & tingling (temporary)  Difficulty urinating (rare, temporary)  Spinal headache (a headache worse with upright posture)  Light-headedness (temporary)  Pain at injection site (serveral days)  Decreased blood pressure (rare, temporary)  Weakness in arm/leg (temporary)  Pressure sensation in back/neck (temporary)   Call if you experience:   Fever/chills associated with headache or increased back/neck pain  Headache worsened by an upright position  New onset, weakness or numbness of an extremity below the injection site  Hives or difficulty breathing (go to the emergency room)  Inflammation or drainage at the injection site(s)  Severe back/neck pain greater than usual  New symptoms which are concerning to you  Please note:  Although the local anesthetic injected can often make your back or neck feel good for several hours after the injection, the pain will likely return. It takes 3-7 days for steroids to work.  You may not notice any pain relief for at least one week.  If effective, we will often do a series of 2-3 injections spaced 3-6 weeks apart to maximally decrease your pain.  After the initial series, you may be a candidate for a more permanent nerve block of the facets.  If you have any questions, please call [phone] [facility] 

## 2015-05-08 NOTE — Progress Notes (Signed)
Patient's Name: Stacy Stephenson Patient type: Established  MRN: [id number] Service setting: Ambulatory outpatient  DOB: [birth year]   DOS: 05/08/2015    Primary Reason(s) for Visit: Encounter for prescription drug management (Level of risk: moderate) CC: Back Pain   HPI  Ms. Delval is a 40 y.o. year old, female patient, who returns today as an established patient. She has DEPRESSION; MIGRAINE, COMMON; HYPERTENSION; GASTROENTERITIS, ACUTE; Anxiety state; Adult hypothyroidism; Avitaminosis D; Abnormal weight gain; Chronic pain; Long term current use of opiate analgesic; Long term prescription opiate use; Opiate use (22.5 MME/Day); Encounter for therapeutic drug level monitoring; Encounter for chronic pain management; Failed back surgical syndrome; Chronic low back pain (Location of Primary Source of Pain); Lumbar spondylosis; Lumbar facet syndrome (Bilateral) (R>L); Chronic sacroiliac joint pain (Right); Neurogenic pain; Neuropathic pain; Muscle spasm; Occipital headache; and Bilateral occipital neuralgia on her problem list.. Her primarily concern today is the Back Pain   Pain Assessment: Self-Reported Pain Score: 4  Reported level is compatible with observation Pain Type: Chronic pain Pain Location: Back Pain Orientation: Lower Pain Descriptors / Indicators: Aching, Constant, Dull Pain Frequency: Constant  The patient comes in today clinics today for pharmacological management of her chronic pain.The patient seems to be doing okay with her medication regimen. Day gabapentin still making her very tired. She indicated that she is taking it when she feels tight, as if it was a muscle relaxant. Because of this, I have offered a trial of Robaxin 500 mg to be taken 1 by mouth twice a day for that same purpose. We'll continue with the Flexeril at bedtime since it does help her sleep.  Today the patient talked to me about her migraines which as it turns out they seem to be occipital headaches secondary to  tension. Evaluation would suggest that she is having bilateral greater occipital neuralgia that at times may be so debilitating that she has to lose work. Today I have offered her the option to do some diagnostic bilateral greater occipital nerve blocks whenever this is happening. If in fact it does help her headaches, but we can consider either radiofrequency or cryo-analgesia.  Today she indicates that some of her low back pain is beginning to come back and therefore we will schedule her for a repeat bilateral lumbar facet block under fluoroscopic guidance and IV sedation. She indicates that the injection done in January is beginning to wear off. She wants to have that one repeated which is the lumbar facet block.  Date of Last Visit: 03/14/15 Service Provided on Last Visit: Evaluation  Controlled Substance Pharmacotherapy Assessment  Analgesic: Oxycodone IR 5 mg by mouth twice a day (15 mg/day) Pill Count: The patient did not bring her pills to her appointment. Today I have given her a final warning with regards to this. MME/day: 22.5 mg/day.  Pharmacokinetics: Onset of action (Liberation/Absorption): Within expected pharmacological parameters Time to Peak effect (Distribution): Timing and results are as within normal expected parameters Duration of action (Metabolism/Excretion): Within normal limits for medication Pharmacodynamics: Analgesic Effect: More than 50% Activity Facilitation: Medication(s) allow patient to sit, stand, walk, and do the basic ADLs Perceived Effectiveness: Described as relatively effective, allowing for increase in activities of daily living (ADL) Side-effects or Adverse reactions: None reported Monitoring: [place] PMP: Online review of the past 44-month period conducted. Compliant with practice rules and regulations UDS Results/interpretation: The patient's last UDS was done on 02/11/2015 and it came back abnormal due to the absence of oxycodone. It did show oxymorphone  as a possible metabolite suggesting the possibility that the patient may be a rapid metabolizer. Medication Assessment Form: Reviewed. Patient indicates being compliant with therapy Treatment compliance: Compliant Risk Assessment: Aberrant Behavior: None observed today Substance Use Disorder (SUD) Risk Level: Low Risk of opioid abuse or dependence: 0.7-3.0% with doses ? 36 MME/day and 6.1-26% with doses ? 120 MME/day. Opioid Risk Tool (ORT) Score:  2 Low Risk for SUD (Score <3) Depression Scale Score: PHQ-2: PHQ-2 Total Score: 0 No depression (0) PHQ-9: PHQ-9 Total Score: 0 No depression (0-4)  Pharmacologic Plan: No change in therapy, at this time  Laboratory Chemistry  Inflammation Markers No results found for: ESRSEDRATE, CRP  Renal Function Lab Results  Component Value Date   BUN 20* 03/29/2013   CREATININE 1.15 03/29/2013   GFRAA >60 03/29/2013   GFRNONAA >60 03/29/2013    Hepatic Function Lab Results  Component Value Date   AST 26 02/12/2012   ALT 30 02/12/2012   ALBUMIN 4.4 02/12/2012    Electrolytes Lab Results  Component Value Date   NA 137 03/29/2013   K 3.9 03/29/2013   CL 106 03/29/2013   CALCIUM 9.6 03/29/2013    Pain Modulating Vitamins No results found for: Staples, VD125OH2TOT, [id number], [id number], VITAMINB12  Coagulation Parameters No results found for: INR, LABPROT  Note: I personally reviewed the above data. Results shared with patient.  Meds  The patient has a current medication list which includes the following prescription(s): clonazepam, cyclobenzaprine, gabapentin, gabapentin, hydrochlorothiazide, ibuprofen, levothyroxine, levothyroxine, magnesium oxide, metoprolol tartrate, ondansetron, oxycodone, oxycodone, oxycodone, vitamin d (ergocalciferol), and methocarbamol.  Current Outpatient Prescriptions on File Prior to Visit  Medication Sig  . clonazePAM (KLONOPIN) 2 MG tablet TAKE 1 TABLET BY MOUTH AT BEDTIME AS NEEDED  . gabapentin  (NEURONTIN) 100 MG capsule Take 1-3 capsules (100-300 mg total) by mouth 3 (three) times daily. Follow titration schedule.  . gabapentin (NEURONTIN) 800 MG tablet TAKE 1 TABLET (800 MG TOTAL) BY MOUTH AT BEDTIME.  . hydrochlorothiazide (HYDRODIURIL) 25 MG tablet Take 25 mg by mouth every other day. Mon, tues, wed, thurs, fri  . ibuprofen (ADVIL,MOTRIN) 800 MG tablet Take 800 mg by mouth every 8 (eight) hours as needed.   [name] levothyroxine (SYNTHROID, LEVOTHROID) 25 MCG tablet Take 25 mcg by mouth daily before breakfast. saturday  . levothyroxine (SYNTHROID, LEVOTHROID) 50 MCG tablet Take 50 mcg by mouth daily before breakfast. Monday - Friday  . magnesium oxide (MAG-OX) 400 MG tablet Take 400 mg by mouth daily.   . metoprolol tartrate (LOPRESSOR) 25 MG tablet Take 50 mg by mouth at bedtime.   . ondansetron (ZOFRAN-ODT) 8 MG disintegrating tablet Take by mouth every 8 (eight) hours as needed.   . Vitamin D, Ergocalciferol, (DRISDOL) 50000 units CAPS capsule Take 50,000 Units by mouth every 7 (seven) days.    No current facility-administered medications on file prior to visit.    ROS  Constitutional: Afebrile, no chills, well hydrated and well nourished Gastrointestinal: No upper or lower GI bleeding, no nausea, no vomiting and no acute GI distress Musculoskeletal: No acute joint swelling or redness, no acute loss of range of motion and no acute onset weakness Neurological: Denies any acute onset apraxia, no episodes of paralysis, no acute loss of coordination, no acute loss of consciousness and no acute onset aphasia, dysarthria, agnosia, or amnesia  Allergies  Ms. Amuso is allergic to fluoxetine hcl; venlafaxine; zolpidem tartrate; and zolpidem tartrate.  [place]  Medical:  Ms. Lorden  has a past  medical history of Hypothyroidism; Depression; Migraines; Hypertension; Fibromyalgia; and Cancer ([place]). Family: family history includes Arthritis in her mother; Diabetes in her father; Heart disease in  her father; Stroke in her father. Surgical:  has past surgical history that includes Breast surgery and Microdiscectomy lumbar. Tobacco:  reports that she has quit smoking. Her smoking use included Cigarettes. She has a 12 pack-year smoking history. She does not have any smokeless tobacco history on file. Alcohol:  reports that she drinks alcohol. Drug:  reports that she does not use illicit drugs.  Physical Examination  Constitutional Vitals:  Today's Vitals   05/08/15 0859 05/08/15 0903  BP: 124/89   Pulse: 73   Resp: 16   Height: 5\' 1"  (1.549 m)   Weight: 145 lb (65.772 kg)   SpO2: 98%   PainSc: 4  4   PainLoc: Back    Calculated BMI: Body mass index is 27.41 kg/(m^2).    General appearance: alert, cooperative, appears stated age and no distress Eyes: PERLA Respiratory: No evidence respiratory distress, no audible rales or ronchi and no use of accessory muscles of respiration   Cervical Spine Exam  Inspection: Normal anatomy, no anomalies observed Cervical Lordosis: Normal Alignment: Symetrical Functional ROM: Within functional limits (WFL) AROM: WFL Sensory: No sensory abnormalities reported  Upper Extremity Exam   Right  Left  Inspection: No gross anomalies detected Functional ROM: Within functional limits [facility])  Inspection: No gross anomalies detected Functional ROM: Within functional limits (WFL)  AROM: Adequate  AROM: Adequate  Sensory: Normal  Sensory: Normal  Motor: Unremarkable       Motor: Unremarkable        Thoracic Spine  Inspection: No gross anomalies detected Alignment: Symetrical AROM: Adequate  Lumbar Spne  Inspection: No gross anomalies detected Lumbar Lordosis: Normal Sacral Kyphosis: Normal Alignment: Symetrical Palpation: WNL AROM: Adequate Provocative Tests:  Lumbar Hyperextension and rotation test:  Positive for facet pain, bilaterally Patrick's Maneuver: deferred  Gait Assessment  Gait: WNL  Lower Extremities   Right  Left   Inspection: No gross anomalies detected Functional ROM: Within functional limits [facility])  Inspection: No gross anomalies detected Functional ROM: Within functional limits (WFL)  AROM: Adequate  AROM: Adequate  Sensory:  Normal  Sensory:  Normal  Motor: Unremarkable       Motor: Unremarkable        Assessment & Plan  Primary Diagnosis & Pertinent Problem List: The primary encounter diagnosis was Chronic pain. Diagnoses of Encounter for therapeutic drug level monitoring, Long term current use of opiate analgesic, Chronic low back pain (Location of Primary Source of Pain), Muscle spasm, Occipital headache, Bilateral occipital neuralgia, and Opiate use (22.5 MME/Day) were also pertinent to this visit.  Visit Diagnosis: 1. Chronic pain   2. Encounter for therapeutic drug level monitoring   3. Long term current use of opiate analgesic   4. Chronic low back pain (Location of Primary Source of Pain)   5. Muscle spasm   6. Occipital headache   7. Bilateral occipital neuralgia   8. Opiate use (22.5 MME/Day)     Problem-specific Plan(s): No problem-specific assessment & plan notes found for this encounter.   Plan of Care  Pharmacotherapy (Medications Ordered): Meds ordered this encounter  Medications  . DISCONTD: oxyCODONE (OXY IR/ROXICODONE) 5 MG immediate release tablet    Sig: Take 1 tablet (5 mg total) by mouth 2 (two) times daily as needed for severe pain.    Dispense:  60 tablet    Refill:  0    Do not place this medication, or any other prescription from our practice, on "Automatic Refill". Patient may have prescription filled one day early if pharmacy is closed on scheduled refill date. Do not fill until: 05/09/15 To last until: 06/08/15  . DISCONTD: oxyCODONE (OXY IR/ROXICODONE) 5 MG immediate release tablet    Sig: Take 1 tablet (5 mg total) by mouth 2 (two) times daily as needed for severe pain.    Dispense:  60 tablet    Refill:  0    Do not place this medication, or any  other prescription from our practice, on "Automatic Refill". Patient may have prescription filled one day early if pharmacy is closed on scheduled refill date. Do not fill until: 06/08/15 To last until: 07/08/15  . DISCONTD: oxyCODONE (OXY IR/ROXICODONE) 5 MG immediate release tablet    Sig: Take 1 tablet (5 mg total) by mouth 2 (two) times daily as needed for severe pain.    Dispense:  60 tablet    Refill:  0    Do not place this medication, or any other prescription from our practice, on "Automatic Refill". Patient may have prescription filled one day early if pharmacy is closed on scheduled refill date. Do not fill until: 07/08/15 To last until: 08/07/15  . cyclobenzaprine (FLEXERIL) 10 MG tablet    Sig: Take 1 tablet (10 mg total) by mouth at bedtime.    Dispense:  30 tablet    Refill:  2    Do not place this medication, or any other prescription from our practice, on "Automatic Refill". Patient may have prescription filled one day early if pharmacy is closed on scheduled refill date.  . methocarbamol (ROBAXIN) 500 MG tablet    Sig: Take 1 tablet (500 mg total) by mouth 2 (two) times daily as needed for muscle spasms.    Dispense:  60 tablet    Refill:  1    Do not place this medication, or any other prescription from our practice, on "Automatic Refill". Patient may have prescription filled one day early if pharmacy is closed on scheduled refill date.  [name] oxyCODONE (OXY IR/ROXICODONE) 5 MG immediate release tablet    Sig: Take 1 tablet (5 mg total) by mouth every 8 (eight) hours as needed for severe pain.    Dispense:  90 tablet    Refill:  0    Do not place this medication, or any other prescription from our practice, on "Automatic Refill". Patient may have prescription filled one day early if pharmacy is closed on scheduled refill date. Do not fill until: 05/09/15 To last until: 06/08/15  . oxyCODONE (OXY IR/ROXICODONE) 5 MG immediate release tablet    Sig: Take 1 tablet (5 mg total)  by mouth every 8 (eight) hours as needed for severe pain.    Dispense:  90 tablet    Refill:  0    Do not place this medication, or any other prescription from our practice, on "Automatic Refill". Patient may have prescription filled one day early if pharmacy is closed on scheduled refill date. Do not fill until: 06/08/15 To last until: 07/08/15  . oxyCODONE (OXY IR/ROXICODONE) 5 MG immediate release tablet    Sig: Take 1 tablet (5 mg total) by mouth every 8 (eight) hours as needed for severe pain.    Dispense:  90 tablet    Refill:  0    Do not place this medication, or any other prescription from our practice, on "Automatic  Refill". Patient may have prescription filled one day early if pharmacy is closed on scheduled refill date. Do not fill until: 07/08/15 To last until: 08/07/15    [facility] & Procedure Ordered: Orders Placed This Encounter  Procedures  . GREATER OCCIPITAL NERVE BLOCK  . ToxASSURE Select 13 (MW), Urine    Imaging Ordered: None  Interventional Therapies: Scheduled:  Palliative bilateral lumbar facet block under fluoroscopic guidance and IV sedation.    Considering:  Diagnostic bilateral greater occipital nerve block under fluoroscopic guidance and IV sedation.    PRN Procedures:   1. Palliative bilateral lumbar facet block under fluoroscopic guidance and IV sedation. The patient did not get a significant amount of relief for the duration of the local anesthetic, but she did get 80% benefit of her low back pain from the injection suggesting that it can still be use to control some of her low back pain.  2. Diagnostic bilateral greater occipital nerve block under fluoroscopic guidance and IV sedation for the headache.    Referral(s) or Consult(s): None at this time.  New Prescriptions   METHOCARBAMOL (ROBAXIN) 500 MG TABLET    Take 1 tablet (500 mg total) by mouth 2 (two) times daily as needed for muscle spasms.    Medications administered during this  visit: Ms. Upright had no medications administered during this visit.  Future Appointments Date Time Provider [place]  08/07/2015 9:00 AM Milinda Pointer, MD [facility] None    Primary Care Physician: Cathlean Cower, MD Location: [facility] Note by: Kathlen Brunswick. Dossie Arbour, M.D, DABA, DABAPM, DABPM, DABIPP, FIPP  Pain Score Disclaimer: We use the NRS-11 scale. This is a self-reported, subjective measurement of pain severity with only modest accuracy. It is used primarily to identify changes within a particular patient. It must be understood that outpatient pain scales are significantly less accurate that those used for research, where they can be applied under ideal controlled circumstances with minimal exposure to variables. In reality, the score is likely to be a combination of pain intensity and pain affect, where pain affect describes the degree of emotional arousal or changes in action readiness caused by the sensory experience of pain. Factors such as social and work situation, setting, emotional state, anxiety levels, expectation, and prior pain experience may influence pain perception and show large inter-individual differences that may also be affected by time variables.

## 2015-05-16 LAB — TOXASSURE SELECT 13 (MW), URINE: PDF: 0

## 2015-05-23 ENCOUNTER — Ambulatory Visit: Payer: Managed Care, Other (non HMO) | Admitting: Pain Medicine

## 2015-05-30 ENCOUNTER — Encounter: Payer: Self-pay | Admitting: Pain Medicine

## 2015-05-30 ENCOUNTER — Ambulatory Visit: Payer: Managed Care, Other (non HMO) | Attending: Pain Medicine | Admitting: Pain Medicine

## 2015-05-30 VITALS — BP 148/73 | HR 65 | Temp 98.6°F | Resp 18 | Ht 61.0 in | Wt 147.0 lb

## 2015-05-30 DIAGNOSIS — G43009 Migraine without aura, not intractable, without status migrainosus: Secondary | ICD-10-CM | POA: Diagnosis not present

## 2015-05-30 DIAGNOSIS — F329 Major depressive disorder, single episode, unspecified: Secondary | ICD-10-CM | POA: Diagnosis not present

## 2015-05-30 DIAGNOSIS — E039 Hypothyroidism, unspecified: Secondary | ICD-10-CM | POA: Insufficient documentation

## 2015-05-30 DIAGNOSIS — F419 Anxiety disorder, unspecified: Secondary | ICD-10-CM | POA: Diagnosis not present

## 2015-05-30 DIAGNOSIS — M533 Sacrococcygeal disorders, not elsewhere classified: Secondary | ICD-10-CM | POA: Diagnosis not present

## 2015-05-30 DIAGNOSIS — M545 Low back pain, unspecified: Secondary | ICD-10-CM

## 2015-05-30 DIAGNOSIS — I10 Essential (primary) hypertension: Secondary | ICD-10-CM | POA: Diagnosis not present

## 2015-05-30 DIAGNOSIS — E559 Vitamin D deficiency, unspecified: Secondary | ICD-10-CM | POA: Diagnosis not present

## 2015-05-30 DIAGNOSIS — N920 Excessive and frequent menstruation with regular cycle: Secondary | ICD-10-CM | POA: Diagnosis not present

## 2015-05-30 DIAGNOSIS — G8929 Other chronic pain: Secondary | ICD-10-CM | POA: Diagnosis not present

## 2015-05-30 DIAGNOSIS — M5481 Occipital neuralgia: Secondary | ICD-10-CM | POA: Diagnosis not present

## 2015-05-30 DIAGNOSIS — R51 Headache: Secondary | ICD-10-CM | POA: Diagnosis not present

## 2015-05-30 DIAGNOSIS — R635 Abnormal weight gain: Secondary | ICD-10-CM | POA: Insufficient documentation

## 2015-05-30 DIAGNOSIS — K529 Noninfective gastroenteritis and colitis, unspecified: Secondary | ICD-10-CM | POA: Diagnosis not present

## 2015-05-30 DIAGNOSIS — N921 Excessive and frequent menstruation with irregular cycle: Secondary | ICD-10-CM | POA: Insufficient documentation

## 2015-05-30 DIAGNOSIS — M47816 Spondylosis without myelopathy or radiculopathy, lumbar region: Secondary | ICD-10-CM | POA: Insufficient documentation

## 2015-05-30 DIAGNOSIS — M549 Dorsalgia, unspecified: Secondary | ICD-10-CM | POA: Diagnosis present

## 2015-05-30 DIAGNOSIS — Z79891 Long term (current) use of opiate analgesic: Secondary | ICD-10-CM | POA: Diagnosis not present

## 2015-05-30 MED ORDER — FENTANYL CITRATE (PF) 100 MCG/2ML IJ SOLN
INTRAMUSCULAR | Status: AC
  Administered 2015-05-30: 100 ug via INTRAVENOUS
  Filled 2015-05-30: qty 2

## 2015-05-30 MED ORDER — LACTATED RINGERS IV SOLN: 1000.0000 mL | Freq: Once | INTRAVENOUS | Status: DC

## 2015-05-30 MED ORDER — LIDOCAINE HCL (PF) 1 % IJ SOLN: 10.0000 mL | Freq: Once | INTRAMUSCULAR | Status: DC

## 2015-05-30 MED ORDER — TRIAMCINOLONE ACETONIDE 40 MG/ML IJ SUSP: 40.0000 mg | Freq: Once | INTRAMUSCULAR | Status: DC

## 2015-05-30 MED ORDER — MIDAZOLAM HCL 5 MG/5ML IJ SOLN
INTRAMUSCULAR | Status: AC
  Administered 2015-05-30: 5 mg via INTRAVENOUS
  Filled 2015-05-30: qty 5

## 2015-05-30 MED ORDER — ROPIVACAINE HCL 2 MG/ML IJ SOLN: 9.0000 mL | Freq: Once | INTRAMUSCULAR | Status: DC

## 2015-05-30 MED ORDER — MIDAZOLAM BOLUS VIA INFUSION
1.0000 mg | INTRAVENOUS | Status: DC | PRN
  Filled 2015-05-30: qty 2

## 2015-05-30 MED ORDER — TRIAMCINOLONE ACETONIDE 40 MG/ML IJ SUSP
INTRAMUSCULAR | Status: AC
  Administered 2015-05-30: 12:00:00
  Filled 2015-05-30: qty 2

## 2015-05-30 MED ORDER — ROPIVACAINE HCL 2 MG/ML IJ SOLN
INTRAMUSCULAR | Status: AC
  Administered 2015-05-30: 12:00:00
  Filled 2015-05-30: qty 20

## 2015-05-30 MED ORDER — FENTANYL BOLUS VIA INFUSION
25.0000 ug | INTRAVENOUS | Status: DC | PRN
  Filled 2015-05-30: qty 50

## 2015-05-30 NOTE — Progress Notes (Signed)
Patient's Name: Stacy Stephenson  Patient type: Established  MRN: [id number]  Service setting: Ambulatory outpatient  DOB: 11-25-1975  Location: [facility]  DOS: 05/30/2015  Primary Care Physician: Cathlean Cower, MD  Note by: Kathlen Brunswick. Dossie Arbour, M.D, DABA, DABAPM, DABPM, DABIPP, [place]  Referring Physician: Biagio Borg, MD  Specialty: Board-Certified Interventional Pain Management     Primary Reason(s) for Visit: Interventional Pain Management Treatment. CC: Back Pain  Primary Diagnosis: Facet syndrome, lumbar [M54.5]   Procedure:  Anesthesia, Analgesia, Anxiolysis:  Type: Diagnostic Medial Branch Facet Block Region: Lumbar Level: L2, L3, L4, L5, & S1 Medial Branch Level(s) Laterality: Bilateral  Indications: 1. Lumbar facet syndrome (Bilateral) (R>L)   2. Lumbar spondylosis, unspecified spinal osteoarthritis   3. Chronic low back pain (Location of Primary Source of Pain) (Bilateral) (R>L)     Pre-procedure Pain Score: 5/10 Reported level of pain is compatible with clinical observations Post-procedure Pain Score: 1   Type: Moderate (Conscious) Sedation & Local Anesthesia Local Anesthetic: Lidocaine 1% Route: Intravenous (IV) IV Access: Secured Sedation: Meaningful verbal contact was maintained at all times during the procedure  Indication(s): Analgesia & Anxiolysis   Pre-Procedure Assessment:  Stacy Stephenson is a 40 y.o. year old, female patient, seen today for interventional treatment. She has DEPRESSION; MIGRAINE, COMMON; HYPERTENSION; GASTROENTERITIS, ACUTE; Anxiety state; Adult hypothyroidism; Avitaminosis D; Abnormal weight gain; Chronic pain; Long term current use of opiate analgesic; Long term prescription opiate use; Opiate use (22.5 MME/Day); Encounter for therapeutic drug level monitoring; Encounter for chronic pain management; Failed back surgical syndrome; Chronic low back pain (Location of Primary Source of Pain) (Bilateral) (R>L); Lumbar  spondylosis; Lumbar facet syndrome (Bilateral) (R>L); Chronic sacroiliac joint pain (Right); Neurogenic pain; Neuropathic pain; Muscle spasm; Occipital headache; Bilateral occipital neuralgia; and Excessive and frequent menstruation with irregular cycle on her problem list.. Her primarily concern today is the Back Pain   Pain Type: Chronic pain Pain Location: Back Pain Orientation: Lower Pain Descriptors / Indicators: Aching, Constant, Dull Pain Frequency: Constant  Date of Last Visit: 05/08/15 Service Provided on Last Visit: Med Refill  Verification of the correct person, correct site (including marking of site), and correct procedure were performed and confirmed by the patient.  Consent: Secured. Under the influence of no sedatives a written informed consent was obtained, after having provided information on the risks and possible complications. To fulfill our ethical and legal obligations, as recommended by the American Medical Association's Code of Ethics, we have provided information to the patient about our clinical impression; the nature and purpose of the treatment or procedure; the risks, benefits, and possible complications of the intervention; alternatives; the risk(s) and benefit(s) of the alternative treatment(s) or procedure(s); and the risk(s) and benefit(s) of doing nothing. The patient was provided information about the risks and possible complications associated with the procedure. These include, but are not limited to, failure to achieve desired goals, infection, bleeding, organ or nerve damage, allergic reactions, paralysis, and death. In the case of spinal procedures these may include, but are not limited to, failure to achieve desired goals, infection, bleeding, organ or nerve damage, allergic reactions, paralysis, and death. In addition, the patient was informed that Medicine is not an exact science; therefore, there is also the possibility of unforeseen risks and possible  complications that may result in a catastrophic outcome. The patient indicated having understood very clearly. We have given the patient no guarantees and we have made no promises. Enough time was given to the  patient to ask questions, all of which were answered to the patient's satisfaction.  Consent Attestation: I, the ordering provider, attest that I have discussed with the patient the benefits, risks, side-effects, alternatives, likelihood of achieving goals, and potential problems during recovery for the procedure that I have provided informed consent.  Pre-Procedure Preparation: Safety Precautions: Allergies reviewed. Appropriate site, procedure, and patient were confirmed by following the Joint Commission's Universal Protocol (UP.01.01.01), in the form of a "Time Out". The patient was asked to confirm marked site and procedure, before commencing. The patient was asked about blood thinners, or active infections, both of which were denied. Patient was assessed for positional comfort and all pressure points were checked before starting procedure. Allergies: She is allergic to fluoxetine hcl; venlafaxine; zolpidem tartrate; and zolpidem tartrate.. Infection Control Precautions: Sterile technique used. Standard Universal Precautions were taken as recommended by the Department of [facility] for Disease Control and Prevention (CDC). Standard pre-surgical skin prep was conducted. Respiratory hygiene and cough etiquette was practiced. Hand hygiene observed. Safe injection practices and needle disposal techniques followed. SDV (single dose vial) medications used. Medications properly checked for expiration dates and contaminants. Personal protective equipment (PPE) used: Sterile Radiation-resistant gloves. Monitoring:  As per clinic protocol. Filed Vitals:   05/30/15 1218 05/30/15 1225 05/30/15 1236 05/30/15 1244  BP: 127/82 156/73 151/80 148/73  Pulse:      Temp:  98.6 F (37 C)    TempSrc:   Tympanic    Resp: _0 Height:      Weight:      SpO2: 99% 100% 99% 99%  Calculated BMI: Body mass index is 27.79 kg/(m^2).  Description of Procedure Process:   Time-out: "Time-out" completed before starting procedure, as per protocol. Position: Prone Target Area: For Lumbar Facet blocks, the target is the groove formed by the junction of the transverse process and superior articular process. For the L5 dorsal ramus, the target is the notch between superior articular process and sacral ala. For the S1 dorsal ramus, the target is the superior and lateral edge of the posterior S1 Sacral foramen. Approach: Paramedial approach. Area Prepped: Entire Posterior Lumbosacral Region Prepping solution: ChloraPrep (2% chlorhexidine gluconate and 70% isopropyl alcohol) Safety Precautions: Aspiration looking for blood return was conducted prior to all injections. At no point did we inject any substances, as a needle was being advanced. No attempts were made at seeking any paresthesias. Safe injection practices and needle disposal techniques used. Medications properly checked for expiration dates. SDV (single dose vial) medications used.   Description of the Procedure: Protocol guidelines were followed. The patient was placed in position over the fluoroscopy table. The target area was identified and the area prepped in the usual manner. Skin desensitized using vapocoolant spray. Skin & deeper tissues infiltrated with local anesthetic. Appropriate amount of time allowed to pass for local anesthetics to take effect. The procedure needle was introduced through the skin, ipsilateral to the reported pain, and advanced to the target area. Employing the "Medial Branch Technique", the needles were advanced to the angle made by the superior and medial portion of the transverse process, and the lateral and inferior portion of the superior articulating process of the targeted vertebral bodies. This area is known as  "Burton's Eye" or the "Eye of the [place] Dog". A procedure needle was introduced through the skin, and this time advanced to the angle made by the superior and medial border of the sacral ala, and the lateral border of  the S1 vertebral body. This last needle was later repositioned at the superior and lateral border of the posterior S1 foramen. Negative aspiration confirmed. Solution injected in intermittent fashion, asking for systemic symptoms every 0.5cc of injectate. The needles were then removed and the area cleansed, making sure to leave some of the prepping solution back to take advantage of its long term bactericidal properties. EBL: None Materials & Medications Used:  Needle(s) Used: 22g - 3.5" Spinal Needle(s)  Imaging Guidance:   Type of Imaging Technique: Fluoroscopy Guidance (Spinal) Indication(s): Assistance in needle guidance and placement for procedures requiring needle placement in or near specific anatomical locations not easily accessible without such assistance. Exposure Time: Please see nurses notes. Contrast: None required. Fluoroscopic Guidance: I was personally present in the fluoroscopy suite, where the patient was placed in position for the procedure, over the fluoroscopy-compatible table. Fluoroscopy was manipulated, using "Tunnel Vision Technique", to obtain the best possible view of the target area, on the affected side. Parallax error was corrected before commencing the procedure. A "direction-depth-direction" technique was used to introduce the needle under continuous pulsed fluoroscopic guidance. Once the target was reached, antero-posterior, oblique, and lateral fluoroscopic projection views were taken to confirm needle placement in all planes. Permanently recorded images stored by scanning into EMR. Interpretation: Intraoperative imaging interpretation by performing Physician. Adequate needle placement confirmed. Adequate needle placement confirmed in AP, lateral, &  Oblique Views. No contrast injected.  Antibiotic Prophylaxis:  Indication(s): No indications identified. Type:  Antibiotics Given (last 72 hours)    None       Post-operative Assessment:   Complications: No immediate post-treatment complications were observed. Disposition: Return to clinic for follow-up evaluation. The patient tolerated the entire procedure well. A repeat set of vitals were taken after the procedure and the patient was kept under observation following institutional policy, for this procedure. Post-procedural neurological assessment was performed, showing return to baseline, prior to discharge. The patient was discharged home, once institutional criteria were met. The patient was provided with post-procedure discharge instructions, including a section on how to identify potential problems. Should any problems arise concerning this procedure, the patient was given instructions to immediately contact us, at any time, without hesitation. In any case, we plan to contact the patient by telephone for a follow-up status report regarding this interventional procedure. Comments:  No additional relevant information.  Medications administered during this visit: We administered ropivacaine (PF) 2 mg/ml (0.2%), triamcinolone acetonide, fentaNYL, and midazolam.  Prescriptions ordered during this visit: New Prescriptions   No medications on file    Future Appointments Date Time Provider [place]  06/20/2015 2:00 PM Milinda Pointer, MD [facility] None  08/07/2015 9:00 AM Milinda Pointer, MD [facility] None    Primary Care Physician: Cathlean Cower, MD Location: [facility] Note by: Kathlen Brunswick. Dossie Arbour, M.D, DABA, DABAPM, DABPM, DABIPP, FIPP   Illustration of the posterior view of the lumbar spine and the posterior neural structures. Laminae of L2 through S1 are labeled. DPRL5, dorsal primary ramus of L5; DPRS1, dorsal primary ramus of S1; DPR3,  dorsal primary ramus of L3; FJ, facet (zygapophyseal) joint L3-L4; I, inferior articular process of L4; LB1, lateral branch of dorsal primary ramus of L1; IAB, inferior articular branches from L3 medial branch (supplies L4-L5 facet joint); IBP, intermediate branch plexus; MB3, medial branch of dorsal primary ramus of L3; NR3, third lumbar nerve root; S, superior articular process of L5; SAB, superior articular branches from L4 (supplies L4-5 facet joint also); TP3,  transverse process of L3.  Disclaimer:  Medicine is not an Chief Strategy Officer. The only guarantee in medicine is that nothing is guaranteed. It is important to note that the decision to proceed with this intervention was based on the information collected from the patient. The Data and conclusions were drawn from the patient's questionnaire, the interview, and the physical examination. Because the information was provided in large part by the patient, it cannot be guaranteed that it has not been purposely or unconsciously manipulated. Every effort has been made to obtain as much relevant data as possible for this evaluation. It is important to note that the conclusions that lead to this procedure are derived in large part from the available data. Always take into account that the treatment will also be dependent on availability of resources and existing treatment guidelines, considered by other Pain Management Practitioners as being common knowledge and practice, at the time of the intervention. For Medico-Legal purposes, it is also important to point out that variation in procedural techniques and pharmacological choices are the acceptable norm. The indications, contraindications, technique, and results of the above procedure should only be interpreted and judged by a Board-Certified Interventional Pain Specialist with extensive familiarity and expertise in the same exact procedure and technique. Attempts at providing opinions without similar or greater  experience and expertise than that of the treating physician will be considered as inappropriate and unethical, and shall result in a formal complaint to the state medical board and applicable specialty societies.

## 2015-05-30 NOTE — Progress Notes (Signed)
Safety precautions to be maintained throughout the outpatient stay will include: orient to surroundings, keep bed in low position, maintain call bell within reach at all times, provide assistance with transfer out of bed and ambulation.  

## 2015-05-30 NOTE — Patient Instructions (Signed)
Facet Joint Block The facet joints connect the bones of the spine (vertebrae). They make it possible for you to bend, twist, and make other movements with your spine. They also prevent you from overbending, overtwisting, and making other excessive movements.  A facet joint block is a procedure where a numbing medicine (anesthetic) is injected into a facet joint. Often, a type of anti-inflammatory medicine called a steroid is also injected. A facet joint block may be done for two reasons:   Diagnosis. A facet joint block may be done as a test to see whether neck or back pain is caused by a worn-down or infected facet joint. If the pain gets better after a facet joint block, it means the pain is probably coming from the facet joint. If the pain does not get better, it means the pain is probably not coming from the facet joint.   Therapy. A facet joint block may be done to relieve neck or back pain caused by a facet joint. A facet joint block is only done as a therapy if the pain does not improve with medicine, exercise programs, physical therapy, and other forms of pain management. LET YOUR HEALTH CARE PROVIDER KNOW ABOUT:   Any allergies you have.   All medicines you are taking, including vitamins, herbs, eyedrops, and over-the-counter medicines and creams.   Previous problems you or members of your family have had with the use of anesthetics.   Any blood disorders you have had.   Other health problems you have. RISKS AND COMPLICATIONS Generally, having a facet joint block is safe. However, as with any procedure, complications can occur. Possible complications associated with having a facet joint block include:   Bleeding.   Injury to a nerve near the injection site.   Pain at the injection site.   Weakness or numbness in areas controlled by nerves near the injection site.   Infection.   Temporary fluid retention.   Allergic reaction to anesthetics or medicines used during  the procedure. BEFORE THE PROCEDURE   Follow your health care provider's instructions if you are taking dietary supplements or medicines. You may need to stop taking them or reduce your dosage.   Do not take any new dietary supplements or medicines without asking your health care provider first.   Follow your health care provider's instructions about eating and drinking before the procedure. You may need to stop eating and drinking several hours before the procedure.   Arrange to have an adult drive you home after the procedure. PROCEDURE  You may need to remove your clothing and dress in an open-back gown so that your health care provider can access your spine.   The procedure will be done while you are lying on an X-ray table. Most of the time you will be asked to lie on your stomach, but you may be asked to lie in a different position if an injection will be made in your neck.   Special machines will be used to monitor your oxygen levels, heart rate, and blood pressure.   If an injection will be made in your neck, an intravenous (IV) tube will be inserted into one of your veins. Fluids and medicine will flow directly into your body through the IV tube.   The area over the facet joint where the injection will be made will be cleaned with an antiseptic soap. The surrounding skin will be covered with sterile drapes.   An anesthetic will be applied to your skin   to make the injection area numb. You may feel a temporary stinging or burning sensation.   A video X-ray machine will be used to locate the joint. A contrast dye may be injected into the facet joint area to help with locating the joint.   When the joint is located, an anesthetic medicine will be injected into the joint through the needle.   Your health care provider will ask you whether you feel pain relief. If you do feel relief, a steroid may be injected to provide pain relief for a longer period of time. If you do not  feel relief or feel only partial relief, additional injections of an anesthetic may be made in other facet joints.   The needle will be removed, the skin will be cleansed, and bandages will be applied.  AFTER THE PROCEDURE   You will be observed for 15-30 minutes before being allowed to go home. Do not drive. Have an adult drive you or take a taxi or public transportation instead.   If you feel pain relief, the pain will return in several hours or days when the anesthetic wears off.   You may feel pain relief 2-14 days after the procedure. The amount of time this relief lasts varies from person to person.   It is normal to feel some tenderness over the injected area(s) for 2 days following the procedure.   If you have diabetes, you may have a temporary increase in blood sugar.   This information is not intended to replace advice given to you by your health care provider. Make sure you discuss any questions you have with your health care provider.   Document Released: 06/10/2006 Document Revised: 02/09/2014 Document Reviewed: 11/09/2011 Elsevier Interactive Patient Education 2016 [employer] Pain Management Discharge Instructions  General Discharge Instructions :  If you need to reach your doctor call: Monday-Friday 8:00 am - 4:00 pm at [phone] or toll free [phone].  After clinic hours [phone] to have operator reach doctor.  Bring all of your medication bottles to all your appointments in the pain clinic.  To cancel or reschedule your appointment with Pain Management please remember to call 24 hours in advance to avoid a fee.  Refer to the educational materials which you have been given on: General Risks, I had my Procedure. Discharge Instructions, Post Sedation.  Post Procedure Instructions:  The drugs you were given will stay in your system until tomorrow, so for the next 24 hours you should not drive, make any legal decisions or drink any alcoholic  beverages.  You may eat anything you prefer, but it is better to start with liquids then soups and crackers, and gradually work up to solid foods.  Please notify your doctor immediately if you have any unusual bleeding, trouble breathing or pain that is not related to your normal pain.  Depending on the type of procedure that was done, some parts of your body may feel week and/or numb.  This usually clears up by tonight or the next day.  Walk with the use of an assistive device or accompanied by an adult for the 24 hours.  You may use ice on the affected area for the first 24 hours.  Put ice in a Ziploc bag and cover with a towel and place against area 15 minutes on 15 minutes off.  You may switch to heat after 24 hours. 

## 2015-05-31 ENCOUNTER — Telehealth: Payer: Self-pay

## 2015-05-31 NOTE — Telephone Encounter (Signed)
Post procedure phone call.  Patient states she is sore, but doing good.  

## 2015-06-02 ENCOUNTER — Other Ambulatory Visit: Payer: Self-pay | Admitting: Pain Medicine

## 2015-06-04 ENCOUNTER — Telehealth: Payer: Self-pay

## 2015-06-04 NOTE — Telephone Encounter (Signed)
Pt wants Stacy Stephenson to know she is still in pain after facets

## 2015-06-04 NOTE — Telephone Encounter (Signed)
Patient advised that it may take 7-10 days for steroids to work. Advised to apply heat to the lower back.

## 2015-06-06 ENCOUNTER — Other Ambulatory Visit: Payer: Self-pay

## 2015-06-06 MED ORDER — GABAPENTIN 800 MG PO TABS: ORAL_TABLET | ORAL | Status: DC

## 2015-06-06 NOTE — Telephone Encounter (Signed)
E scribed to pharmacy.  Patient notified.

## 2015-06-06 NOTE — Telephone Encounter (Signed)
Patient is calling inquiring about a med refill that [facility] sent over and it has not been refilled yet. She would like for someone to give her a call.Stacy KitchenMarland KitchenTD

## 2015-06-09 NOTE — Progress Notes (Signed)
Quick Note:  NOTE: This forensic urine drug screen (UDS) test was conducted using a state-of-the-art ultra high performance liquid chromatography and mass spectrometry system (UPLC/MS-MS), the most sophisticated and accurate method available. UPLC/MS-MS is 1,000 times more precise and accurate than standard gas chromatography and mass spectrometry (GC/MS). This system can analyze 26 drug categories and 180 drug compounds.  The findings of this UDT were reported as abnormal due to inconsistencies with expected results. An expected substance was not present in the sample. Expectations were based on the medication history provided by the patient at the time of sample collection. These results are concerning due to the possibility of opioid diversion, or non-compliance with instructions on how to take the medication, including increase intake of medication early in the prescription refill, possibly running out towards the end of the prescribed period. ______ 

## 2015-06-17 ENCOUNTER — Telehealth: Payer: Self-pay

## 2015-06-17 NOTE — Telephone Encounter (Signed)
Pt having trouble with back and can't sleep pt has an appointment 5/18. Pt has been using a heating pad

## 2015-06-20 ENCOUNTER — Encounter: Payer: Self-pay | Admitting: Pain Medicine

## 2015-06-20 ENCOUNTER — Ambulatory Visit: Payer: Managed Care, Other (non HMO) | Attending: Pain Medicine | Admitting: Pain Medicine

## 2015-06-20 ENCOUNTER — Telehealth: Payer: Self-pay | Admitting: *Deleted

## 2015-06-20 VITALS — BP 158/101 | HR 90 | Temp 99.2°F | Resp 18 | Ht 61.0 in | Wt 147.0 lb

## 2015-06-20 DIAGNOSIS — I10 Essential (primary) hypertension: Secondary | ICD-10-CM | POA: Diagnosis not present

## 2015-06-20 DIAGNOSIS — G8929 Other chronic pain: Secondary | ICD-10-CM

## 2015-06-20 DIAGNOSIS — M47816 Spondylosis without myelopathy or radiculopathy, lumbar region: Secondary | ICD-10-CM | POA: Diagnosis not present

## 2015-06-20 DIAGNOSIS — F419 Anxiety disorder, unspecified: Secondary | ICD-10-CM | POA: Diagnosis not present

## 2015-06-20 DIAGNOSIS — M792 Neuralgia and neuritis, unspecified: Secondary | ICD-10-CM | POA: Diagnosis not present

## 2015-06-20 DIAGNOSIS — F329 Major depressive disorder, single episode, unspecified: Secondary | ICD-10-CM | POA: Insufficient documentation

## 2015-06-20 DIAGNOSIS — M5481 Occipital neuralgia: Secondary | ICD-10-CM | POA: Diagnosis not present

## 2015-06-20 DIAGNOSIS — Z79891 Long term (current) use of opiate analgesic: Secondary | ICD-10-CM | POA: Insufficient documentation

## 2015-06-20 DIAGNOSIS — E559 Vitamin D deficiency, unspecified: Secondary | ICD-10-CM | POA: Diagnosis not present

## 2015-06-20 DIAGNOSIS — N921 Excessive and frequent menstruation with irregular cycle: Secondary | ICD-10-CM | POA: Insufficient documentation

## 2015-06-20 DIAGNOSIS — Z87891 Personal history of nicotine dependence: Secondary | ICD-10-CM | POA: Insufficient documentation

## 2015-06-20 DIAGNOSIS — M549 Dorsalgia, unspecified: Secondary | ICD-10-CM | POA: Diagnosis present

## 2015-06-20 DIAGNOSIS — M533 Sacrococcygeal disorders, not elsewhere classified: Secondary | ICD-10-CM | POA: Diagnosis not present

## 2015-06-20 DIAGNOSIS — G43909 Migraine, unspecified, not intractable, without status migrainosus: Secondary | ICD-10-CM | POA: Diagnosis not present

## 2015-06-20 DIAGNOSIS — M545 Low back pain: Secondary | ICD-10-CM | POA: Diagnosis not present

## 2015-06-20 DIAGNOSIS — M797 Fibromyalgia: Secondary | ICD-10-CM | POA: Diagnosis not present

## 2015-06-20 DIAGNOSIS — R635 Abnormal weight gain: Secondary | ICD-10-CM | POA: Insufficient documentation

## 2015-06-20 DIAGNOSIS — M62838 Other muscle spasm: Secondary | ICD-10-CM | POA: Diagnosis not present

## 2015-06-20 DIAGNOSIS — R51 Headache: Secondary | ICD-10-CM | POA: Insufficient documentation

## 2015-06-20 DIAGNOSIS — M79606 Pain in leg, unspecified: Secondary | ICD-10-CM | POA: Diagnosis present

## 2015-06-20 DIAGNOSIS — E039 Hypothyroidism, unspecified: Secondary | ICD-10-CM | POA: Diagnosis not present

## 2015-06-20 DIAGNOSIS — K529 Noninfective gastroenteritis and colitis, unspecified: Secondary | ICD-10-CM | POA: Insufficient documentation

## 2015-06-20 MED ORDER — ORPHENADRINE CITRATE 30 MG/ML IJ SOLN
60.0000 mg | Freq: Once | INTRAMUSCULAR | Status: AC
  Administered 2015-06-20: 60 mg via INTRAMUSCULAR
  Filled 2015-06-20: qty 2

## 2015-06-20 MED ORDER — GABAPENTIN 100 MG PO CAPS: 100.0000 mg | ORAL_CAPSULE | Freq: Three times a day (TID) | ORAL | Status: DC

## 2015-06-20 MED ORDER — KETOROLAC TROMETHAMINE 60 MG/2ML IM SOLN
60.0000 mg | Freq: Once | INTRAMUSCULAR | Status: AC
  Administered 2015-06-20: 60 mg via INTRAMUSCULAR
  Filled 2015-06-20: qty 2

## 2015-06-20 MED ORDER — GABAPENTIN 800 MG PO TABS: 800.0000 mg | ORAL_TABLET | Freq: Every day | ORAL | Status: DC

## 2015-06-20 NOTE — Progress Notes (Signed)
Patient's Name: Stacy Stephenson  Patient type: Established  MRN: [id number]  Service setting: Ambulatory outpatient  DOB: 1975/12/27  Location: [facility]  DOS: 06/20/2015  Primary Care Physician: Cathlean Cower, MD  Note by: Kathlen Brunswick. Dossie Arbour, M.D, DABA, [place], DABPM, DABIPP, FIPP  Referring Physician: Biagio Borg, MD  Specialty: Board-Certified Interventional Pain Management  Last Visit to Pain Management: 06/17/2015   Primary Reason(s) for Visit: Encounter for post-procedure evaluation of chronic illness with mild to moderate exacerbation CC: Back Pain and Leg Pain   HPI  Stacy Stephenson is a 40 y.o. year old, female patient, who returns today as an established patient. She has DEPRESSION; MIGRAINE, COMMON; HYPERTENSION; GASTROENTERITIS, ACUTE; Anxiety state; Adult hypothyroidism; Avitaminosis D; Abnormal weight gain; Chronic pain; Long term current use of opiate analgesic; Long term prescription opiate use; Opiate use (22.5 MME/Day); Encounter for therapeutic drug level monitoring; Encounter for chronic pain management; Failed back surgical syndrome; Chronic low back pain (Location of Primary Source of Pain) (Bilateral) (R>L); Lumbar spondylosis; Lumbar facet syndrome (Bilateral) (R>L); Chronic sacroiliac joint pain (Right); Neurogenic pain; Neuropathic pain; Muscle spasm; Occipital headache; Bilateral occipital neuralgia; and Excessive and frequent menstruation with irregular cycle on her problem list.. Her primarily concern today is the Back Pain and Leg Pain   Pain Assessment: Self-Reported Pain Score: 5  Reported level is compatible with observation Pain Type: Chronic pain Pain Location: Back Pain Orientation: Lower Pain Descriptors / Indicators: Aching, Constant, Dull Pain Frequency: Constant  The patient comes into the clinics today for post-procedure evaluation on the interventional treatment done on 06/02/2015. Stacy Stephenson comes in with an acute flareup of her low  back pain. She indicates that the last procedure did not work well to the one before. On 06/02/2015 we did a diagnostic bilateral lumbar facet block under fluoroscopic guidance and IV sedation. Before that, on 02/21/2015 we had done a diagnostic bilateral lumbar facet block under fluoroscopic guidance and IV sedation, but at that time we also did a diagnostic bilateral sacroiliac joint injection under fluoroscopic guidance and IV sedation. It would seem that the patient had much better relief when we included both types of joint as opposed to just doing 1. It is for this reason that my next step will be to do a radiofrequency ablation of the lumbar facet joints, bilaterally. I will also do bilateral radiofrequency of the sacroiliac joints. Because of the complexity of the procedures, I usually like to do one-sided time. She indicates that the right side is the worse and therefore we will start there.  Date of Last Visit: 05/30/15 Service Provided on Last Visit: Procedure (blf)  Post-Procedure Assessment  Procedure done on last visit: Diagnostic bilateral lumbar facet block #2 under fluoroscopic guidance and IV sedation. Side-effects or Adverse reactions: None reported Sedation: Sedation given  Results: Ultra-Short Term Relief (First 1 hour after procedure): 100 %  Analgesia during this period is likely to be Local Anesthetic and/or IV Sedative (Analgesic/Anxiolitic) related Short Term Relief (Initial 4-6 hrs after procedure): 60 % Complete relief confirms area to be the source of pain Long Term Relief : 0 % No benefit could suggest etiology to be non-inflammatory, possibly compressive   Current Relief (Now): 0%  Recurrance of pain could suggest persistent aggravating factors Interpretation of Results: On 02/21/2015 the patient underwent a bilateral lumbar facet block plus bilateral sacroiliac joint block under fluoroscopic guidance and IV sedation. Compared to the last time, this provided the  patient with much better relief  of the pain and therefore it is clear to me that with the patient has is a combination of lumbar facet and sacroiliac joint problems. She continues to do relatively well with the diagnostic injection and now that she understands better how to rate the pain and what we are looking for an initial 4-6 hours, it would seem that she has done well with the local anesthetic. I would consider bilateral lumbar facet and sacroiliac joint radiofrequency ablation in an effort to provide her with longer lasting benefit.  Laboratory Chemistry  Inflammation Markers No results found for: ESRSEDRATE, CRP  Renal Function Lab Results  Component Value Date   BUN 20* 03/29/2013   CREATININE 1.15 03/29/2013   GFRAA >60 03/29/2013   GFRNONAA >60 03/29/2013    Hepatic Function Lab Results  Component Value Date   AST 26 02/12/2012   ALT 30 02/12/2012   ALBUMIN 4.4 02/12/2012    Electrolytes Lab Results  Component Value Date   NA 137 03/29/2013   K 3.9 03/29/2013   CL 106 03/29/2013   CALCIUM 9.6 03/29/2013    Pain Modulating Vitamins No results found for: [place], [id number], [id number], [id number], VITAMINB12  Coagulation Parameters No results found for: INR, LABPROT  Note: I personally reviewed the above data. Results made available to patient.  Recent Diagnostic Imaging  No results found.  Meds  The patient has a current medication list which includes the following prescription(s): clonazepam, cyclobenzaprine, gabapentin, gabapentin, hydrochlorothiazide, ibuprofen, junel 1/20, levothyroxine, levothyroxine, magnesium oxide, methocarbamol, metoprolol tartrate, ondansetron, oxycodone, oxycodone, oxycodone, and vitamin d (ergocalciferol).  Current Outpatient Prescriptions on File Prior to Visit  Medication Sig  . cyclobenzaprine (FLEXERIL) 10 MG tablet Take 1 tablet (10 mg total) by mouth at bedtime.  . hydrochlorothiazide (HYDRODIURIL) 25 MG tablet Take 25 mg by  mouth every other day. Mon, tues, wed, thurs, fri  . ibuprofen (ADVIL,MOTRIN) 800 MG tablet Take 800 mg by mouth every 8 (eight) hours as needed.   Lenda Kelp 1/20 1-20 MG-MCG tablet Take 1 tablet by mouth daily.  [name] levothyroxine (SYNTHROID, LEVOTHROID) 25 MCG tablet Take 25 mcg by mouth daily before breakfast. saturday  . levothyroxine (SYNTHROID, LEVOTHROID) 50 MCG tablet Take 50 mcg by mouth daily before breakfast. Monday - Friday  . magnesium oxide (MAG-OX) 400 MG tablet Take 400 mg by mouth daily.   . methocarbamol (ROBAXIN) 500 MG tablet Take 1 tablet (500 mg total) by mouth 2 (two) times daily as needed for muscle spasms.  . metoprolol tartrate (LOPRESSOR) 25 MG tablet Take 50 mg by mouth at bedtime.   . ondansetron (ZOFRAN-ODT) 8 MG disintegrating tablet Take by mouth every 8 (eight) hours as needed.   [name] oxyCODONE (OXY IR/ROXICODONE) 5 MG immediate release tablet Take 1 tablet (5 mg total) by mouth every 8 (eight) hours as needed for severe pain.  [name] oxyCODONE (OXY IR/ROXICODONE) 5 MG immediate release tablet Take 1 tablet (5 mg total) by mouth every 8 (eight) hours as needed for severe pain.  [name] oxyCODONE (OXY IR/ROXICODONE) 5 MG immediate release tablet Take 1 tablet (5 mg total) by mouth every 8 (eight) hours as needed for severe pain.  . Vitamin D, Ergocalciferol, (DRISDOL) 50000 units CAPS capsule Take 50,000 Units by mouth every 7 (seven) days.    No current facility-administered medications on file prior to visit.    ROS  Constitutional: Denies any fever or chills Gastrointestinal: No reported hemesis, hematochezia, vomiting, or acute GI distress Musculoskeletal: Denies any acute onset joint swelling,  redness, loss of ROM, or weakness Neurological: No reported episodes of acute onset apraxia, aphasia, dysarthria, agnosia, amnesia, paralysis, loss of coordination, or loss of consciousness  Allergies  Ms. Mckeown is allergic to fluoxetine hcl; venlafaxine; zolpidem tartrate; and  zolpidem tartrate.  [place]  Medical:  Ms. Colli  has a past medical history of Hypothyroidism; Depression; Migraines; Hypertension; Fibromyalgia; and Cancer ([place]). Family: family history includes Arthritis in her mother; Diabetes in her father; Heart disease in her father; Stroke in her father. Surgical:  has past surgical history that includes Breast surgery and Microdiscectomy lumbar. Tobacco:  reports that she has quit smoking. Her smoking use included Cigarettes. She has a 12 pack-year smoking history. She does not have any smokeless tobacco history on file. Alcohol:  reports that she drinks alcohol. Drug:  reports that she does not use illicit drugs.  Constitutional Exam  Vitals: Blood pressure 158/101, pulse 90, temperature 99.2 F (37.3 C), temperature source Oral, resp. rate 18, height 5\' 1"  (1.549 m), weight 147 lb (66.679 kg), last menstrual period 04/28/2015, SpO2 100 %. General appearance: Well nourished, well developed, and well hydrated. In no acute distress Calculated BMI/Body habitus: Body mass index is 27.79 kg/(m^2). (25-29.9 kg/m2) Overweight - 20% higher incidence of chronic pain Psych/Mental status: Alert and oriented x 3 (person, place, & time) Eyes: PERLA Respiratory: No evidence of acute respiratory distress  Cervical Spine Exam  Inspection: No masses, redness, or swelling Alignment: Symmetrical ROM: Functional: ROM is within functional limits [facility]) Stability: No instability detected Muscle strength & Tone: Functionally intact Sensory: Unimpaired Palpation: No complaints of tenderness  Upper Extremity (UE) Exam    Side: Right upper extremity  Side: Left upper extremity  Inspection: No masses, redness, swelling, or asymmetry  Inspection: No masses, redness, swelling, or asymmetry  ROM:  ROM:  Functional: ROM is within functional limits [facility])  Functional: ROM is within functional limits [facility])  Muscle strength & Tone: Functionally intact  Muscle strength & Tone:  Functionally intact  Sensory: Unimpaired  Sensory: Unimpaired  Palpation: Non-contributory  Palpation: Non-contributory   Thoracic Spine Exam  Inspection: No masses, redness, or swelling Alignment: Symmetrical ROM: Functional: ROM is within functional limits [facility]) Stability: No instability detected Sensory: Unimpaired Muscle strength & Tone: Functionally intact Palpation: No complaints of tenderness  Lumbar Spine Exam  Inspection: No masses, redness, or swelling Alignment: Symmetrical ROM: Functional: Pain-restricted ROM Stability: No instability detected Muscle strength & Tone: Functionally intact Sensory: Painful response to non-painful stimuli (Allodynia) Palpation: Tender to palpation over the bilateral paravertebral muscles Provocative Tests: Lumbar Hyperextension and rotation test: Positive for bilateral lumbar facet pain. Patrick's Maneuver: Positive for bilateral sacroiliac joint pain.  Gait & Posture Assessment  Ambulation: Unassisted Gait: Antalgic gait (limping) Posture: Antalgic  Lower Extremity Exam    Side: Right lower extremity  Side: Left lower extremity  Inspection: No masses, redness, swelling, or asymmetry ROM:  Inspection: No masses, redness, swelling, or asymmetry ROM:  Functional: ROM is within functional limits [facility])  Functional: ROM is within functional limits [facility])  Muscle strength & Tone: Functionally intact  Muscle strength & Tone: Functionally intact  Sensory: Unimpaired  Sensory: Unimpaired  Palpation: Non-contributory  Palpation: Non-contributory   Assessment & Plan  Primary Diagnosis & Pertinent Problem List: The primary encounter diagnosis was Chronic low back pain (Location of Primary Source of Pain) (Bilateral) (R>L). Diagnoses of Chronic sacroiliac joint pain (Right), Lumbar facet syndrome (Bilateral) (R>L), Neuropathic pain, and Bilateral occipital neuralgia were also pertinent to this visit.  Visit Diagnosis: 1. Chronic low back pain  (Location of Primary Source of Pain) (Bilateral) (R>L)   2. Chronic sacroiliac joint pain (Right)   3. Lumbar facet syndrome (Bilateral) (R>L)   4. Neuropathic pain   5. Bilateral occipital neuralgia     Problem-specific Plan(s): No problem-specific assessment & plan notes found for this encounter.   Plan of Care   Problem List Items Addressed This Visit      High   Bilateral occipital neuralgia (Chronic)   Relevant Medications   orphenadrine (NORFLEX) injection 60 mg (Completed)   ketorolac (TORADOL) injection 60 mg (Completed)   Other Relevant Orders   GREATER OCCIPITAL NERVE BLOCK   Chronic low back pain (Location of Primary Source of Pain) (Bilateral) (R>L) - Primary (Chronic)   Relevant Medications   orphenadrine (NORFLEX) injection 60 mg (Completed)   ketorolac (TORADOL) injection 60 mg (Completed)   Chronic sacroiliac joint pain (Right) (Chronic)   Relevant Medications   orphenadrine (NORFLEX) injection 60 mg (Completed)   ketorolac (TORADOL) injection 60 mg (Completed)   Other Relevant Orders   Radiofrequency Sacroiliac Joint   Lumbar facet syndrome (Bilateral) (R>L) (Chronic)   Relevant Medications   orphenadrine (NORFLEX) injection 60 mg (Completed)   ketorolac (TORADOL) injection 60 mg (Completed)   Other Relevant Orders   Radiofrequency,Lumbar   Neuropathic pain (Chronic)   Relevant Medications   gabapentin (NEURONTIN) 100 MG capsule   gabapentin (NEURONTIN) 800 MG tablet       Pharmacotherapy (Medications Ordered): Meds ordered this encounter  Medications  . orphenadrine (NORFLEX) injection 60 mg    Sig:   . ketorolac (TORADOL) injection 60 mg    Sig:   . gabapentin (NEURONTIN) 100 MG capsule    Sig: Take 1-3 capsules (100-300 mg total) by mouth 3 (three) times daily. Follow titration schedule.    Dispense:  360 capsule    Refill:  2    Do not place this medication, or any other prescription from our practice, on "Automatic Refill". Patient may  have prescription filled one day early if pharmacy is closed on scheduled refill date.  . gabapentin (NEURONTIN) 800 MG tablet    Sig: Take 1 tablet (800 mg total) by mouth at bedtime.    Dispense:  30 tablet    Refill:  2    Do not add this medication to the electronic "Automatic Refill" notification system. Patient may have prescription filled one day early if pharmacy is closed on scheduled refill date.    Lab-work & Procedure Ordered: Orders Placed This Encounter  Procedures  . Radiofrequency,Lumbar    Standing Status: Future     Number of Occurrences:      Standing Expiration Date: 06/19/2016    Scheduling Instructions:     Side(s): Right-sided     Level(s): L2, L3, L4, L5, & S1 Medial Branch Nerves     Sedation: With Sedation.     Timeframe: ASAA    Order Specific Question:  Where will this procedure be performed?    Answer:  [facility]  . Radiofrequency Sacroiliac Joint    Standing Status: Future     Number of Occurrences:      Standing Expiration Date: 06/19/2016    Scheduling Instructions:     Side(s): Right-sided     Level(s): L4, L5, S1, S2, & S3 Medial Branch Nerves     Sedation: With Sedation     Timeframe: ASAA  . GREATER OCCIPITAL NERVE BLOCK    Standing Status:  Future     Number of Occurrences:      Standing Expiration Date: 06/19/2016    Scheduling Instructions:     Side: Bilateral     Sedation: With Sedation.     Timeframe: PRN Procedure. Patient will call.    Order Specific Question:  Where will this procedure be performed?    Answer:  [facility]    Imaging Ordered: None  Interventional Therapies: Scheduled:  Right-sided lumbar facet and sacroiliac joint radiofrequency ablation under fluoroscopic guidance and IV sedation.    Considering:  Radiofrequency ablation of the lumbar facet joints and sacroiliac joints, bilaterally. Possible spinal cord stimulator implant candidate.    PRN Procedures:  Diagnostic bilateral greater  occipital nerve block under fluoroscopic guidance and IV sedation.    Referral(s) or Consult(s): None at this time.  Medications administered during this visit: We administered orphenadrine and ketorolac.  Requested PM Follow-up: Return for Keep previously scheduled appointment., Procedure (Scheduled), Procedure (PRN - Patient will call).  Future Appointments Date Time Provider [place]  08/07/2015 9:00 AM Milinda Pointer, MD [facility] None    Primary Care Physician: Cathlean Cower, MD Location: [facility] Note by: Kathlen Brunswick. Dossie Arbour, M.D, DABA, DABAPM, DABPM, DABIPP, FIPP  Pain Score Disclaimer: We use the NRS-11 scale. This is a self-reported, subjective measurement of pain severity with only modest accuracy. It is used primarily to identify changes within a particular patient. It must be understood that outpatient pain scales are significantly less accurate that those used for research, where they can be applied under ideal controlled circumstances with minimal exposure to variables. In reality, the score is likely to be a combination of pain intensity and pain affect, where pain affect describes the degree of emotional arousal or changes in action readiness caused by the sensory experience of pain. Factors such as social and work situation, setting, emotional state, anxiety levels, expectation, and prior pain experience may influence pain perception and show large inter-individual differences that may also be affected by time variables.  Patient instructions provided during this appointment: Patient Instructions  Occipital Nerve Block Patient Information  Description: The occipital nerves originate in the cervical (neck) spinal cord and travel upward through muscle and tissue to supply sensation to the back of the head and top of the scalp.  In addition, the nerves control some of the muscles of the scalp.  Occipital neuralgia is an irritation of  these nerves which can cause headaches, numbness of the scalp, and neck discomfort.     The occipital nerve block will interrupt nerve transmission through these nerves and can relieve pain and spasm.  The block consists of insertion of a small needle under the skin in the back of the head to deposit local anesthetic (numbing medicine) and/or steroids around the nerve.  The entire block usually lasts less than 5 minutes.  Conditions which may be treated by occipital blocks:   Muscular pain and spasm of the scalp  Nerve irritation, back of the head  Headaches  Upper neck pain  Preparation for the injection:  1. Do not eat any solid food or dairy products within 8 hours of your appointment. 2. You may drink clear liquids up to 3 hours before appointment.  Clear liquids include water, black coffee, juice or soda.  No milk or cream please. 3. You may take your regular medication, including pain medications, with a sip of water before you appointment.  Diabetics should hold regular insulin (if taken separately)  and take 1/2 normal NPH dose the morning of the procedure.  Carry some sugar containing items with you to your appointment. 4. A driver must accompany you and be prepared to drive you home after your procedure. 5. Bring all your current medications with you. 6. An IV may be inserted and sedation may be given at the discretion of the physician. 7. A blood pressure cuff, EKG, and other monitors will often be applied during the procedure.  Some patients may need to have extra oxygen administered for a short period. 8. You will be asked to provide medical information, including your allergies and medications, prior to the procedure.  We must know immediately if you are taking blood thinners (like Coumadin/Warfarin) or if you are allergic to IV iodine contrast (dye).  We must know if you could possible be pregnant.  9. Do not wear a high collared shirt or turtleneck.  Tie long hair up in the  back if possible.  Possible side-effects:   Bleeding from needle site  Infection (rare, may require surgery)  Nerve injury (rare)  Hair on back of neck can be tinged with iodine scrub (this will wash out)  Light-headedness (temporary)  Pain at injection site (several days)  Decreased blood pressure (rare, temporary)  Seizure (very rare)  Call if you experience:   Hives or difficulty breathing ( go to the emergency room)  Inflammation or drainage at the injection site(s)  Please note:  Although the local anesthetic injected can often make your painful muscles or headache feel good for several hours after the injection, the pain may return.  It takes 3-7 days for steroids to work.  You may not notice any pain relief for at least one week.  If effective, we will often do a series of injections spaced 3-6 weeks apart to maximally decrease your pain.  If you have any questions, please call [phone] [facility] Radiofrequency Lesioning Radiofrequency lesioning is a procedure that is performed to relieve pain. The procedure is often used for back, neck, or arm pain. Radiofrequency lesioning involves the use of a machine that creates radio waves to make heat. During the procedure, the heat is applied to the nerve that carries the pain signal. The heat damages the nerve and interferes with the pain signal. Pain relief usually lasts for 6 months to 1 year. LET [facility] CARE PROVIDER KNOW ABOUT: 2. Any allergies you have. 3. All medicines you are taking, including vitamins, herbs, eye drops, creams, and over-the-counter medicines. 4. Previous problems you or members of your family have had with the use of anesthetics. 5. Any blood disorders you have. 6. Previous surgeries you have had. 7. Any medical conditions you have. 8. Whether you are pregnant or may be pregnant. RISKS AND COMPLICATIONS Generally, this is a safe procedure. However,  problems may occur, including: 2. Pain or soreness at the injection site. 3. Infection at the injection site. 4. Damage to nerves or blood vessels. BEFORE THE PROCEDURE 2. Ask your health care provider about: 1. Changing or stopping your regular medicines. This is especially important if you are taking diabetes medicines or blood thinners. 2. Taking medicines such as aspirin and ibuprofen. These medicines can thin your blood. Do not take these medicines before your procedure if your health care provider instructs you not to. 3. Follow instructions from your health care provider about eating or drinking restrictions. 4. Plan to have someone take you home after the procedure. 5.  If you go home right after the procedure, plan to have someone with you for 24 hours. PROCEDURE 2. You will be given one or more of the following: 1. A medicine to help you relax (sedative). 2. A medicine to numb the area (local anesthetic). 3. You will be awake during the procedure. You will need to be able to talk with the health care provider during the procedure. 4. With the help of a type of X-ray (fluoroscopy), the health care provider will insert a radiofrequency needle into the area to be treated. 5. Next, a wire that carries the radio waves (electrode) will be put through the radiofrequency needle. An electrical pulse will be sent through the electrode to verify the correct nerve. You will feel a tingling sensation, and you may have muscle twitching. 6. Then, the tissue that is around the needle tip will be heated by an electric current that is passed using the radiofrequency machine. This will numb the nerves. 7. A bandage (dressing) will be put on the insertion area after the procedure is done. The procedure may vary among health care providers and hospitals. AFTER THE PROCEDURE 10. Your blood pressure, heart rate, breathing rate, and blood oxygen level will be monitored often until the medicines you were given  have worn off. 11. Return to your normal activities as directed by your health care provider.   This information is not intended to replace advice given to you by your health care provider. Make sure you discuss any questions you have with your health care provider.   Document Released: 09/17/2010 Document Revised: 10/10/2014 Document Reviewed: 02/26/2014 Elsevier Interactive Patient Education [employer].

## 2015-06-20 NOTE — Patient Instructions (Signed)
Occipital Nerve Block Patient Information  Description: The occipital nerves originate in the cervical (neck) spinal cord and travel upward through muscle and tissue to supply sensation to the back of the head and top of the scalp.  In addition, the nerves control some of the muscles of the scalp.  Occipital neuralgia is an irritation of these nerves which can cause headaches, numbness of the scalp, and neck discomfort.     The occipital nerve block will interrupt nerve transmission through these nerves and can relieve pain and spasm.  The block consists of insertion of a small needle under the skin in the back of the head to deposit local anesthetic (numbing medicine) and/or steroids around the nerve.  The entire block usually lasts less than 5 minutes.  Conditions which may be treated by occipital blocks:   Muscular pain and spasm of the scalp  Nerve irritation, back of the head  Headaches  Upper neck pain  Preparation for the injection:  1. Do not eat any solid food or dairy products within 8 hours of your appointment. 2. You may drink clear liquids up to 3 hours before appointment.  Clear liquids include water, black coffee, juice or soda.  No milk or cream please. 3. You may take your regular medication, including pain medications, with a sip of water before you appointment.  Diabetics should hold regular insulin (if taken separately) and take 1/2 normal NPH dose the morning of the procedure.  Carry some sugar containing items with you to your appointment. 4. A driver must accompany you and be prepared to drive you home after your procedure. 5. Bring all your current medications with you. 6. An IV may be inserted and sedation may be given at the discretion of the physician. 7. A blood pressure cuff, EKG, and other monitors will often be applied during the procedure.  Some patients may need to have extra oxygen administered for a short period. 8. You will be asked to provide medical  information, including your allergies and medications, prior to the procedure.  We must know immediately if you are taking blood thinners (like Coumadin/Warfarin) or if you are allergic to IV iodine contrast (dye).  We must know if you could possible be pregnant.  9. Do not wear a high collared shirt or turtleneck.  Tie long hair up in the back if possible.  Possible side-effects:   Bleeding from needle site  Infection (rare, may require surgery)  Nerve injury (rare)  Hair on back of neck can be tinged with iodine scrub (this will wash out)  Light-headedness (temporary)  Pain at injection site (several days)  Decreased blood pressure (rare, temporary)  Seizure (very rare)  Call if you experience:   Hives or difficulty breathing ( go to the emergency room)  Inflammation or drainage at the injection site(s)  Please note:  Although the local anesthetic injected can often make your painful muscles or headache feel good for several hours after the injection, the pain may return.  It takes 3-7 days for steroids to work.  You may not notice any pain relief for at least one week.  If effective, we will often do a series of injections spaced 3-6 weeks apart to maximally decrease your pain.  If you have any questions, please call [phone] [facility] Radiofrequency Lesioning Radiofrequency lesioning is a procedure that is performed to relieve pain. The procedure is often used for back, neck, or arm pain. Radiofrequency lesioning involves  the use of a machine that creates radio waves to make heat. During the procedure, the heat is applied to the nerve that carries the pain signal. The heat damages the nerve and interferes with the pain signal. Pain relief usually lasts for 6 months to 1 year. LET [facility] CARE PROVIDER KNOW ABOUT: 2. Any allergies you have. 3. All medicines you are taking, including vitamins, herbs, eye drops, creams, and  over-the-counter medicines. 4. Previous problems you or members of your family have had with the use of anesthetics. 5. Any blood disorders you have. 6. Previous surgeries you have had. 7. Any medical conditions you have. 8. Whether you are pregnant or may be pregnant. RISKS AND COMPLICATIONS Generally, this is a safe procedure. However, problems may occur, including: 2. Pain or soreness at the injection site. 3. Infection at the injection site. 4. Damage to nerves or blood vessels. BEFORE THE PROCEDURE 2. Ask your health care provider about: 1. Changing or stopping your regular medicines. This is especially important if you are taking diabetes medicines or blood thinners. 2. Taking medicines such as aspirin and ibuprofen. These medicines can thin your blood. Do not take these medicines before your procedure if your health care provider instructs you not to. 3. Follow instructions from your health care provider about eating or drinking restrictions. 4. Plan to have someone take you home after the procedure. 5. If you go home right after the procedure, plan to have someone with you for 24 hours. PROCEDURE 2. You will be given one or more of the following: 1. A medicine to help you relax (sedative). 2. A medicine to numb the area (local anesthetic). 3. You will be awake during the procedure. You will need to be able to talk with the health care provider during the procedure. 4. With the help of a type of X-ray (fluoroscopy), the health care provider will insert a radiofrequency needle into the area to be treated. 5. Next, a wire that carries the radio waves (electrode) will be put through the radiofrequency needle. An electrical pulse will be sent through the electrode to verify the correct nerve. You will feel a tingling sensation, and you may have muscle twitching. 6. Then, the tissue that is around the needle tip will be heated by an electric current that is passed using the radiofrequency  machine. This will numb the nerves. 7. A bandage (dressing) will be put on the insertion area after the procedure is done. The procedure may vary among health care providers and hospitals. AFTER THE PROCEDURE 10. Your blood pressure, heart rate, breathing rate, and blood oxygen level will be monitored often until the medicines you were given have worn off. 11. Return to your normal activities as directed by your health care provider.   This information is not intended to replace advice given to you by your health care provider. Make sure you discuss any questions you have with your health care provider.   Document Released: 09/17/2010 Document Revised: 10/10/2014 Document Reviewed: 02/26/2014 Elsevier Interactive Patient Education [employer].

## 2015-06-20 NOTE — Progress Notes (Signed)
Safety precautions to be maintained throughout the outpatient stay will include: orient to surroundings, keep bed in low position, maintain call bell within reach at all times, provide assistance with transfer out of bed and ambulation.  

## 2015-06-20 NOTE — Telephone Encounter (Signed)
Pt is aware that insurance needs auth before procedure. i placed in angie box

## 2015-06-25 ENCOUNTER — Encounter: Payer: Self-pay | Admitting: Emergency Medicine

## 2015-06-25 DIAGNOSIS — Z87891 Personal history of nicotine dependence: Secondary | ICD-10-CM | POA: Diagnosis not present

## 2015-06-25 DIAGNOSIS — Z859 Personal history of malignant neoplasm, unspecified: Secondary | ICD-10-CM | POA: Insufficient documentation

## 2015-06-25 DIAGNOSIS — E039 Hypothyroidism, unspecified: Secondary | ICD-10-CM | POA: Diagnosis not present

## 2015-06-25 DIAGNOSIS — Z791 Long term (current) use of non-steroidal anti-inflammatories (NSAID): Secondary | ICD-10-CM | POA: Insufficient documentation

## 2015-06-25 DIAGNOSIS — F329 Major depressive disorder, single episode, unspecified: Secondary | ICD-10-CM | POA: Insufficient documentation

## 2015-06-25 DIAGNOSIS — G8929 Other chronic pain: Secondary | ICD-10-CM | POA: Diagnosis not present

## 2015-06-25 DIAGNOSIS — Z79899 Other long term (current) drug therapy: Secondary | ICD-10-CM | POA: Diagnosis not present

## 2015-06-25 DIAGNOSIS — M545 Low back pain: Secondary | ICD-10-CM | POA: Diagnosis not present

## 2015-06-25 DIAGNOSIS — I10 Essential (primary) hypertension: Secondary | ICD-10-CM | POA: Insufficient documentation

## 2015-06-25 NOTE — ED Notes (Signed)
Patient ambulatory to triage with steady gait, without difficulty or distress noted; pt reports lower back pain and frontal HA; hx chronic back pain and seen by pain clinic on Thursday for injection; st pain unrelieved

## 2015-06-26 ENCOUNTER — Emergency Department
Admission: EM | Admit: 2015-06-26 | Discharge: 2015-06-26 | Disposition: A | Discharge status: Home / Self Care | Payer: Managed Care, Other (non HMO) | Attending: Emergency Medicine | Admitting: Emergency Medicine

## 2015-06-26 DIAGNOSIS — G8929 Other chronic pain: Secondary | ICD-10-CM

## 2015-06-26 DIAGNOSIS — M545 Low back pain, unspecified: Secondary | ICD-10-CM

## 2015-06-26 DIAGNOSIS — M549 Dorsalgia, unspecified: Secondary | ICD-10-CM

## 2015-06-26 MED ORDER — LIDOCAINE 5 % EX PTCH
1.0000 | MEDICATED_PATCH | CUTANEOUS | Status: DC
  Administered 2015-06-26: 1 via TRANSDERMAL
  Filled 2015-06-26 (×2): qty 1

## 2015-06-26 MED ORDER — DIAZEPAM 2 MG PO TABS
2.0000 mg | ORAL_TABLET | Freq: Once | ORAL | Status: AC
  Administered 2015-06-26: 2 mg via ORAL
  Filled 2015-06-26: qty 1

## 2015-06-26 MED ORDER — DIAZEPAM 2 MG PO TABS: 2.0000 mg | ORAL_TABLET | Freq: Three times a day (TID) | ORAL | Status: DC | PRN

## 2015-06-26 MED ORDER — HYDROMORPHONE HCL 1 MG/ML IJ SOLN
2.0000 mg | Freq: Once | INTRAMUSCULAR | Status: AC
  Administered 2015-06-26: 2 mg via INTRAMUSCULAR
  Filled 2015-06-26: qty 2

## 2015-06-26 MED ORDER — LIDOCAINE 5 % EX PTCH: 1.0000 | MEDICATED_PATCH | CUTANEOUS | Status: DC

## 2015-06-26 NOTE — Discharge Instructions (Signed)
1. Use lidocaine patch as prescribed (#20). 2. You may substitute Valium (#15) in place of Robaxin or Flexeril for muscle relaxation. 3. Return to the ER for worsening symptoms, persistent vomiting, difficulty breathing or other concerns.  Chronic Back Pain  When back pain lasts longer than 3 months, it is called chronic back pain.People with chronic back pain often go through certain periods that are more intense (flare-ups).  CAUSES Chronic back pain can be caused by wear and tear (degeneration) on different structures in your back. These structures include:  The bones of your spine (vertebrae) and the joints surrounding your spinal cord and nerve roots (facets).  The strong, fibrous tissues that connect your vertebrae (ligaments). Degeneration of these structures may result in pressure on your nerves. This can lead to constant pain. HOME CARE INSTRUCTIONS  Avoid bending, heavy lifting, prolonged sitting, and activities which make the problem worse.  Take brief periods of rest throughout the day to reduce your pain. Lying down or standing usually is better than sitting while you are resting.  Take over-the-counter or prescription medicines only as directed by your caregiver. SEEK IMMEDIATE MEDICAL CARE IF:   You have weakness or numbness in one of your legs or feet.  You have trouble controlling your bladder or bowels.  You have nausea, vomiting, abdominal pain, shortness of breath, or fainting.   This information is not intended to replace advice given to you by your health care provider. Make sure you discuss any questions you have with your health care provider.   Document Released: 02/27/2004 Document Revised: 04/13/2011 Document Reviewed: 07/09/2014 Elsevier Interactive Patient Education 2016 [employer]  Back Pain, Adult Back pain is very common. The pain often gets better over time. The cause of back pain is usually not dangerous. Most people can learn to manage their  back pain on their own.  HOME CARE  Watch your back pain for any changes. The following actions may help to lessen any pain you are feeling:  Stay active. Start with short walks on flat ground if you can. Try to walk farther each day.  Exercise regularly as told by your doctor. Exercise helps your back heal faster. It also helps avoid future injury by keeping your muscles strong and flexible.  Do not sit, drive, or stand in one place for more than 30 minutes.  Do not stay in bed. Resting more than 1-2 days can slow down your recovery.  Be careful when you bend or lift an object. Use good form when lifting:  Bend at your knees.  Keep the object close to your body.  Do not twist.  Sleep on a firm mattress. Lie on your side, and bend your knees. If you lie on your back, put a pillow under your knees.  Take medicines only as told by your doctor.  Put ice on the injured area.  Put ice in a plastic bag.  Place a towel between your skin and the bag.  Leave the ice on for 20 minutes, 2-3 times a day for the first 2-3 days. After that, you can switch between ice and heat packs.  Avoid feeling anxious or stressed. Find good ways to deal with stress, such as exercise.  Maintain a healthy weight. Extra weight puts stress on your back. GET HELP IF:   You have pain that does not go away with rest or medicine.  You have worsening pain that goes down into your legs or buttocks.  You have pain that  does not get better in one week.  You have pain at night.  You lose weight.  You have a fever or chills. GET HELP RIGHT AWAY IF:   You cannot control when you poop (bowel movement) or pee (urinate).  Your arms or legs feel weak.  Your arms or legs lose feeling (numbness).  You feel sick to your stomach (nauseous) or throw up (vomit).  You have belly (abdominal) pain.  You feel like you may pass out (faint).   This information is not intended to replace advice given to you by  your health care provider. Make sure you discuss any questions you have with your health care provider.   Document Released: 07/08/2007 Document Revised: 02/09/2014 Document Reviewed: 05/23/2013 Elsevier Interactive Patient Education [employer].

## 2015-06-26 NOTE — ED Provider Notes (Signed)
[facility] Emergency Department Provider Note   ____________________________________________  Time seen: Approximately 1:27 AM  I have reviewed the triage vital signs and the nursing notes.   HISTORY  Chief Complaint Back Pain and Headache    HPI Stacy Stephenson is a 40 y.o. female who presents to the ED from home with a chief complaint of acute on chronic back pain. Patient has a history of back pain s/p lumbar discectomy in 2016, fibromyalgia who is managed at the pain clinic. Patient has had an increase in her lower back pain for the past several weeks. She saw her surgeon recently who now recommends fusion. She last had a facet injection by Dr. Alver Fisher from the pain clinic approximately one month ago. She saw him again last week and was given an injection of Toradol and Norflex in the office. Complains of persistent pain despite taking Robaxin, Motrin, gabapentin and Percocet today. Denies recent travel or trauma. Denies chest pain, shortness of breath, abdominal pain, no nausea, vomiting, diarrhea, numbness/tingling, bowel or bladder incontinence.Nothing makes her pain better. Movement makes her pain worse.   Past Medical History  Diagnosis Date  . Hypothyroidism   . Depression   . Migraines   . Hypertension   . Fibromyalgia   . Cancer [facility])     cervical    Patient Active Problem List   Diagnosis Date Noted  . Excessive and frequent menstruation with irregular cycle 05/30/2015  . Occipital headache 05/08/2015  . Bilateral occipital neuralgia 05/08/2015  . Chronic pain 02/11/2015  . Long term current use of opiate analgesic 02/11/2015  . Long term prescription opiate use 02/11/2015  . Opiate use (22.5 MME/Day) 02/11/2015  . Encounter for therapeutic drug level monitoring 02/11/2015  . Encounter for chronic pain management 02/11/2015  . 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 Chronic low back pain (Location of Primary Source of Pain)  (Bilateral) (R>L) 02/11/2015  . Lumbar spondylosis 02/11/2015  . Lumbar facet syndrome (Bilateral) (R>L) 02/11/2015  . Chronic sacroiliac joint pain (Right) 02/11/2015  . Neurogenic pain 02/11/2015  . Neuropathic pain 02/11/2015  . Muscle spasm 02/11/2015  . Anxiety state 01/31/2014  . Adult hypothyroidism 11/28/2013  . Avitaminosis D 11/28/2013  . Abnormal weight gain 11/28/2013  . MIGRAINE, COMMON 06/23/2007  . GASTROENTERITIS, ACUTE 06/23/2007  . DEPRESSION 01/24/2007  . HYPERTENSION 01/24/2007    Past Surgical History  Procedure Laterality Date  . Breast surgery    . Microdiscectomy lumbar      Current Outpatient Rx  Name  Route  Sig  Dispense  Refill  . clonazePAM (KLONOPIN) 2 MG tablet               . cyclobenzaprine (FLEXERIL) 10 MG tablet   Oral   Take 1 tablet (10 mg total) by mouth at bedtime.   30 tablet   2     Do not place this medication, or any other prescri ...   . gabapentin (NEURONTIN) 100 MG capsule   Oral   Take 1-3 capsules (100-300 mg total) by mouth 3 (three) times daily. Follow titration schedule.   360 capsule   2     Do not place this medication, or any other prescri ...   . gabapentin (NEURONTIN) 800 MG tablet   Oral   Take 1 tablet (800 mg total) by mouth at bedtime.   30 tablet   2     Do not add this medication to the electronic "Auto ...   [name]  hydrochlorothiazide (HYDRODIURIL) 25 MG tablet   Oral   Take 25 mg by mouth every other day. Mon, tues, wed, thurs, fri         . ibuprofen (ADVIL,MOTRIN) 800 MG tablet   Oral   Take 800 mg by mouth every 8 (eight) hours as needed.          Lenda Kelp 1/20 1-20 MG-MCG tablet   Oral   Take 1 tablet by mouth daily.      11     Dispense as written.   [name] levothyroxine (SYNTHROID, LEVOTHROID) 25 MCG tablet   Oral   Take 25 mcg by mouth daily before breakfast. saturday         . levothyroxine (SYNTHROID, LEVOTHROID) 50 MCG tablet   Oral   Take 50 mcg by mouth daily before  breakfast. Monday - Friday         . magnesium oxide (MAG-OX) 400 MG tablet   Oral   Take 400 mg by mouth daily.          . methocarbamol (ROBAXIN) 500 MG tablet   Oral   Take 1 tablet (500 mg total) by mouth 2 (two) times daily as needed for muscle spasms.   60 tablet   1     Do not place this medication, or any other prescri ...   . metoprolol tartrate (LOPRESSOR) 25 MG tablet   Oral   Take 50 mg by mouth at bedtime.          . ondansetron (ZOFRAN-ODT) 8 MG disintegrating tablet   Oral   Take by mouth every 8 (eight) hours as needed.          [name] oxyCODONE (OXY IR/ROXICODONE) 5 MG immediate release tablet   Oral   Take 1 tablet (5 mg total) by mouth every 8 (eight) hours as needed for severe pain.   90 tablet   0     Do not place this medication, or any other prescri ...   . oxyCODONE (OXY IR/ROXICODONE) 5 MG immediate release tablet   Oral   Take 1 tablet (5 mg total) by mouth every 8 (eight) hours as needed for severe pain.   90 tablet   0     Do not place this medication, or any other prescri ...   . oxyCODONE (OXY IR/ROXICODONE) 5 MG immediate release tablet   Oral   Take 1 tablet (5 mg total) by mouth every 8 (eight) hours as needed for severe pain.   90 tablet   0     Do not place this medication, or any other prescri ...   . Vitamin D, Ergocalciferol, (DRISDOL) 50000 units CAPS capsule   Oral   Take 50,000 Units by mouth every 7 (seven) days.            Allergies Fluoxetine hcl; Venlafaxine; Zolpidem tartrate; and Zolpidem tartrate  Family History  Problem Relation Age of Onset  . Arthritis Mother   . Heart disease Father   . Diabetes Father   . Stroke Father     Social History Social History  Substance Use Topics  . Smoking status: Former Smoker -- 0.75 packs/day for 16 years    Types: Cigarettes  . Smokeless tobacco: None  . Alcohol Use: 0.0 oz/week    0 Standard drinks or equivalent per week     Comment: occasional glass of  wine    Review of Systems  Constitutional: No fever/chills. Eyes: No visual changes. ENT:  No sore throat. Cardiovascular: Denies chest pain. Respiratory: Denies shortness of breath. Gastrointestinal: No abdominal pain.  No nausea, no vomiting.  No diarrhea.  No constipation. Genitourinary: Negative for dysuria. Musculoskeletal: Positive for back pain. Skin: Negative for rash. Neurological: Negative for headaches, focal weakness or numbness.  10-point ROS otherwise negative.  ____________________________________________   PHYSICAL EXAM:  VITAL SIGNS: ED Triage Vitals  Enc Vitals Group     BP 06/26/15 0000 171/100 mmHg     Pulse Rate 06/26/15 0000 87     Resp 06/26/15 0000 20     Temp 06/26/15 0000 98.1 F (36.7 C)     Temp Source 06/26/15 0000 Oral     SpO2 06/26/15 0000 97 %     Weight 06/26/15 0000 147 lb (66.679 kg)     Height 06/26/15 0000 5\' 1"  (1.549 m)     Head Cir --      Peak Flow --      Pain Score 06/25/15 2357 8     Pain Loc --      Pain Edu? --      Excl. in [place]? --     Constitutional: Alert and oriented. Well appearing and in mild acute distress. Eyes: Conjunctivae are normal. PERRL. EOMI. Head: Atraumatic. Nose: No congestion/rhinnorhea. Mouth/Throat: Mucous membranes are moist.  Oropharynx non-erythematous. Neck: No stridor.  No cervical spine tenderness to palpation. Cardiovascular: Normal rate, regular rhythm. Grossly normal heart sounds.  Good peripheral circulation. Respiratory: Normal respiratory effort.  No retractions. Lungs CTAB. Gastrointestinal: Soft and nontender. No distention. No abdominal bruits. No CVA tenderness. Musculoskeletal: No spinal tenderness to palpation. Right lumbar paraspinal muscle spasms. Limited range of motion secondary to pain. Negative straight leg raise bilaterally. No lower extremity tenderness nor edema.  No joint effusions. Neurologic:  Normal speech and language. No gross focal neurologic deficits are  appreciated. No gait instability. Skin:  Skin is warm, dry and intact. No rash noted. Psychiatric: Mood and affect are normal. Speech and behavior are normal.  ____________________________________________   LABS (all labs ordered are listed, but only abnormal results are displayed)  Labs Reviewed - No data to display ____________________________________________  EKG  None ____________________________________________  RADIOLOGY  None ____________________________________________   PROCEDURES  Procedure(s) performed: None  Critical Care performed: No  ____________________________________________   INITIAL IMPRESSION / ASSESSMENT AND PLAN / ED COURSE  Pertinent labs & imaging results that were available during my care of the patient were reviewed by me and considered in my medical decision making (see chart for details).  40 year old female who presents with acute on chronic back pain. She has no focal neurological deficits on exam. Will administer IM Dilaudid and Valium for breakthrough pain; hold NSAIDs as patient has taken several prior to arrival. Will also apply lidocaine patch.  ----------------------------------------- 2:34 AM on 06/26/2015 -----------------------------------------  Patient is feeling better. Will write prescription for lidocaine patch as well as limited quantity of Valium. Patient will follow-up with her pain clinic doctor this week. Strict return precautions given. Patient verbalizes understanding and agrees with plan of care. ____________________________________________   FINAL CLINICAL IMPRESSION(S) / ED DIAGNOSES  Final diagnoses:  Right-sided low back pain without sciatica  Chronic back pain      NEW MEDICATIONS STARTED DURING THIS VISIT:  New Prescriptions   No medications on file     Note:  This document was prepared using Dragon voice recognition software and may include unintentional dictation errors.    Paulette Blanch,  MD 06/26/15 [phone]

## 2015-07-08 ENCOUNTER — Telehealth: Payer: Self-pay

## 2015-07-08 NOTE — Telephone Encounter (Signed)
Patient called and states she is in severe pain and has been having to go to ED and would like to please get her RF approved.  States when called last week she was told that rf's were behind in getting approved.  Is there anyway that we can give her an update on where she stands.  Thank you

## 2015-07-08 NOTE — Telephone Encounter (Signed)
Pt says she does not know what else to do she went to the emergency room and she is still in pain. Pt wants Kori to call her back.

## 2015-07-09 NOTE — Telephone Encounter (Signed)
I spoke with Ms. Kinderknecht and scheduled her for the next available RFA date 08/08/2015. She also wanted to schedule an appointment to discuss other options for her pain until she can have the RFA - scheduled 07/15/2015

## 2015-07-12 ENCOUNTER — Telehealth: Payer: Self-pay | Admitting: Pain Medicine

## 2015-07-12 NOTE — Telephone Encounter (Signed)
Copper called to speak with [facility]

## 2015-07-15 ENCOUNTER — Encounter: Payer: Self-pay | Admitting: Pain Medicine

## 2015-07-15 ENCOUNTER — Telehealth: Payer: Self-pay

## 2015-07-15 ENCOUNTER — Ambulatory Visit: Payer: Managed Care, Other (non HMO) | Attending: Pain Medicine | Admitting: Pain Medicine

## 2015-07-15 ENCOUNTER — Other Ambulatory Visit
Admission: RE | Admit: 2015-07-15 | Discharge: 2015-07-15 | Disposition: A | Discharge status: Home / Self Care | Payer: Managed Care, Other (non HMO) | Source: Ambulatory Visit | Attending: Pain Medicine | Admitting: Pain Medicine

## 2015-07-15 VITALS — BP 133/76 | HR 80 | Temp 97.8°F | Resp 16 | Ht 61.0 in | Wt 147.0 lb

## 2015-07-15 DIAGNOSIS — M533 Sacrococcygeal disorders, not elsewhere classified: Secondary | ICD-10-CM | POA: Insufficient documentation

## 2015-07-15 DIAGNOSIS — M961 Postlaminectomy syndrome, not elsewhere classified: Secondary | ICD-10-CM

## 2015-07-15 DIAGNOSIS — K529 Noninfective gastroenteritis and colitis, unspecified: Secondary | ICD-10-CM | POA: Insufficient documentation

## 2015-07-15 DIAGNOSIS — M5481 Occipital neuralgia: Secondary | ICD-10-CM | POA: Insufficient documentation

## 2015-07-15 DIAGNOSIS — G43009 Migraine without aura, not intractable, without status migrainosus: Secondary | ICD-10-CM | POA: Insufficient documentation

## 2015-07-15 DIAGNOSIS — M25559 Pain in unspecified hip: Secondary | ICD-10-CM | POA: Diagnosis present

## 2015-07-15 DIAGNOSIS — R635 Abnormal weight gain: Secondary | ICD-10-CM | POA: Insufficient documentation

## 2015-07-15 DIAGNOSIS — E559 Vitamin D deficiency, unspecified: Secondary | ICD-10-CM | POA: Insufficient documentation

## 2015-07-15 DIAGNOSIS — Z5181 Encounter for therapeutic drug level monitoring: Secondary | ICD-10-CM | POA: Diagnosis not present

## 2015-07-15 DIAGNOSIS — G8929 Other chronic pain: Secondary | ICD-10-CM | POA: Diagnosis not present

## 2015-07-15 DIAGNOSIS — M47816 Spondylosis without myelopathy or radiculopathy, lumbar region: Secondary | ICD-10-CM | POA: Diagnosis not present

## 2015-07-15 DIAGNOSIS — F329 Major depressive disorder, single episode, unspecified: Secondary | ICD-10-CM | POA: Diagnosis not present

## 2015-07-15 DIAGNOSIS — E039 Hypothyroidism, unspecified: Secondary | ICD-10-CM | POA: Diagnosis not present

## 2015-07-15 DIAGNOSIS — Z87891 Personal history of nicotine dependence: Secondary | ICD-10-CM | POA: Diagnosis not present

## 2015-07-15 DIAGNOSIS — Z79891 Long term (current) use of opiate analgesic: Secondary | ICD-10-CM | POA: Insufficient documentation

## 2015-07-15 DIAGNOSIS — M62838 Other muscle spasm: Secondary | ICD-10-CM | POA: Diagnosis not present

## 2015-07-15 DIAGNOSIS — N926 Irregular menstruation, unspecified: Secondary | ICD-10-CM | POA: Diagnosis not present

## 2015-07-15 DIAGNOSIS — M539 Dorsopathy, unspecified: Secondary | ICD-10-CM

## 2015-07-15 DIAGNOSIS — Q741 Congenital malformation of knee: Secondary | ICD-10-CM | POA: Insufficient documentation

## 2015-07-15 DIAGNOSIS — M545 Low back pain: Secondary | ICD-10-CM

## 2015-07-15 DIAGNOSIS — Z6829 Body mass index (BMI) 29.0-29.9, adult: Secondary | ICD-10-CM | POA: Insufficient documentation

## 2015-07-15 DIAGNOSIS — M5126 Other intervertebral disc displacement, lumbar region: Secondary | ICD-10-CM | POA: Diagnosis not present

## 2015-07-15 DIAGNOSIS — F419 Anxiety disorder, unspecified: Secondary | ICD-10-CM | POA: Insufficient documentation

## 2015-07-15 DIAGNOSIS — M5136 Other intervertebral disc degeneration, lumbar region: Secondary | ICD-10-CM | POA: Diagnosis not present

## 2015-07-15 DIAGNOSIS — I10 Essential (primary) hypertension: Secondary | ICD-10-CM | POA: Insufficient documentation

## 2015-07-15 DIAGNOSIS — M549 Dorsalgia, unspecified: Secondary | ICD-10-CM | POA: Diagnosis present

## 2015-07-15 DIAGNOSIS — M5137 Other intervertebral disc degeneration, lumbosacral region: Secondary | ICD-10-CM | POA: Diagnosis not present

## 2015-07-15 LAB — COMPREHENSIVE METABOLIC PANEL
ALT: 12 U/L — ABNORMAL LOW (ref 14–54)
AST: 18 U/L (ref 15–41)
Albumin: 4.1 g/dL (ref 3.5–5.0)
Alkaline Phosphatase: 41 U/L (ref 38–126)
Anion gap: 9 (ref 5–15)
BUN: 20 mg/dL (ref 6–20)
CO2: 23 mmol/L (ref 22–32)
Calcium: 9.1 mg/dL (ref 8.9–10.3)
Chloride: 104 mmol/L (ref 101–111)
Creatinine, Ser: 0.82 mg/dL (ref 0.44–1.00)
GFR calc Af Amer: >60 mL/min (ref >60)
GFR calc non Af Amer: >60 mL/min (ref >60)
Glucose, Bld: 101 mg/dL — ABNORMAL HIGH (ref 65–99)
Potassium: 3.4 mmol/L — ABNORMAL LOW (ref 3.5–5.1)
Sodium: 136 mmol/L (ref 135–145)
Total Bilirubin: 0.8 mg/dL (ref 0.3–1.2)
Total Protein: 7.2 g/dL (ref 6.5–8.1)

## 2015-07-15 LAB — VITAMIN B12: Vitamin B-12: 2587 pg/mL — ABNORMAL HIGH (ref 180–914)

## 2015-07-15 LAB — SEDIMENTATION RATE: Sed Rate: 12 mm/hr (ref 0–20)

## 2015-07-15 LAB — MAGNESIUM: Magnesium: 2.1 mg/dL (ref 1.7–2.4)

## 2015-07-15 LAB — C-REACTIVE PROTEIN: CRP: 0.9 mg/dL (ref <1.0)

## 2015-07-15 NOTE — Progress Notes (Signed)
Patient's Name: Stacy Stephenson  Patient type: Established  MRN: [id number]  Service setting: Ambulatory outpatient  DOB: [birth year]  Location: [facility]  DOS: 07/15/2015  Primary Care Physician: Cathlean Cower, MD  Note by: Kathlen Brunswick. Dossie Arbour, M.D, DABA, Sac, DABPM, DABIPP, FIPP  Referring Physician: Biagio Borg, MD  Specialty: Board-Certified Interventional Pain Management  Last Visit to Pain Management: 07/12/2015   Primary Reason(s) for Visit: Encounter for prescription drug management (Level of risk: moderate) CC: Back Pain and Hip Pain   HPI  Stacy Stephenson is a 40 y.o. year old, female patient, who returns today as an established patient. She has DEPRESSION; MIGRAINE, COMMON; HYPERTENSION; GASTROENTERITIS, ACUTE; Anxiety state; Adult hypothyroidism; Avitaminosis D; Abnormal weight gain; Chronic pain; Long term current use of opiate analgesic; Long term prescription opiate use; Opiate use (22.5 MME/Day); Encounter for therapeutic drug level monitoring; Encounter for chronic pain management; Failed back surgical syndrome; Chronic low back pain (Location of Primary Source of Pain) (Bilateral) (R>L); Lumbar spondylosis; Lumbar facet syndrome (Bilateral) (R>L); Chronic sacroiliac joint pain (Right); Neurogenic pain; Neuropathic pain; Muscle spasm; Occipital headache; Bilateral occipital neuralgia; and Excessive and frequent menstruation with irregular cycle on her problem list.. Her primarily concern today is the Back Pain and Hip Pain   Pain Assessment: Self-Reported Pain Score: 4  Reported level is compatible with observation Pain Type: Chronic pain Pain Location: Back (hips, leg weakness) Pain Descriptors / Indicators: Sharp (legs are nagging, back is sharp, hips are penetrating to bone.) Pain Frequency: Constant  The patient comes into the clinics today for pharmacological management of her chronic pain. I last saw this patient on 07/12/2015. The patient  reports  that she does not use illicit drugs. Her body mass index is 27.79 kg/(m^2). The patient is pending radiofrequency of the right sacroiliac joints & right lumbar facet on July 6.  Date of Last Visit: 06/20/15 Service Provided on Last Visit: Evaluation  Controlled Substance Pharmacotherapy Assessment & REMS (Risk Evaluation and Mitigation Strategy)  Analgesic: Oxycodone IR 5 mg by mouth twice a day (15 mg/day) Pill Count: The patient did not bring her pills to her appointment. Today I have given her a final warning with regards to this. MME/day: 22.5 mg/day.  Date of Last Visit: 06/20/15 Pharmacokinetics: Onset of action (Liberation/Absorption): Within expected pharmacological parameters Time to Peak effect (Distribution): Timing and results are as within normal expected parameters Duration of action (Metabolism/Excretion): Within normal limits for medication Pharmacodynamics: Analgesic Effect: More than 50% Activity Facilitation: Medication(s) allow patient to sit, stand, walk, and do the basic ADLs Perceived Effectiveness: Described as relatively effective, allowing for increase in activities of daily living (ADL) Side-effects or Adverse reactions: None reported Monitoring: [place] PMP: Online review of the past 82-month period conducted. Compliant with practice rules and regulations Last UDS on record: TOXASSURE SELECT 13  Date Value Ref Range Status  05/08/2015 FINAL  Final    Comment:    ==================================================================== TOXASSURE SELECT 13 (MW) ==================================================================== Test                             Result       Flag       Units Drug Present and Declared for Prescription Verification   7-aminoclonazepam              287          EXPECTED   ng/mg creat    7-aminoclonazepam is an  expected metabolite of clonazepam. Source    of clonazepam is a scheduled prescription medication. Drug Absent but Declared for  Prescription Verification   Oxycodone                      Not Detected UNEXPECTED ng/mg creat ==================================================================== Test                      Result    Flag   Units      Ref Range   Creatinine              90               mg/dL      >=20 ==================================================================== Declared Medications:  The flagging and interpretation on this report are based on the  following declared medications.  Unexpected results may arise from  inaccuracies in the declared medications.  **Note: The testing scope of this panel includes these medications:  Clonazepam (Klonopin)  Oxycodone  **Note: The testing scope of this panel does not include following  reported medications:  Cyclobenzaprine (Flexeril)  Gabapentin (Neurontin)  Hydrochlorothiazide (Hydrodiuril)  Ibuprofen  Levothyroxine  Magnesium (Mag-Ox)  Methocarbamol (Robaxin)  Metoprolol (Lopressor)  Ondansetron (Zofran)  Vitamin D2 (Drisdol) ==================================================================== For clinical consultation, please call [phone]. ====================================================================    UDS interpretation: Non-Compliant. No oxycodone found and on the sample. This is a second time that this occurs. Medication Assessment Form: Reviewed. Patient indicates being compliant with therapy Treatment compliance: Compliant Risk Assessment: Aberrant Behavior: None observed today Substance Use Disorder (SUD) Risk Level: High. Risk of opioid abuse or dependence: 0.7-3.0% with doses ? 36 MME/day and 6.1-26% with doses ? 120 MME/day. Opioid Risk Tool (ORT) Score:  1 Low Risk for SUD (Score <3) Depression Scale Score: PHQ-2: PHQ-2 Total Score: 1 15.4% Probability of major depressive disorder (1) PHQ-9: PHQ-9 Total Score: 1 No depression (0-4)  Pharmacologic Plan: No change in therapy, at this time  Laboratory Chemistry   Inflammation Markers No results found for: ESRSEDRATE, CRP  Renal Function Lab Results  Component Value Date   BUN 20* 03/29/2013   CREATININE 1.15 03/29/2013   GFRAA >60 03/29/2013   GFRNONAA >60 03/29/2013    Hepatic Function Lab Results  Component Value Date   AST 26 02/12/2012   ALT 30 02/12/2012   ALBUMIN 4.4 02/12/2012    Electrolytes Lab Results  Component Value Date   NA 137 03/29/2013   K 3.9 03/29/2013   CL 106 03/29/2013   CALCIUM 9.6 03/29/2013    Pain Modulating Vitamins No results found for: [place], VD125OH2TOT, [id number], [id number], VITAMINB12  Coagulation Parameters Lab Results  Component Value Date   PLT 243 03/29/2013    Note: Labs Reviewed.  Recent Diagnostic Imaging  Thoracic Imaging: Thoracic DG 2-3 views:  Results for orders placed in visit on 05/15/12  DG Thoracic Spine 2 View   Narrative * PRIOR REPORT IMPORTED FROM AN EXTERNAL SYSTEM *   PRIOR REPORT IMPORTED FROM THE SYNGO [place] FOR EXAM:    FALL, BACK PAIN  COMMENTS:   May transport without cardiac monitor   PROCEDURE:     DXR - DXR THORACIC  AP AND LATERAL  - May 16 2012 12:48AM   RESULT:     Thoracic spine images demonstrate no underlying congenital  abnormality. The upper thoracic region is poorly demonstrated but shows  grossly normal alignment on the swimmer's view. The vertebral body heights  and intervertebral disc spaces appear to be maintained.   IMPRESSION:      No acute thoracic spine bony abnormality evident. If an  occult fractures are concern then further investigation with MRI would be  recommended.   Dictation Site: 2       Lumbar Imaging: Lumbar MR wo contrast:  Results for orders placed in visit on 10/08/12  MR L Spine Ltd W/O Cm   Narrative * PRIOR REPORT IMPORTED FROM AN EXTERNAL SYSTEM *   PRIOR REPORT IMPORTED FROM THE SYNGO WORKFLOW SYSTEM   REASON FOR EXAM:    low back pain eval for HNP  COMMENTS:   PROCEDURE:     MR   - MR LUMBAR SPINE WO CONTRAST  - Oct 08 2012  2:53PM   RESULT:     MR imaging of the lumbar spine in the sagittal and axial  planes  is performed. There is degenerative disc signal change at L5-S1 with some  mild annular disc bulge at that level as well as at L4-L5. The spinal  canal  is widely patent without significant thecal sac deformity. There some mild  areas of hypertrophy of the ligamentum flavum including L5 and L4. There  some mild facet arthropathy on the left at the L4-L5 level. The conus  medullaris terminates at the L1 level. The included images of the kidneys  and aorta are unremarkable. There is no significant foraminal narrowing  demonstrated. No extraforaminal disc herniation is appreciated. The marrow  signal is normal. There is some mild right L5-S1 foraminal narrowing from  the annular disc bulge. The marrow signal is normal. The spinal alignment  is  maintained.   IMPRESSION:  1. Mild diffuse disc bulge at L5-S1 and to a lesser extent at L4-L5 with  degenerative disc signal change at L5-S1.   Dictation Site: 6       Lumbar CT w contrast:  Results for orders placed in visit on 06/27/13  CT Lumbar Spine W Contrast   Narrative * PRIOR REPORT IMPORTED FROM AN EXTERNAL SYSTEM *   CLINICAL DATA:  Post lumbar discogram.  Low back pain.   EXAM:  CT LUMBAR SPINE WITH CONTRAST   TECHNIQUE:  Multidetector CT imaging of the lumbar spine was performed after  intra discal contrast was injected at an outside location.  Multiplanar CT image reconstructions were also generated.   COMPARISON:  MRI 10/08/2012   FINDINGS:  No abnormality is seen at T12-L1, L1-2 or L2-3. Those levels were  not injected.   L3-4: Normal nuclear pattern. No bulge or herniation. No stenosis.   L4-5: Intradiscal contrast is present in the right side of the  nuclear region and in the right lateral and anterior inner annulus.  There is some contrast in the foramen that probably relates to   needle placement. I do not suspect that there is evidence of disc  disease based on these images, but the central nucleus is not  opacified.   L5-S1: There is a small amount of contrast in the central nuclear  region. There is more contrast in the right side of the nuclear  region and the right annulus. There is some contrast in the right  foraminal region which could relate to needle placement/peripheral  injection or to a true annular tear. There does appear to be right  posterior lateral protrusion of disc material which displaces the  right S1 nerve root.   IMPRESSION:  L3-4:  Normal appearance.   L4-5: Somewhat  peripheral right-sided injection. No demonstrable  pathology. Contrast in the foramen on the right probably relates to  needle placement.   L5-S1: Somewhat peripheral right-sided injection, but with some  central nuclear contrast. Right posterior lateral disc protrusion  displaces the right S1 nerve root. Contrast in the foramen on the  right could relate to the peripheral injection or annular tearing.    Electronically Signed    By: Nelson Chimes M.D.    On: 06/28/2013 08:28       Lumbar DG 2-3 views:  Results for orders placed in visit on 05/15/12  DG Lumbar Spine 2-3 Views   Narrative * PRIOR REPORT IMPORTED FROM AN EXTERNAL SYSTEM *   PRIOR REPORT IMPORTED FROM THE SYNGO WORKFLOW SYSTEM   REASON FOR EXAM:    FALL, BACK PAIN  COMMENTS:   May transport without cardiac monitor   PROCEDURE:     DXR - DXR LUMBAR SPINE AP AND LATERAL  - May 16 2012  12:48AM   RESULT:     Lumbar spine images show 5 lumbar type vertebral bodies with  preservation of the alignment, disc spaces and vertebral body heights. No  fracture is evident. No foreign body is demonstrated. No congenital  abnormality is evident.   IMPRESSION:  1. No focal abnormality demonstrated.   Dictation Site: 2       Knee Imaging: [id number] MR wo contrast:  Results for orders placed in visit on  10/24/11  MR Knee Right Wo Contrast   Narrative * PRIOR REPORT IMPORTED FROM AN EXTERNAL SYSTEM *   PRIOR REPORT IMPORTED FROM THE SYNGO WORKFLOW SYSTEM   REASON FOR EXAM:    Rt knee pain limited range of motion  COMMENTS:   PROCEDURE:     MR  - MR KNEE RT  WO  CONTRAST  - Oct 24 2011  2:25PM   RESULT:     Axial, sagittal and coronal MR imaging of the right knee is  performed. 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 There is a large area of  edema and osteochondral defect in the medial femoral condyle. The marrow  signal is otherwise unremarkable. The medial and lateral collateral  ligamentous complexes are intact. The quadriceps tendon and patellar  ligament appear to be intact. The ACL and PCL are intact. The anterior and  posterior horns of the lateral meniscus appear normal. A definite medial  meniscal tear is not evident. No significant joint effusion is seen. A  small  amount of fluid is present.   IMPRESSION:  1. Large osteochondral defect and edema within the medial femoral condyle.  2. Bipartite patella.   Thank you for the opportunity to contribute to the care of your patient.   Dictation Site: 6       Knee-R DG 4 views:  Results for orders placed in visit on 09/28/11  DG Knee Complete 4 Views Right   Narrative * PRIOR REPORT IMPORTED FROM AN EXTERNAL SYSTEM *   PRIOR REPORT IMPORTED FROM THE SYNGO WORKFLOW SYSTEM   REASON FOR EXAM:    painful  COMMENTS:   PROCEDURE:     DXR - DXR KNEE RT COMP WITH OBLIQUES  - Sep 28 2011 11:10PM   RESULT:     Comparison: None.   Findings:  No acute fracture. Normal alignment. Possible small knee effusion. There  is  a bipartite patella.   IMPRESSION:  No acute fracture.  Possible small knee effusion.   Dictation site: 2        Meds  The patient has a current medication list which includes the following prescription(s): clonazepam,  cyclobenzaprine, diazepam, gabapentin, gabapentin, hydrochlorothiazide, ibuprofen, junel 1/20, levothyroxine, levothyroxine, lidocaine, magnesium oxide, methocarbamol, metoprolol tartrate, ondansetron, oxycodone, oxycodone, oxycodone, and vitamin d (ergocalciferol).  Current Outpatient Prescriptions on File Prior to Visit  Medication Sig  . clonazePAM (KLONOPIN) 2 MG tablet   . cyclobenzaprine (FLEXERIL) 10 MG tablet Take 1 tablet (10 mg total) by mouth at bedtime.  . diazepam (VALIUM) 2 MG tablet Take 1 tablet (2 mg total) by mouth every 8 (eight) hours as needed for muscle spasms.  [name] gabapentin (NEURONTIN) 100 MG capsule Take 1-3 capsules (100-300 mg total) by mouth 3 (three) times daily. Follow titration schedule.  . gabapentin (NEURONTIN) 800 MG tablet Take 1 tablet (800 mg total) by mouth at bedtime.  . hydrochlorothiazide (HYDRODIURIL) 25 MG tablet Take 25 mg by mouth every other day. Mon, tues, wed, thurs, fri  . ibuprofen (ADVIL,MOTRIN) 800 MG tablet Take 800 mg by mouth every 8 (eight) hours as needed.   Lenda Kelp 1/20 1-20 MG-MCG tablet Take 1 tablet by mouth daily.  [name] levothyroxine (SYNTHROID, LEVOTHROID) 25 MCG tablet Take 25 mcg by mouth daily before breakfast. saturday  . levothyroxine (SYNTHROID, LEVOTHROID) 50 MCG tablet Take 50 mcg by mouth daily before breakfast. Monday - Friday  . lidocaine (LIDODERM) 5 % Place 1 patch onto the skin daily. Remove & Discard patch within 12 hours or as directed by MD  . magnesium oxide (MAG-OX) 400 MG tablet Take 400 mg by mouth daily.   . methocarbamol (ROBAXIN) 500 MG tablet Take 1 tablet (500 mg total) by mouth 2 (two) times daily as needed for muscle spasms.  . metoprolol tartrate (LOPRESSOR) 25 MG tablet Take 50 mg by mouth at bedtime.   . ondansetron (ZOFRAN-ODT) 8 MG disintegrating tablet Take by mouth every 8 (eight) hours as needed.   [name] oxyCODONE (OXY IR/ROXICODONE) 5 MG immediate release tablet Take 1 tablet (5 mg total) by mouth every 8  (eight) hours as needed for severe pain.  [name] oxyCODONE (OXY IR/ROXICODONE) 5 MG immediate release tablet Take 1 tablet (5 mg total) by mouth every 8 (eight) hours as needed for severe pain.  [name] oxyCODONE (OXY IR/ROXICODONE) 5 MG immediate release tablet Take 1 tablet (5 mg total) by mouth every 8 (eight) hours as needed for severe pain.  . Vitamin D, Ergocalciferol, (DRISDOL) 50000 units CAPS capsule Take 50,000 Units by mouth every 7 (seven) days.    No current facility-administered medications on file prior to visit.    ROS  Constitutional: Denies any fever or chills Gastrointestinal: No reported hemesis, hematochezia, vomiting, or acute GI distress Musculoskeletal: Denies any acute onset joint swelling, redness, loss of ROM, or weakness Neurological: No reported episodes of acute onset apraxia, aphasia, dysarthria, agnosia, amnesia, paralysis, loss of coordination, or loss of consciousness  Allergies  Ms. Senter is allergic to fluoxetine hcl; venlafaxine; zolpidem tartrate; and zolpidem tartrate.  [place]  Medical:  Ms. Wambach  has a past medical history of Hypothyroidism; Depression; Migraines; Hypertension; Fibromyalgia; and Cancer ([place]). Family: family history includes Arthritis in her mother; Diabetes in her father; Heart disease in her father; Stroke in her father. Surgical:  has past surgical history that includes Breast surgery and Microdiscectomy lumbar. Tobacco:  reports that she has quit smoking. Her smoking use included Cigarettes. She has a 12 pack-year smoking  history. She does not have any smokeless tobacco history on file. Alcohol:  reports that she drinks alcohol. Drug:  reports that she does not use illicit drugs.  Constitutional Exam  Vitals: Blood pressure 133/76, pulse 80, temperature 97.8 F (36.6 C), resp. rate 16, height 5\' 1"  (1.549 m), weight 147 lb (66.679 kg), last menstrual period 06/22/2015, SpO2 100 %. General appearance: Well nourished, well developed, and  well hydrated. In no acute distress Calculated BMI/Body habitus: Body mass index is 27.79 kg/(m^2). (25-29.9 kg/m2) Overweight - 20% higher incidence of chronic pain Psych/Mental status: Alert and oriented x 3 (person, place, & time) Eyes: PERLA Respiratory: No evidence of acute respiratory distress  Cervical Spine Exam  Inspection: No masses, redness, or swelling Alignment: Symmetrical ROM: Functional: ROM is within functional limits [facility]) Stability: No instability detected Muscle strength & Tone: Functionally intact Sensory: Unimpaired Palpation: No complaints of tenderness  Upper Extremity (UE) Exam    Side: Right upper extremity  Side: Left upper extremity  Inspection: No masses, redness, swelling, or asymmetry  Inspection: No masses, redness, swelling, or asymmetry  ROM:  ROM:  Functional: ROM is within functional limits [facility])  Functional: ROM is within functional limits [facility])  Muscle strength & Tone: Functionally intact  Muscle strength & Tone: Functionally intact  Sensory: Unimpaired  Sensory: Unimpaired  Palpation: Non-contributory  Palpation: Non-contributory   Thoracic Spine Exam  Inspection: No masses, redness, or swelling Alignment: Symmetrical ROM: Functional: ROM is within functional limits [facility]) Stability: No instability detected Sensory: Unimpaired Muscle strength & Tone: Functionally intact Palpation: No complaints of tenderness  Lumbar Spine Exam  Inspection: No masses, redness, or swelling Alignment: Symmetrical ROM: Functional: Decreased ROM Stability: No instability detected Muscle strength & Tone: Functionally intact Sensory: Unimpaired Palpation: Tender Provocative Tests: Lumbar Hyperextension and rotation test: Positive for bilateral lumbar facet pain Patrick's Maneuver: Positive for bilateral sacroiliac joint pain, as well as mild bilateral hip joint pain.  Gait & Posture Assessment  Ambulation: Unassisted Gait: Antalgic Posture:  WNL  Lower Extremity Exam    Side: Right lower extremity  Side: Left lower extremity  Inspection: No masses, redness, swelling, or asymmetry ROM:  Inspection: No masses, redness, swelling, or asymmetry ROM:  Functional: ROM is within functional limits [facility])  Functional: ROM is within functional limits [facility])  Muscle strength & Tone: Functionally intact  Muscle strength & Tone: Functionally intact  Sensory: Unimpaired  Sensory: Unimpaired  Palpation: Non-contributory  Palpation: Non-contributory   Assessment & Plan  Primary Diagnosis & Pertinent Problem List: The primary encounter diagnosis was Chronic pain. Diagnoses of Encounter for therapeutic drug level monitoring, Long term current use of opiate analgesic, Chronic sacroiliac joint pain (Right), Chronic low back pain (Location of Primary Source of Pain) (Bilateral) (R>L), Lumbar facet syndrome (Bilateral) (R>L), and Failed back surgical syndrome were also pertinent to this visit.  Visit Diagnosis: 1. Chronic pain   2. Encounter for therapeutic drug level monitoring   3. Long term current use of opiate analgesic   4. Chronic sacroiliac joint pain (Right)   5. Chronic low back pain (Location of Primary Source of Pain) (Bilateral) (R>L)   6. Lumbar facet syndrome (Bilateral) (R>L)   7. Failed back surgical syndrome     Problems updated and reviewed during this visit: No problems updated.  Problem-specific Plan(s): No problem-specific assessment & plan notes found for this encounter.  No new assessment & plan notes have been filed under this hospital service since the last note was generated. Service: Pain  Management   Plan of Care   Problem List Items Addressed This Visit      High   Chronic low back pain (Location of Primary Source of Pain) (Bilateral) (R>L) (Chronic)   Relevant Orders   MR Lumbar Spine W Wo Contrast   LUMBAR FACET(MEDIAL BRANCH NERVE BLOCK) MBNB   SACROILIAC JOINT INJECTINS   Chronic pain - Primary  (Chronic)   Relevant Orders   Comprehensive metabolic panel   C-reactive protein   Magnesium   Sedimentation rate   Vitamin B12   25-Hydroxyvitamin D Lcms D2+D3   Chronic sacroiliac joint pain (Right) (Chronic)   Relevant Orders   SACROILIAC JOINT INJECTINS   Failed back surgical syndrome (Chronic)   Relevant Orders   MR Lumbar Spine W Wo Contrast   Lumbar facet syndrome (Bilateral) (R>L) (Chronic)   Relevant Orders   LUMBAR FACET(MEDIAL BRANCH NERVE BLOCK) MBNB     Medium   Encounter for therapeutic drug level monitoring   Long term current use of opiate analgesic (Chronic)   Relevant Orders   ToxASSURE Select 13 (MW), Urine       Pharmacotherapy (Medications Ordered): No orders of the defined types were placed in this encounter.    Lab-work & Procedure Ordered: Orders Placed This Encounter  Procedures  . LUMBAR FACET(MEDIAL BRANCH NERVE BLOCK) MBNB  . SACROILIAC JOINT INJECTINS  . MR Lumbar Spine W Wo Contrast  . ToxASSURE Select 13 (MW), Urine  . Comprehensive metabolic panel  . C-reactive protein  . Magnesium  . Sedimentation rate  . Vitamin B12  . 25-Hydroxyvitamin D Lcms D2+D3    Imaging Ordered: MR LUMBAR SPINE W WO CONTRAST  Interventional Therapies: Scheduled:   1. Palliative bilateral lumbar facet + bilateral sacroiliac joint block under fluoroscopic guidance and IV sedation to provide her relief of the pain until she can come in for the radiofrequency. 2. Right sided sacroiliac joint + right sided lumbar facet radiofrequency ablation under fluoroscopic guidance and IV sedation.    Considering:  Pending to do both sides.    PRN Procedures:  Bilateral greater occipital nerve block under fluoroscopic guidance and IV sedation.    Referral(s) or Consult(s): None at this time.  New Prescriptions   No medications on file    Medications administered during this visit: Ms. Sutliff does not currently have medications on file.  Requested PM Follow-up:  Return for Procedure (Scheduled), Keep previously scheduled appointment..  Future Appointments Date Time Provider [place]  08/07/2015 9:00 AM Milinda Pointer, MD [facility] None  08/08/2015 10:40 AM Milinda Pointer, MD [facility] None    Primary Care Physician: Cathlean Cower, MD Location: [facility] Note by: Kathlen Brunswick. Dossie Arbour, M.D, DABA, DABAPM, DABPM, DABIPP, FIPP  Pain Score Disclaimer: We use the NRS-11 scale. This is a self-reported, subjective measurement of pain severity with only modest accuracy. It is used primarily to identify changes within a particular patient. It must be understood that outpatient pain scales are significantly less accurate that those used for research, where they can be applied under ideal controlled circumstances with minimal exposure to variables. In reality, the score is likely to be a combination of pain intensity and pain affect, where pain affect describes the degree of emotional arousal or changes in action readiness caused by the sensory experience of pain. Factors such as social and work situation, setting, emotional state, anxiety levels, expectation, and prior pain experience may influence pain perception and show large inter-individual differences that may also be  affected by time variables.  Patient instructions provided during this appointment: Patient Instructions  Sacroiliac (SI) Joint Injection Patient Information  Description: The sacroiliac joint connects the scrum (very low back and tailbone) to the ilium (a pelvic bone which also forms half of the hip joint).  Normally this joint experiences very little motion.  When this joint becomes inflamed or unstable low back and or hip and pelvis pain may result.  Injection of this joint with local anesthetics (numbing medicines) and steroids can provide diagnostic information and reduce pain.  This injection is performed with the aid of x-ray guidance into the  tailbone area while you are lying on your stomach.   You may experience an electrical sensation down the leg while this is being done.  You may also experience numbness.  We also may ask if we are reproducing your normal pain during the injection.  Conditions which may be treated SI injection:   Low back, buttock, hip or leg pain  Preparation for the Injection:  1. Do not eat any solid food or dairy products within 8 hours of your appointment.  2. You may drink clear liquids up to 3 hours before appointment.  Clear liquids include water, black coffee, juice or soda.  No milk or cream please. 3. You may take your regular medications, including pain medications with a sip of water before your appointment.  Diabetics should hold regular insulin (if take separately) and take 1/2 normal NPH dose the morning of the procedure.  Carry some sugar containing items with you to your appointment. 4. A driver must accompany you and be prepared to drive you home after your procedure. 5. Bring all of your current medications with you. 6. An IV may be inserted and sedation may be given at the discretion of the physician. 7. A blood pressure cuff, EKG and other monitors will often be applied during the procedure.  Some patients may need to have extra oxygen administered for a short period.  8. You will be asked to provide medical information, including your allergies, prior to the procedure.  We must know immediately if you are taking blood thinners (like Coumadin/Warfarin) or if you are allergic to IV iodine contrast (dye).  We must know if you could possible be pregnant.  Possible side effects:   Bleeding from needle site  Infection (rare, may require surgery)  Nerve injury (rare)  Numbness & tingling (temporary)  A brief convulsion or seizure  Light-headedness (temporary)  Pain at injection site (several days)  Decreased blood pressure (temporary)  Weakness in the leg (temporary)   Call if  you experience:   New onset weakness or numbness of an extremity below the injection site that last more than 8 hours.  Hives or difficulty breathing ( go to the emergency room)  Inflammation or drainage at the injection site  Any new symptoms which are concerning to you  Please note:  Although the local anesthetic injected can often make your back/ hip/ buttock/ leg feel good for several hours after the injections, the pain will likely return.  It takes 3-7 days for steroids to work in the sacroiliac area.  You may not notice any pain relief for at least that one week.  If effective, we will often do a series of three injections spaced 3-6 weeks apart to maximally decrease your pain.  After the initial series, we generally will wait some months before a repeat injection of the same type.  If you have any  questions, please call [phone] [facility]  Facet Blocks Patient Information  Description: The facets are joints in the spine between the vertebrae.  Like any joints in the body, facets can become irritated and painful.  Arthritis can also effect the facets.  By injecting steroids and local anesthetic in and around these joints, we can temporarily block the nerve supply to them.  Steroids act directly on irritated nerves and tissues to reduce selling and inflammation which often leads to decreased pain.  Facet blocks may be done anywhere along the spine from the neck to the low back depending upon the location of your pain.   After numbing the skin with local anesthetic (like Novocaine), a small needle is passed onto the facet joints under x-ray guidance.  You may experience a sensation of pressure while this is being done.  The entire block usually lasts about 15-25 minutes.   Conditions which may be treated by facet blocks:   Low back/buttock pain  Neck/shoulder pain  Certain types of headaches  Preparation for the injection:  10. Do not  eat any solid food or dairy products within 8 hours of your appointment. 11. You may drink clear liquid up to 3 hours before appointment.  Clear liquids include water, black coffee, juice or soda.  No milk or cream please. 12. You may take your regular medication, including pain medications, with a sip of water before your appointment.  Diabetics should hold regular insulin (if taken separately) and take 1/2 normal NPH dose the morning of the procedure.  Carry some sugar containing items with you to your appointment. 13. A driver must accompany you and be prepared to drive you home after your procedure. 66. Bring all your current medications with you. 15. An IV may be inserted and sedation may be given at the discretion of the physician. 16. A blood pressure cuff, EKG and other monitors will often be applied during the procedure.  Some patients may need to have extra oxygen administered for a short period. 60. You will be asked to provide medical information, including your allergies and medications, prior to the procedure.  We must know immediately if you are taking blood thinners (like Coumadin/Warfarin) or if you are allergic to IV iodine contrast (dye).  We must know if you could possible be pregnant.  Possible side-effects:   Bleeding from needle site  Infection (rare, may require surgery)  Nerve injury (rare)  Numbness & tingling (temporary)  Difficulty urinating (rare, temporary)  Spinal headache (a headache worse with upright posture)  Light-headedness (temporary)  Pain at injection site (serveral days)  Decreased blood pressure (rare, temporary)  Weakness in arm/leg (temporary)  Pressure sensation in back/neck (temporary)   Call if you experience:   Fever/chills associated with headache or increased back/neck pain  Headache worsened by an upright position  New onset, weakness or numbness of an extremity below the injection site  Hives or difficulty breathing (go  to the emergency room)  Inflammation or drainage at the injection site(s)  Severe back/neck pain greater than usual  New symptoms which are concerning to you  Please note:  Although the local anesthetic injected can often make your back or neck feel good for several hours after the injection, the pain will likely return. It takes 3-7 days for steroids to work.  You may not notice any pain relief for at least one week.  If effective, we will often do a series of 2-3 injections  spaced 3-6 weeks apart to maximally decrease your pain.  After the initial series, you may be a candidate for a more permanent nerve block of the facets.  If you have any questions, please call #[phone]) [facility]   INSTRUCTED TO GET [place].

## 2015-07-15 NOTE — Telephone Encounter (Signed)
Pt wants Kori to call her back regarding their discussion this morning about some paper work

## 2015-07-15 NOTE — Patient Instructions (Addendum)
Sacroiliac (SI) Joint Injection Patient Information  Description: The sacroiliac joint connects the scrum (very low back and tailbone) to the ilium (a pelvic bone which also forms half of the hip joint).  Normally this joint experiences very little motion.  When this joint becomes inflamed or unstable low back and or hip and pelvis pain may result.  Injection of this joint with local anesthetics (numbing medicines) and steroids can provide diagnostic information and reduce pain.  This injection is performed with the aid of x-ray guidance into the tailbone area while you are lying on your stomach.   You may experience an electrical sensation down the leg while this is being done.  You may also experience numbness.  We also may ask if we are reproducing your normal pain during the injection.  Conditions which may be treated SI injection:   Low back, buttock, hip or leg pain  Preparation for the Injection:  1. Do not eat any solid food or dairy products within 8 hours of your appointment.  2. You may drink clear liquids up to 3 hours before appointment.  Clear liquids include water, black coffee, juice or soda.  No milk or cream please. 3. You may take your regular medications, including pain medications with a sip of water before your appointment.  Diabetics should hold regular insulin (if take separately) and take 1/2 normal NPH dose the morning of the procedure.  Carry some sugar containing items with you to your appointment. 4. A driver must accompany you and be prepared to drive you home after your procedure. 5. Bring all of your current medications with you. 6. An IV may be inserted and sedation may be given at the discretion of the physician. 7. A blood pressure cuff, EKG and other monitors will often be applied during the procedure.  Some patients may need to have extra oxygen administered for a short period.  8. You will be asked to provide medical information, including your allergies,  prior to the procedure.  We must know immediately if you are taking blood thinners (like Coumadin/Warfarin) or if you are allergic to IV iodine contrast (dye).  We must know if you could possible be pregnant.  Possible side effects:   Bleeding from needle site  Infection (rare, may require surgery)  Nerve injury (rare)  Numbness & tingling (temporary)  A brief convulsion or seizure  Light-headedness (temporary)  Pain at injection site (several days)  Decreased blood pressure (temporary)  Weakness in the leg (temporary)   Call if you experience:   New onset weakness or numbness of an extremity below the injection site that last more than 8 hours.  Hives or difficulty breathing ( go to the emergency room)  Inflammation or drainage at the injection site  Any new symptoms which are concerning to you  Please note:  Although the local anesthetic injected can often make your back/ hip/ buttock/ leg feel good for several hours after the injections, the pain will likely return.  It takes 3-7 days for steroids to work in the sacroiliac area.  You may not notice any pain relief for at least that one week.  If effective, we will often do a series of three injections spaced 3-6 weeks apart to maximally decrease your pain.  After the initial series, we generally will wait some months before a repeat injection of the same type.  If you have any questions, please call [phone] [facility]  Facet Blocks Patient  Information  Description: The facets are joints in the spine between the vertebrae.  Like any joints in the body, facets can become irritated and painful.  Arthritis can also effect the facets.  By injecting steroids and local anesthetic in and around these joints, we can temporarily block the nerve supply to them.  Steroids act directly on irritated nerves and tissues to reduce selling and inflammation which often leads to decreased pain.   Facet blocks may be done anywhere along the spine from the neck to the low back depending upon the location of your pain.   After numbing the skin with local anesthetic (like Novocaine), a small needle is passed onto the facet joints under x-ray guidance.  You may experience a sensation of pressure while this is being done.  The entire block usually lasts about 15-25 minutes.   Conditions which may be treated by facet blocks:   Low back/buttock pain  Neck/shoulder pain  Certain types of headaches  Preparation for the injection:  10. Do not eat any solid food or dairy products within 8 hours of your appointment. 11. You may drink clear liquid up to 3 hours before appointment.  Clear liquids include water, black coffee, juice or soda.  No milk or cream please. 12. You may take your regular medication, including pain medications, with a sip of water before your appointment.  Diabetics should hold regular insulin (if taken separately) and take 1/2 normal NPH dose the morning of the procedure.  Carry some sugar containing items with you to your appointment. 13. A driver must accompany you and be prepared to drive you home after your procedure. 37. Bring all your current medications with you. 15. An IV may be inserted and sedation may be given at the discretion of the physician. 16. A blood pressure cuff, EKG and other monitors will often be applied during the procedure.  Some patients may need to have extra oxygen administered for a short period. 109. You will be asked to provide medical information, including your allergies and medications, prior to the procedure.  We must know immediately if you are taking blood thinners (like Coumadin/Warfarin) or if you are allergic to IV iodine contrast (dye).  We must know if you could possible be pregnant.  Possible side-effects:   Bleeding from needle site  Infection (rare, may require surgery)  Nerve injury (rare)  Numbness & tingling  (temporary)  Difficulty urinating (rare, temporary)  Spinal headache (a headache worse with upright posture)  Light-headedness (temporary)  Pain at injection site (serveral days)  Decreased blood pressure (rare, temporary)  Weakness in arm/leg (temporary)  Pressure sensation in back/neck (temporary)   Call if you experience:   Fever/chills associated with headache or increased back/neck pain  Headache worsened by an upright position  New onset, weakness or numbness of an extremity below the injection site  Hives or difficulty breathing (go to the emergency room)  Inflammation or drainage at the injection site(s)  Severe back/neck pain greater than usual  New symptoms which are concerning to you  Please note:  Although the local anesthetic injected can often make your back or neck feel good for several hours after the injection, the pain will likely return. It takes 3-7 days for steroids to work.  You may not notice any pain relief for at least one week.  If effective, we will often do a series of 2-3 injections spaced 3-6 weeks apart to maximally decrease your pain.  After the initial series, you  may be a candidate for a more permanent nerve block of the facets.  If you have any questions, please call #[phone]) [facility]   INSTRUCTED TO GET [place].

## 2015-07-15 NOTE — Progress Notes (Signed)
Safety precautions to be maintained throughout the outpatient stay will include: orient to surroundings, keep bed in low position, maintain call bell within reach at all times, provide assistance with transfer out of bed and ambulation.  

## 2015-07-16 ENCOUNTER — Encounter: Payer: Self-pay | Admitting: Pain Medicine

## 2015-07-16 ENCOUNTER — Ambulatory Visit: Payer: Managed Care, Other (non HMO) | Attending: Pain Medicine | Admitting: Pain Medicine

## 2015-07-16 VITALS — BP 164/97 | HR 74 | Temp 97.6°F | Resp 16 | Ht 61.5 in | Wt 147.0 lb

## 2015-07-16 DIAGNOSIS — N920 Excessive and frequent menstruation with regular cycle: Secondary | ICD-10-CM | POA: Diagnosis not present

## 2015-07-16 DIAGNOSIS — Z79891 Long term (current) use of opiate analgesic: Secondary | ICD-10-CM | POA: Insufficient documentation

## 2015-07-16 DIAGNOSIS — F329 Major depressive disorder, single episode, unspecified: Secondary | ICD-10-CM | POA: Insufficient documentation

## 2015-07-16 DIAGNOSIS — R51 Headache: Secondary | ICD-10-CM | POA: Diagnosis not present

## 2015-07-16 DIAGNOSIS — E039 Hypothyroidism, unspecified: Secondary | ICD-10-CM | POA: Insufficient documentation

## 2015-07-16 DIAGNOSIS — F411 Generalized anxiety disorder: Secondary | ICD-10-CM | POA: Diagnosis not present

## 2015-07-16 DIAGNOSIS — M545 Low back pain: Secondary | ICD-10-CM | POA: Diagnosis not present

## 2015-07-16 DIAGNOSIS — M533 Sacrococcygeal disorders, not elsewhere classified: Secondary | ICD-10-CM | POA: Diagnosis not present

## 2015-07-16 DIAGNOSIS — G43909 Migraine, unspecified, not intractable, without status migrainosus: Secondary | ICD-10-CM | POA: Insufficient documentation

## 2015-07-16 DIAGNOSIS — G8929 Other chronic pain: Secondary | ICD-10-CM | POA: Diagnosis not present

## 2015-07-16 DIAGNOSIS — M62838 Other muscle spasm: Secondary | ICD-10-CM | POA: Insufficient documentation

## 2015-07-16 DIAGNOSIS — M5481 Occipital neuralgia: Secondary | ICD-10-CM | POA: Diagnosis not present

## 2015-07-16 DIAGNOSIS — E559 Vitamin D deficiency, unspecified: Secondary | ICD-10-CM | POA: Insufficient documentation

## 2015-07-16 DIAGNOSIS — M47816 Spondylosis without myelopathy or radiculopathy, lumbar region: Secondary | ICD-10-CM | POA: Diagnosis not present

## 2015-07-16 DIAGNOSIS — M549 Dorsalgia, unspecified: Secondary | ICD-10-CM | POA: Diagnosis present

## 2015-07-16 DIAGNOSIS — R635 Abnormal weight gain: Secondary | ICD-10-CM | POA: Insufficient documentation

## 2015-07-16 DIAGNOSIS — I10 Essential (primary) hypertension: Secondary | ICD-10-CM | POA: Diagnosis not present

## 2015-07-16 MED ORDER — LACTATED RINGERS IV SOLN: 1000.0000 mL | Freq: Once | INTRAVENOUS | Status: DC

## 2015-07-16 MED ORDER — MIDAZOLAM HCL 5 MG/5ML IJ SOLN
1.0000 mg | INTRAMUSCULAR | Status: DC | PRN
  Filled 2015-07-16: qty 5

## 2015-07-16 MED ORDER — FENTANYL CITRATE (PF) 100 MCG/2ML IJ SOLN
25.0000 ug | INTRAMUSCULAR | Status: DC | PRN
  Filled 2015-07-16: qty 2

## 2015-07-16 MED ORDER — LIDOCAINE HCL (PF) 1 % IJ SOLN
10.0000 mL | Freq: Once | INTRAMUSCULAR | Status: AC
  Administered 2015-07-16: 10 mL

## 2015-07-16 MED ORDER — TRIAMCINOLONE ACETONIDE 40 MG/ML IJ SUSP
INTRAMUSCULAR | Status: AC
  Administered 2015-07-16: 14:00:00
  Filled 2015-07-16: qty 2

## 2015-07-16 MED ORDER — ROPIVACAINE HCL 2 MG/ML IJ SOLN
9.0000 mL | Freq: Once | INTRAMUSCULAR | Status: AC
  Administered 2015-07-16: 9 mL
  Filled 2015-07-16: qty 10

## 2015-07-16 MED ORDER — METHYLPREDNISOLONE ACETATE 80 MG/ML IJ SUSP
80.0000 mg | Freq: Once | INTRAMUSCULAR | Status: AC
  Administered 2015-07-16: 80 mg
  Filled 2015-07-16: qty 1

## 2015-07-16 MED ORDER — TRIAMCINOLONE ACETONIDE 40 MG/ML IJ SUSP
40.0000 mg | Freq: Once | INTRAMUSCULAR | Status: AC
  Administered 2015-07-16: 40 mg

## 2015-07-16 MED ORDER — ROPIVACAINE HCL 2 MG/ML IJ SOLN
9.0000 mL | Freq: Once | INTRAMUSCULAR | Status: AC
  Administered 2015-07-16: 9 mL
  Filled 2015-07-16: qty 20

## 2015-07-16 NOTE — Progress Notes (Signed)
1416 Versed 2 mg IV, Fentanyl 50 mcg IV 1418 Versed 2 mg IV, Fentanyl 50 mcg IV 1420 Versed 1 mg IV

## 2015-07-16 NOTE — Patient Instructions (Signed)
Facet Blocks Patient Information  Description: The facets are joints in the spine between the vertebrae.  Like any joints in the body, facets can become irritated and painful.  Arthritis can also effect the facets.  By injecting steroids and local anesthetic in and around these joints, we can temporarily block the nerve supply to them.  Steroids act directly on irritated nerves and tissues to reduce selling and inflammation which often leads to decreased pain.  Facet blocks may be done anywhere along the spine from the neck to the low back depending upon the location of your pain.   After numbing the skin with local anesthetic (like Novocaine), a small needle is passed onto the facet joints under x-ray guidance.  You may experience a sensation of pressure while this is being done.  The entire block usually lasts about 15-25 minutes.   Conditions which may be treated by facet blocks:   Low back/buttock pain  Neck/shoulder pain  Certain types of headaches  Preparation for the injection:  1. Do not eat any solid food or dairy products within 8 hours of your appointment. 2. You may drink clear liquid up to 3 hours before appointment.  Clear liquids include water, black coffee, juice or soda.  No milk or cream please. 3. You may take your regular medication, including pain medications, with a sip of water before your appointment.  Diabetics should hold regular insulin (if taken separately) and take 1/2 normal NPH dose the morning of the procedure.  Carry some sugar containing items with you to your appointment. 4. A driver must accompany you and be prepared to drive you home after your procedure. 5. Bring all your current medications with you. 6. An IV may be inserted and sedation may be given at the discretion of the physician. 7. A blood pressure cuff, EKG and other monitors will often be applied during the procedure.  Some patients may need to have extra oxygen administered for a short  period. 8. You will be asked to provide medical information, including your allergies and medications, prior to the procedure.  We must know immediately if you are taking blood thinners (like Coumadin/Warfarin) or if you are allergic to IV iodine contrast (dye).  We must know if you could possible be pregnant.  Possible side-effects:   Bleeding from needle site  Infection (rare, may require surgery)  Nerve injury (rare)  Numbness & tingling (temporary)  Difficulty urinating (rare, temporary)  Spinal headache (a headache worse with upright posture)  Light-headedness (temporary)  Pain at injection site (serveral days)  Decreased blood pressure (rare, temporary)  Weakness in arm/leg (temporary)  Pressure sensation in back/neck (temporary)   Call if you experience:   Fever/chills associated with headache or increased back/neck pain  Headache worsened by an upright position  New onset, weakness or numbness of an extremity below the injection site  Hives or difficulty breathing (go to the emergency room)  Inflammation or drainage at the injection site(s)  Severe back/neck pain greater than usual  New symptoms which are concerning to you  Please note:  Although the local anesthetic injected can often make your back or neck feel good for several hours after the injection, the pain will likely return. It takes 3-7 days for steroids to work.  You may not notice any pain relief for at least one week.  If effective, we will often do a series of 2-3 injections spaced 3-6 weeks apart to maximally decrease your pain.  After the initial   series, you may be a candidate for a more permanent nerve block of the facets.  If you have any questions, please call #[phone]) [place]: Please fill out the post procedure pain diary and bring it with you at your next appointment.  Be careful moving about. Muscle spasms in the area of the  injection may occur.  Use ice for the next 24 hours (15 minutes on, 15 minutes off).  After 24 hours, you may use heat for comfort if you wish.  Post procedure numbness or redness is expected, average 4-6 hours.  If numbness develops after 4-6 hours and is felt to be progressing and worsening, immediately contact your physician.   Pain Management Discharge Instructions  General Discharge Instructions :  If you need to reach your doctor call: Monday-Friday 8:00 am - 4:00 pm at [phone] or toll free [phone].  After clinic hours [phone] to have operator reach doctor.  Bring all of your medication bottles to all your appointments in the pain clinic.  To cancel or reschedule your appointment with Pain Management please remember to call 24 hours in advance to avoid a fee.  Refer to the educational materials which you have been given on: General Risks, I had my Procedure. Discharge Instructions, Post Sedation.  Post Procedure Instructions:  The drugs you were given will stay in your system until tomorrow, so for the next 24 hours you should not drive, make any legal decisions or drink any alcoholic beverages.  You may eat anything you prefer, but it is better to start with liquids then soups and crackers, and gradually work up to solid foods.  Please notify your doctor immediately if you have any unusual bleeding, trouble breathing or pain that is not related to your normal pain.  Depending on the type of procedure that was done, some parts of your body may feel week and/or numb.  This usually clears up by tonight or the next day.  Walk with the use of an assistive device or accompanied by an adult for the 24 hours.  You may use ice on the affected area for the first 24 hours.  Put ice in a Ziploc bag and cover with a towel and place against area 15 minutes on 15 minutes off.  You may switch to heat after 24 hours.

## 2015-07-16 NOTE — Progress Notes (Signed)
Safety precautions to be maintained throughout the outpatient stay will include: orient to surroundings, keep bed in low position, maintain call bell within reach at all times, provide assistance with transfer out of bed and ambulation.  

## 2015-07-16 NOTE — Progress Notes (Signed)
Patient's Name: Stacy Stephenson  Patient type: Established  MRN: [id number]  Service setting: Ambulatory outpatient  DOB: November 06, 1975  Location: [facility]  DOS: 07/16/2015  Primary Care Physician: Cathlean Cower, MD  Note by: Kathlen Brunswick. Dossie Arbour, M.D, DABA, DABAPM, DABPM, DABIPP, FIPP  Referring Physician: Milinda Pointer, MD  Specialty: Board-Certified Interventional Pain Management  Last Visit to Pain Management: 07/15/2015   Primary Reason(s) for Visit: Interventional Pain Management Treatment. CC: Back Pain  Primary Diagnosis: Lumbar spondylosis, unspecified spinal osteoarthritis [M47.816]   Procedure:  Anesthesia, Analgesia, Anxiolysis:  Procedure #1: Type: Diagnostic Medial Branch Facet Block Region: Lumbar Level: L2, L3, L4, L5, & S1 Medial Branch Level(s) Laterality: Bilateral  Procedure #2: Type: Diagnostic Sacroiliac Joint Block Region: Posterior Lumbosacral Level: PSIS (Posterior Superior Iliac Spine) Sacroiliac Joint Laterality: Bilateral  Indications: 1. Lumbar spondylosis, unspecified spinal osteoarthritis   2. Chronic low back pain (Location of Primary Source of Pain) (Bilateral) (R>L)   3. Lumbar facet syndrome (Bilateral) (R>L)   4. Chronic sacroiliac joint pain (Right)     Pre-procedure Pain Score: 4/10 Reported level of pain is compatible with clinical observations Post-procedure Pain Score: 0-No pain  Type: Moderate (Conscious) Sedation & Local Anesthesia Local Anesthetic: Lidocaine 1% Route: Intravenous (IV) IV Access: Secured Sedation: Meaningful verbal contact was maintained at all times during the procedure  Indication(s): Analgesia & Anxiolysis   Pre-Procedure Assessment  Ms. Stacy Stephenson is a 40 y.o. year old, female patient, seen today for interventional treatment. She has DEPRESSION; MIGRAINE, COMMON; HYPERTENSION; GASTROENTERITIS, ACUTE; Anxiety state; Adult hypothyroidism; Avitaminosis D; Abnormal weight gain; Chronic pain;  Long term current use of opiate analgesic; Long term prescription opiate use; Opiate use (22.5 MME/Day); Encounter for therapeutic drug level monitoring; Encounter for chronic pain management; Failed back surgical syndrome; Chronic low back pain (Location of Primary Source of Pain) (Bilateral) (R>L); Lumbar spondylosis; Lumbar facet syndrome (Bilateral) (R>L); Chronic sacroiliac joint pain (Right); Neurogenic pain; Neuropathic pain; Muscle spasm; Occipital headache; Bilateral occipital neuralgia; and Excessive and frequent menstruation with irregular cycle on her problem list.. Her primarily concern today is the Back Pain   Pain Type: Chronic pain Pain Location: Back Pain Orientation: Lower Pain Descriptors / Indicators: Sharp Pain Frequency: Constant  Date of Last Visit: 07/15/15 Service Provided on Last Visit: Evaluation  Coagulation Parameters Lab Results  Component Value Date   PLT 243 03/29/2013    Verification of the correct person, correct site (including marking of site), and correct procedure were performed and confirmed by the patient.  Consent: Secured. Under the influence of no sedatives a written informed consent was obtained, after having provided information on the risks and possible complications. To fulfill our ethical and legal obligations, as recommended by the American Medical Association's Code of Ethics, we have provided information to the patient about our clinical impression; the nature and purpose of the treatment or procedure; the risks, benefits, and possible complications of the intervention; alternatives; the risk(s) and benefit(s) of the alternative treatment(s) or procedure(s); and the risk(s) and benefit(s) of doing nothing. The patient was provided information about the risks and possible complications associated with the procedure. These include, but are not limited to, failure to achieve desired goals, infection, bleeding, organ or nerve damage, allergic  reactions, paralysis, and death. In the case of spinal procedures these may include, but are not limited to, failure to achieve desired goals, infection, bleeding, organ or nerve damage, allergic reactions, paralysis, and death. In the case of intra- or periarticular procedures  these may include, but are not limited to, failure to achieve desired goals, infection, bleeding (hemarthrosis), organ or nerve damage, allergic reactions, and death. In addition, the patient was informed that Medicine is not an exact science; therefore, there is also the possibility of unforeseen risks and possible complications that may result in a catastrophic outcome. The patient indicated having understood very clearly. We have given the patient no guarantees and we have made no promises. Enough time was given to the patient to ask questions, all of which were answered to the patient's satisfaction.  Consent Attestation: I, the ordering provider, attest that I have discussed with the patient the benefits, risks, side-effects, alternatives, likelihood of achieving goals, and potential problems during recovery for the procedure that I have provided informed consent.  Pre-Procedure Preparation: Safety Precautions: Allergies reviewed. Appropriate site, procedure, and patient were confirmed by following the Joint Commission's Universal Protocol (UP.01.01.01), in the form of a "Time Out". The patient was asked to confirm marked site and procedure, before commencing. The patient was asked about blood thinners, or active infections, both of which were denied. Patient was assessed for positional comfort and all pressure points were checked before starting procedure. Allergies: She is allergic to fluoxetine hcl; venlafaxine; zolpidem tartrate; and zolpidem tartrate.. Infection Control Precautions: Sterile technique used. Standard Universal Precautions were taken as recommended by the Department of [place] for Disease Control  and Prevention (CDC). Standard pre-surgical skin prep was conducted. Respiratory hygiene and cough etiquette was practiced. Hand hygiene observed. Safe injection practices and needle disposal techniques followed. SDV (single dose vial) medications used. Medications properly checked for expiration dates and contaminants. Personal protective equipment (PPE) used: Sterile Radiation-resistant gloves. Monitoring:  As per clinic protocol. Filed Vitals:   07/16/15 1426 07/16/15 1431 07/16/15 1434 07/16/15 1444  BP: 137/89 130/85 145/96 169/87  Pulse: 73 72 71 74  Temp:    97.5 F (36.4 C)  TempSrc:    Temporal  Resp: _0 Height:      Weight:      SpO2: 97% 98% 99% 97%  Calculated BMI: Body mass index is 27.33 kg/(m^2).  Description of Procedure #1 Process:   Time-out: "Time-out" completed before starting procedure, as per protocol. Position: Prone Target Area: For Lumbar Facet blocks, the target is the groove formed by the junction of the transverse process and superior articular process. For the L5 dorsal ramus, the target is the notch between superior articular process and sacral ala. For the S1 dorsal ramus, the target is the superior and lateral edge of the posterior S1 Sacral foramen. Approach: Paramedial approach. Area Prepped: Entire Posterior Lumbosacral Region Prepping solution: ChloraPrep (2% chlorhexidine gluconate and 70% isopropyl alcohol) Safety Precautions: Aspiration looking for blood return was conducted prior to all injections. At no point did we inject any substances, as a needle was being advanced. No attempts were made at seeking any paresthesias. Safe injection practices and needle disposal techniques used. Medications properly checked for expiration dates. SDV (single dose vial) medications used.     Description of the Procedure: Protocol guidelines were followed. The patient was placed in position over the fluoroscopy table. The target area was identified and the area  prepped in the usual manner. Skin desensitized using vapocoolant spray. Skin & deeper tissues infiltrated with local anesthetic. Appropriate amount of time allowed to pass for local anesthetics to take effect. The procedure needle was introduced through the skin, ipsilateral to the reported pain, and advanced to the target  area. Employing the "Medial Branch Technique", the needles were advanced to the angle made by the superior and medial portion of the transverse process, and the lateral and inferior portion of the superior articulating process of the targeted vertebral bodies. This area is known as "Burton's Eye" or the "Eye of the [place] Dog". A procedure needle was introduced through the skin, and this time advanced to the angle made by the superior and medial border of the sacral ala, and the lateral border of the S1 vertebral body. This last needle was later repositioned at the superior and lateral border of the posterior S1 foramen. Negative aspiration confirmed. Solution injected in intermittent fashion, asking for systemic symptoms every 0.5cc of injectate. The needles were then removed and the area cleansed, making sure to leave some of the prepping solution back to take advantage of its long term bactericidal properties. Materials & Medications Used:  Needle(s) Used: 22g - 3.5" Spinal Needle(s)  Description of Procedure # 2 Process:   Time-out: "Time-out" completed before starting procedure, as per protocol. Position: Prone Target Area: For upper sacroiliac joint block(s), the target is the superior and posterior margin of the sacroiliac joint. Approach: Ipsilateral approach. Area Prepped: Entire Posterior Lumbosacral Region Prepping solution: Duraprep (Iodine Povacrylex [0.7% available Iodine] and Isopropyl Alcohol, 74% w/w) Safety Precautions: Aspiration looking for blood return was conducted prior to all injections. At no point did we inject any substances, as a needle was being advanced.  No attempts were made at seeking any paresthesias. Safe injection practices and needle disposal techniques used. Medications properly checked for expiration dates. SDV (single dose vial) medications used. Description of the Procedure: Protocol guidelines were followed. The patient was placed in position over the fluoroscopy table. The target area was identified and the area prepped in the usual manner. Skin desensitized using vapocoolant spray. Skin & deeper tissues infiltrated with local anesthetic. Appropriate amount of time allowed to pass for local anesthetics to take effect. The procedure needle was advanced under fluoroscopic guidance into the sacroiliac joint until a firm endpoint was obtained. Proper needle placement secured. Negative aspiration confirmed. Solution injected in intermittent fashion, asking for systemic symptoms every 0.5cc of injectate. The needles were then removed and the area cleansed, making sure to leave some of the prepping solution back to take advantage of its long term bactericidal properties. Materials & Medications Used:  Needle(s) Used: 22g - 3.5" Spinal Needle(s)  EBL: None Medications Administered today: We administered triamcinolone acetonide, lidocaine (PF), ropivacaine (PF) 2 mg/ml (0.2%), methylPREDNISolone acetate, lidocaine (PF), ropivacaine (PF) 2 mg/ml (0.2%), and triamcinolone acetonide.Please see chart orders for dosing details.  Imaging Guidance:   Type of Imaging Technique: Fluoroscopy Guidance (Spinal) Indication(s): Assistance in needle guidance and placement for procedures requiring needle placement in or near specific anatomical locations not easily accessible without such assistance. Exposure Time: Please see nurses notes. Contrast: None required. Fluoroscopic Guidance: I was personally present in the fluoroscopy suite, where the patient was placed in position for the procedure, over the fluoroscopy-compatible table. Fluoroscopy was manipulated,  using "Tunnel Vision Technique", to obtain the best possible view of the target area, on the affected side. Parallax error was corrected before commencing the procedure. A "direction-depth-direction" technique was used to introduce the needle under continuous pulsed fluoroscopic guidance. Once the target was reached, antero-posterior, oblique, and lateral fluoroscopic projection views were taken to confirm needle placement in all planes. Permanently recorded images stored by scanning into EMR. Interpretation: Intraoperative imaging interpretation by performing Physician. Adequate needle  placement confirmed. Adequate needle placement confirmed in AP, lateral, & Oblique Views. No contrast injected.  Antibiotic Prophylaxis:  Indication(s): No indications identified. Type:  Antibiotics Given (last 72 hours)    None       Post-operative Assessment:   Complications: No immediate post-treatment complications were observed. Disposition: Return to clinic for follow-up evaluation. The patient tolerated the entire procedure well. A repeat set of vitals were taken after the procedure and the patient was kept under observation following institutional policy, for this procedure. Post-procedural neurological assessment was performed, showing return to baseline, prior to discharge. The patient was discharged home, once institutional criteria were met. The patient was provided with post-procedure discharge instructions, including a section on how to identify potential problems. Should any problems arise concerning this procedure, the patient was given instructions to immediately contact us, at any time, without hesitation. In any case, we plan to contact the patient by telephone for a follow-up status report regarding this interventional procedure. Comments:  No additional relevant information.  Medications administered during this visit: We administered triamcinolone acetonide, lidocaine (PF), ropivacaine (PF) 2  mg/ml (0.2%), methylPREDNISolone acetate, lidocaine (PF), ropivacaine (PF) 2 mg/ml (0.2%), and triamcinolone acetonide.  Prescriptions ordered during this visit: New Prescriptions   No medications on file    Future Appointments Date Time Provider [place]  08/07/2015 9:00 AM Milinda Pointer, MD [facility] None  08/08/2015 10:40 AM Milinda Pointer, MD [facility] None  08/09/2015 11:00 AM [facility]    Primary Care Physician: Cathlean Cower, MD Location: [facility] Note by: Kathlen Brunswick. Dossie Arbour, M.D, DABA, DABAPM, DABPM, DABIPP, FIPP  Disclaimer:  Medicine is not an exact science. The only guarantee in medicine is that nothing is guaranteed. It is important to note that the decision to proceed with this intervention was based on the information collected from the patient. The Data and conclusions were drawn from the patient's questionnaire, the interview, and the physical examination. Because the information was provided in large part by the patient, it cannot be guaranteed that it has not been purposely or unconsciously manipulated. Every effort has been made to obtain as much relevant data as possible for this evaluation. It is important to note that the conclusions that lead to this procedure are derived in large part from the available data. Always take into account that the treatment will also be dependent on availability of resources and existing treatment guidelines, considered by other Pain Management Practitioners as being common knowledge and practice, at the time of the intervention. For Medico-Legal purposes, it is also important to point out that variation in procedural techniques and pharmacological choices are the acceptable norm. The indications, contraindications, technique, and results of the above procedure should only be interpreted and judged by a Board-Certified Interventional Pain Specialist with extensive familiarity and expertise  in the same exact procedure and technique. Attempts at providing opinions without similar or greater experience and expertise than that of the treating physician will be considered as inappropriate and unethical, and shall result in a formal complaint to the state medical board and applicable specialty societies.

## 2015-07-17 ENCOUNTER — Telehealth: Payer: Self-pay | Admitting: *Deleted

## 2015-07-17 NOTE — Telephone Encounter (Signed)
Left voicemail for post procedure call. 

## 2015-07-18 LAB — 25-HYDROXY VITAMIN D LCMS D2+D3
25-Hydroxy, Vitamin D-2: 42 ng/mL
25-Hydroxy, Vitamin D-3: 31 ng/mL
25-Hydroxy, Vitamin D: 73 ng/mL

## 2015-07-22 ENCOUNTER — Telehealth: Payer: Self-pay | Admitting: *Deleted

## 2015-07-22 NOTE — Progress Notes (Signed)
Quick Note:   Normal Vitamin B-12 levels are between 211 and 946 pg/mL, for our Lab. Medical conditions that can increase levels of vitamin B12 include liver disease, kidney failure and myeloproliferative disorders, which includes myelocytic leukemia and polycythemia vera.  ______ 

## 2015-07-22 NOTE — Telephone Encounter (Signed)
Neoma Laming from [facility] the number is [phone] lvm stating that tthey're wanting on the patient FMLA to be completed by the doctor. The patients claim number is [id number] the fax number is [phone].thanks

## 2015-07-22 NOTE — Progress Notes (Signed)
Quick Note:   Potassium levels below 3.6 mmol/L are considered to be low. Levels (less than 2.5 mmol/L) can be life-threatening and requires urgent medical attention. Low potassium (hypokalemia) has many causes. The most common cause is excessive potassium loss in urine due to prescription water or fluid pills (diuretics). Vomiting or diarrhea or both can result in excessive potassium loss from the digestive tract. Causes of potassium loss leading to low potassium include: chronic kidney disease; diabetic ketoacidosis; diarrhea; excessive alcohol use; excessive laxative use; excessive sweating; folic acid deficiency; diuretics; primary aldosteronism; vomiting; and/or some antibiotic use.  Normal fasting (NPO x 8 hours) glucose levels are between 65-99 mg/dl, with 2 hour fasting, levels are usually less than 140 mg/dl. Any random blood glucose level greater than 200 mg/dl is considered to be Diabetes.  While most low ALT level results indicate a normal healthy liver, that may not always be the case. A low-functioning or non-functioning liver, lacking normal levels of ALT activity to begin with, would not release a lot of ALT into the blood when damaged. People infected with the hepatitis C virus initially show high ALT levels in their blood, but these levels fall over time. Because the ALT test measures ALT levels at only one point in time, people with chronic hepatitis C infection may already have experienced the ALT peak well before blood was drawn for the ALT test. Urinary tract infections or malnutrition may also cause low blood ALT levels. ______ 

## 2015-07-25 LAB — TOXASSURE SELECT 13 (MW), URINE: PDF: 0

## 2015-07-25 NOTE — Progress Notes (Signed)
Quick Note:  NOTE: This forensic urine drug screen (UDS) test was conducted using a state-of-the-art ultra high performance liquid chromatography and mass spectrometry system (UPLC/MS-MS), the most sophisticated and accurate method available. UPLC/MS-MS is 1,000 times more precise and accurate than standard gas chromatography and mass spectrometry (GC/MS). This system can analyze 26 drug categories and 180 drug compounds.  The findings of this UDT were reported as abnormal due to inconsistencies with expected results. An expected substance was not present in the sample. Expectations were based on the medication history provided by the patient at the time of sample collection. Results may suggest one of the following possibilities:  1). Possible opioid diversion. 2). Non-compliance with instructions on how to take the medication, including increase intake of medication early in the prescription refill, possibly running out towards the end of the prescribed period. 3). A scheduling issue where the patient was unable to come in before running out of medication. 4). PRN and/or low dose use of medication. ______ 

## 2015-08-07 ENCOUNTER — Encounter: Payer: Managed Care, Other (non HMO) | Admitting: Pain Medicine

## 2015-08-08 ENCOUNTER — Encounter: Payer: Self-pay | Admitting: Pain Medicine

## 2015-08-08 ENCOUNTER — Ambulatory Visit: Payer: Managed Care, Other (non HMO) | Attending: Pain Medicine | Admitting: Pain Medicine

## 2015-08-08 VITALS — BP 145/92 | HR 71 | Temp 99.0°F | Resp 15 | Ht 61.0 in | Wt 147.0 lb

## 2015-08-08 DIAGNOSIS — E039 Hypothyroidism, unspecified: Secondary | ICD-10-CM | POA: Diagnosis not present

## 2015-08-08 DIAGNOSIS — I10 Essential (primary) hypertension: Secondary | ICD-10-CM | POA: Diagnosis not present

## 2015-08-08 DIAGNOSIS — F1190 Opioid use, unspecified, uncomplicated: Secondary | ICD-10-CM

## 2015-08-08 DIAGNOSIS — N921 Excessive and frequent menstruation with irregular cycle: Secondary | ICD-10-CM | POA: Insufficient documentation

## 2015-08-08 DIAGNOSIS — F329 Major depressive disorder, single episode, unspecified: Secondary | ICD-10-CM | POA: Diagnosis not present

## 2015-08-08 DIAGNOSIS — E559 Vitamin D deficiency, unspecified: Secondary | ICD-10-CM | POA: Diagnosis not present

## 2015-08-08 DIAGNOSIS — M533 Sacrococcygeal disorders, not elsewhere classified: Secondary | ICD-10-CM

## 2015-08-08 DIAGNOSIS — G43909 Migraine, unspecified, not intractable, without status migrainosus: Secondary | ICD-10-CM | POA: Insufficient documentation

## 2015-08-08 DIAGNOSIS — M62838 Other muscle spasm: Secondary | ICD-10-CM

## 2015-08-08 DIAGNOSIS — M545 Low back pain: Secondary | ICD-10-CM | POA: Insufficient documentation

## 2015-08-08 DIAGNOSIS — M5481 Occipital neuralgia: Secondary | ICD-10-CM | POA: Insufficient documentation

## 2015-08-08 DIAGNOSIS — M47816 Spondylosis without myelopathy or radiculopathy, lumbar region: Secondary | ICD-10-CM | POA: Insufficient documentation

## 2015-08-08 DIAGNOSIS — G8929 Other chronic pain: Secondary | ICD-10-CM | POA: Diagnosis not present

## 2015-08-08 DIAGNOSIS — R635 Abnormal weight gain: Secondary | ICD-10-CM | POA: Diagnosis not present

## 2015-08-08 DIAGNOSIS — Z79891 Long term (current) use of opiate analgesic: Secondary | ICD-10-CM | POA: Diagnosis not present

## 2015-08-08 DIAGNOSIS — F419 Anxiety disorder, unspecified: Secondary | ICD-10-CM | POA: Insufficient documentation

## 2015-08-08 DIAGNOSIS — F40240 Claustrophobia: Secondary | ICD-10-CM | POA: Insufficient documentation

## 2015-08-08 DIAGNOSIS — M792 Neuralgia and neuritis, unspecified: Secondary | ICD-10-CM

## 2015-08-08 MED ORDER — OXYCODONE HCL 5 MG PO TABS: 5.0000 mg | ORAL_TABLET | Freq: Three times a day (TID) | ORAL | Status: DC | PRN

## 2015-08-08 MED ORDER — LIDOCAINE HCL (PF) 1 % IJ SOLN
10.0000 mL | Freq: Once | INTRAMUSCULAR | Status: AC
  Administered 2015-08-08: 10 mL
  Filled 2015-08-08: qty 10

## 2015-08-08 MED ORDER — FENTANYL CITRATE (PF) 100 MCG/2ML IJ SOLN: 25.0000 ug | INTRAMUSCULAR | Status: DC | PRN

## 2015-08-08 MED ORDER — METHOCARBAMOL 500 MG PO TABS: 500.0000 mg | ORAL_TABLET | Freq: Two times a day (BID) | ORAL | Status: DC | PRN

## 2015-08-08 MED ORDER — TRIAMCINOLONE ACETONIDE 40 MG/ML IJ SUSP: 40.0000 mg | Freq: Once | INTRAMUSCULAR | Status: DC

## 2015-08-08 MED ORDER — GABAPENTIN 100 MG PO CAPS: 100.0000 mg | ORAL_CAPSULE | Freq: Three times a day (TID) | ORAL | Status: DC

## 2015-08-08 MED ORDER — METHYLPREDNISOLONE ACETATE 80 MG/ML IJ SUSP
INTRAMUSCULAR | Status: AC
  Filled 2015-08-08: qty 1

## 2015-08-08 MED ORDER — CYCLOBENZAPRINE HCL 10 MG PO TABS: 10.0000 mg | ORAL_TABLET | Freq: Every day | ORAL | Status: DC

## 2015-08-08 MED ORDER — MIDAZOLAM HCL 5 MG/5ML IJ SOLN
INTRAMUSCULAR | Status: AC
  Administered 2015-08-08: 5 mg via INTRAVENOUS
  Filled 2015-08-08: qty 5

## 2015-08-08 MED ORDER — ROPIVACAINE HCL 2 MG/ML IJ SOLN: 9.0000 mL | Freq: Once | INTRAMUSCULAR | Status: DC

## 2015-08-08 MED ORDER — GABAPENTIN 800 MG PO TABS: 800.0000 mg | ORAL_TABLET | Freq: Every day | ORAL | Status: DC

## 2015-08-08 MED ORDER — MAGNESIUM OXIDE -MG SUPPLEMENT 500 MG PO CAPS: 1.0000 | ORAL_CAPSULE | Freq: Two times a day (BID) | ORAL | Status: DC

## 2015-08-08 MED ORDER — DIAZEPAM 5 MG PO TABS: ORAL_TABLET | ORAL | Status: DC

## 2015-08-08 MED ORDER — OXYCODONE HCL 5 MG PO TABS: 5.0000 mg | ORAL_TABLET | ORAL | Status: DC | PRN

## 2015-08-08 MED ORDER — TRIAMCINOLONE ACETONIDE 40 MG/ML IJ SUSP
INTRAMUSCULAR | Status: DC
Start: 2015-08-08 — End: 2015-08-08
  Administered 2015-08-08: 12:00:00
  Filled 2015-08-08: qty 1

## 2015-08-08 MED ORDER — LACTATED RINGERS IV SOLN: 1000.0000 mL | Freq: Once | INTRAVENOUS | Status: DC

## 2015-08-08 MED ORDER — ROPIVACAINE HCL 2 MG/ML IJ SOLN
INTRAMUSCULAR | Status: AC
  Administered 2015-08-08: 12:00:00
  Filled 2015-08-08: qty 10

## 2015-08-08 MED ORDER — LIDOCAINE HCL (PF) 1 % IJ SOLN
10.0000 mL | Freq: Once | INTRAMUSCULAR | Status: AC
  Administered 2015-08-08: 10 mL

## 2015-08-08 MED ORDER — MIDAZOLAM HCL 5 MG/5ML IJ SOLN: 1.0000 mg | INTRAMUSCULAR | Status: DC | PRN

## 2015-08-08 MED ORDER — FENTANYL CITRATE (PF) 100 MCG/2ML IJ SOLN
INTRAMUSCULAR | Status: AC
  Administered 2015-08-08: 100 ug via INTRAVENOUS
  Filled 2015-08-08: qty 2

## 2015-08-08 MED ORDER — ROPIVACAINE HCL 2 MG/ML IJ SOLN
INTRAMUSCULAR | Status: DC
Start: 2015-08-08 — End: 2015-08-08
  Administered 2015-08-08: 12:00:00
  Filled 2015-08-08: qty 10

## 2015-08-08 NOTE — Progress Notes (Signed)
Safety precautions to be maintained throughout the outpatient stay will include: orient to surroundings, keep bed in low position, maintain call bell within reach at all times, provide assistance with transfer out of bed and ambulation.  Did not bring meds today- states she is out and needs a refill

## 2015-08-08 NOTE — Progress Notes (Signed)
Patient's Name: Stacy Stephenson  Patient type: Established  MRN: [id number]  Service setting: Ambulatory outpatient  DOB: [birth year]  Location: [facility]  DOS: 08/08/2015  Primary Care Physician: Tracie Harrier, MD  Note by: Kathlen Brunswick. Dossie Arbour, M.D, DABA, DABAPM, DABPM, Milagros Evener, FIPP  Referring Physician: Milinda Pointer, MD  Specialty: Board-Certified Interventional Pain Management  Last Visit to Pain Management: 07/22/2015   Primary Reason(s) for Visit: Interventional Pain Management Treatment. CC: No chief complaint on file.  Primary Diagnosis: Facet syndrome, lumbar [M54.5]   Procedure #1:  Anesthesia, Analgesia, Anxiolysis:  Type: Therapeutic Medial Branch Facet Radiofrequency Ablation Region: Lumbar Level: L2, L3, L4, L5, & S1 Medial Branch Level(s) Laterality: Right-Sided  Indications: 1. Lumbar facet syndrome (Bilateral) (R>L)    Pre-procedure Pain Score: 5/10 Reported level of pain is compatible with clinical observations Post-procedure Pain Score: 5   The patient has failed to respond to conservative therapies including over-the-counter medications, anti-inflammatories, muscle relaxants, membrane stabilizers, opioids, physical therapy, modalities such as heat and ice, as well as more invasive techniques such as nerve blocks. The patient did attained more than 50% relief of the pain from a series of diagnostic injections conducted in separate occasions.  Type: Moderate (Conscious) Sedation & Local Anesthesia Local Anesthetic: Lidocaine 1% Route: Intravenous (IV) IV Access: Secured Sedation: Meaningful verbal contact was maintained at all times during the procedure  Indication(s): Analgesia & Anxiolysis   Procedure #2:  Anesthesia, Analgesia, Anxiolysis:  Type: Therapeutic Sacroiliac Joint Radiofrequency Ablation (medial branch of L4, L5, S1, S2, and S3) Region: Lumbosacral Region Level: PSIS (Posterior Superior Iliac Spine) Laterality:  Right-Sided  Indications: 1. Chronic sacroiliac joint pain (Right)     Type: Moderate (Conscious) Sedation & Local Anesthesia Local Anesthetic: Lidocaine 1% Route: Intravenous (IV) IV Access: Secured Sedation: Meaningful verbal contact was maintained at all times during the procedure  Indication(s): Analgesia & Anxiolysis   Pre-Procedure Assessment:  Stacy Stephenson is a 40 y.o. year old, female patient, seen today for interventional treatment. She has DEPRESSION; MIGRAINE, COMMON; HYPERTENSION; GASTROENTERITIS, ACUTE; Anxiety state; Adult hypothyroidism; Avitaminosis D; Abnormal weight gain; Chronic pain; Long term current use of opiate analgesic; Long term prescription opiate use; Opiate use (22.5 MME/Day); Encounter for therapeutic drug level monitoring; Encounter for chronic pain management; Failed back surgical syndrome; Chronic low back pain (Location of Primary Source of Pain) (Bilateral) (R>L); Lumbar spondylosis; Lumbar facet syndrome (Bilateral) (R>L); Chronic sacroiliac joint pain (Right); Neurogenic pain; Neuropathic pain; Muscle spasm; Occipital headache; Bilateral occipital neuralgia; Excessive and frequent menstruation with irregular cycle; and Claustrophobia on her problem list.. Her primarily concern today is the No chief complaint on file.   Pain Type: Chronic pain Pain Location: Back Pain Orientation: Lower Pain Descriptors / Indicators: Cramping, Aching, Squeezing, Jabbing, Sharp (nerve pain) Pain Frequency: Constant  Date of Last Visit: 07/16/15 Service Provided on Last Visit: Procedure  Coagulation Parameters Lab Results  Component Value Date   PLT 243 03/29/2013    Verification of the correct person, correct site (including marking of site), and correct procedure were performed and confirmed by the patient.  Consent: Secured. Under the influence of no sedatives a written informed consent was obtained, after having provided information on the risks and possible  complications. To fulfill our ethical and legal obligations, as recommended by the American Medical Association's Code of Ethics, we have provided information to the patient about our clinical impression; the nature and purpose of the treatment or procedure; the risks, benefits, and possible complications of the  intervention; alternatives; the risk(s) and benefit(s) of the alternative treatment(s) or procedure(s); and the risk(s) and benefit(s) of doing nothing. The patient was provided information about the risks and possible complications associated with the procedure. These include, but are not limited to, failure to achieve desired goals, infection, bleeding, organ or nerve damage, allergic reactions, paralysis, and death. In the case of spinal procedures these may include, but are not limited to, failure to achieve desired goals, infection, bleeding, organ or nerve damage, allergic reactions, paralysis, and death. In addition, the patient was informed that Medicine is not an exact science; therefore, there is also the possibility of unforeseen risks and possible complications that may result in a catastrophic outcome. The patient indicated having understood very clearly. We have given the patient no guarantees and we have made no promises. Enough time was given to the patient to ask questions, all of which were answered to the patient's satisfaction.  Consent Attestation: I, the ordering provider, attest that I have discussed with the patient the benefits, risks, side-effects, alternatives, likelihood of achieving goals, and potential problems during recovery for the procedure that I have provided informed consent.  Pre-Procedure Preparation: Safety Precautions: Allergies reviewed. Appropriate site, procedure, and patient were confirmed by following the Joint Commission's Universal Protocol (UP.01.01.01), in the form of a "Time Out". The patient was asked to confirm marked site and procedure, before  commencing. The patient was asked about blood thinners, or active infections, both of which were denied. Patient was assessed for positional comfort and all pressure points were checked before starting procedure. Allergies: She is allergic to fluoxetine hcl; venlafaxine; zolpidem tartrate; and zolpidem tartrate.. Infection Control Precautions: Aseptic technique used. Standard Universal Precautions were taken as recommended by the Department of [facility] for Disease Control and Prevention (CDC). Standard pre-surgical skin prep was conducted. Respiratory hygiene and cough etiquette was practiced. Hand hygiene observed. Safe injection practices and needle disposal techniques followed. SDV (single dose vial) medications used. Medications properly checked for expiration dates and contaminants. Personal protective equipment (PPE) used: Sterile Radiation-resistant gloves. Monitoring:  As per clinic protocol. Filed Vitals:   08/08/15 1312 08/08/15 1322 08/08/15 1329 08/08/15 1336  BP: 153/95 156/103 136/90 145/92  Pulse: 76 78 72 71  Temp:      TempSrc:      Resp: '13 14 16 15  '$ Height:      Weight:      SpO2: 99% 97% 98% 99%  Calculated BMI: Body mass index is 27.79 kg/(m^2).  Description of Procedure #1 Process:   Time-out: "Time-out" completed before starting procedure, as per protocol. Position: Prone Target Area: Medial branches of L2, L3, L4, L5 Approach: Paraspinal approach. Area Prepped: Entire Posterior Lumbosacral Region Prepping solution: ChloraPrep (2% chlorhexidine gluconate and 70% isopropyl alcohol) Safety Precautions: Aspiration looking for blood return was conducted prior to all injections. At no point did we inject any substances, as a needle was being advanced. No attempts were made at seeking any paresthesias. Safe injection practices and needle disposal techniques used. Medications properly checked for expiration dates. SDV (single dose vial) medications used.    Description of the Procedure: Protocol guidelines were followed. The patient was placed in position over the fluoroscopy table. The target area was identified and the area prepped in the usual manner. Skin desensitized using vapocoolant spray. Skin & deeper tissues infiltrated with local anesthetic. Appropriate amount of time allowed to pass for local anesthetics to take effect. Radiofrequency needles were introduced to the area of the  medial branch at the junction of the superior articular process and transverse process using fluoroscopy. Using the [employer], sensory stimulation using 50 Hz was used to locate & identify the nerve, making sure that the needle was positioned such that there was no sensory stimulation below 0.3 V or above 0.7 V. Stimulation using 2 Hz was used to evaluate the motor component. Care was taken not to lesion any nerves that demonstrated motor stimulation of the lower extremities at an output of less than 2.5 times that of the sensory threshold, or a maximum of 2.0 V. Once satisfactory placement of the needles was achieved, the above solution was slowly injected after negative aspiration. After waiting for at least 2 minutes, the ablation was performed at 80 degrees C for 60 seconds.The needles were then removed and the area cleansed, making sure to leave some of the prepping solution back to take advantage of its long term bactericidal properties. EBL: None Materials & Medications Used:  Needle(s) Used: Teflon-Coated Radiofrequency Needles  Description of Procedure #2 Process:   Time-out: "Time-out" completed before starting procedure, as per protocol. Position: Prone Target Area: Medial branches of S1, S2, S3 Approach: Posterior, paraspinal, ipsilateral approach. Area Prepped: Entire Lower Lumbosacral Region Prepping solution: ChloraPrep (2% chlorhexidine gluconate and 70% isopropyl alcohol) Safety Precautions: Aspiration looking for blood return was  conducted prior to all injections. At no point did we inject any substances, as a needle was being advanced. No attempts were made at seeking any paresthesias. Safe injection practices and needle disposal techniques used. Medications properly checked for expiration dates. SDV (single dose vial) medications used.   Description of the Procedure: Protocol guidelines were followed. The patient was placed in position over the procedure table. The target area was identified and the area prepped in the usual manner. Skin & deeper tissues infiltrated with local anesthetic. Appropriate amount of time allowed to pass for local anesthetics to take effect. The Radiofrequency needles were introduced to the area of the medial branch at the junction of the superior articular process and transverse process using fluoroscopy. Sensory stimulation using 50 Hz was used to locate & identify the nerve, making sure that the needle was positioned such that there was no sensory stimulation below 0.3 V or above 0.7 V. Stimulation using 2 Hz was used to evaluate the motor component. Care was taken not to lesion any nerves that demonstrated motor stimulation of the lower extremities at an output of less than 2.5 times that of the sensory threshold, or a maximum of 2.0 V. Once satisfactory placement of the needles was achieved, the above solution was slowly injected after negative aspiration. After waiting for at least 2 minutes, the ablation was performed at 80 degrees C for 60 seconds. The needles were then removed and the area cleansed, making sure to leave some of the prepping solution back to take advantage of its long term bactericidal properties. EBL: None Materials & Medications Used:  Needle(s) Used: 22g - 10cm, Teflon-coated, Radiofrequency needle(s) Solution Injected: 0.2% PF-Ropivacaine (25m) + SDV-Triamcinolone '40mg'$ /ml (16m Medications Administered today: We administered ropivacaine (PF) 2 mg/ml (0.2%), ropivacaine (PF) 2  mg/ml (0.2%), and ropivacaine (PF) 2 mg/ml (0.2%).Please see chart orders for dosing details.  Imaging Guidance:   Type of Imaging Technique: Fluoroscopy Guidance (Spinal) Indication(s): Assistance in needle guidance and placement for procedures requiring needle placement in or near specific anatomical locations not easily accessible without such assistance. Exposure Time: Please see nurses notes. Contrast: None required. Fluoroscopic Guidance: I  was personally present in the fluoroscopy suite, where the patient was placed in position for the procedure, over the fluoroscopy-compatible table. Fluoroscopy was manipulated, using "Tunnel Vision Technique", to obtain the best possible view of the target area, on the affected side. Parallax error was corrected before commencing the procedure. A "direction-depth-direction" technique was used to introduce the needle under continuous pulsed fluoroscopic guidance. Once the target was reached, antero-posterior, oblique, and lateral fluoroscopic projection views were taken to confirm needle placement in all planes. Permanently recorded images stored by scanning into EMR. Interpretation: Intraoperative imaging interpretation by performing Physician. Adequate needle placement confirmed.  Antibiotic Prophylaxis:  Indication(s): No indications identified. Type:  Antibiotics Given (last 72 hours)    None       Post-operative Assessment:   Complications: No immediate post-treatment complications were observed Disposition: Return to clinic for follow-up evaluation. The patient tolerated the entire procedure well. A repeat set of vitals were taken after the procedure and the patient was kept under observation following institutional policy, for this procedure. Post-procedural neurological assessment was performed, showing return to baseline, prior to discharge. The patient was discharged home, once institutional criteria were met. The patient was provided with  post-procedure discharge instructions, including a section on how to identify potential problems. Should any problems arise concerning this procedure, the patient was given instructions to immediately contact us, at any time, without hesitation. In any case, we plan to contact the patient by telephone for a follow-up status report regarding this interventional procedure. Comments:  No additional relevant information  Medications administered during this visit: We administered ropivacaine (PF) 2 mg/ml (0.2%), fentaNYL, triamcinolone acetonide, midazolam, ropivacaine (PF) 2 mg/ml (0.2%), ropivacaine (PF) 2 mg/ml (0.2%), triamcinolone acetonide, lidocaine (PF), and lidocaine (PF).  Prescriptions ordered during this visit: New Prescriptions   DIAZEPAM (VALIUM) 5 MG TABLET    Take 1 tablet 45 minutes prior to MRI and 1 tablet before going in to the scanner.   MAGNESIUM OXIDE 500 MG CAPS    Take 1 capsule (500 mg total) by mouth 2 (two) times daily at 8 am and 10 pm.    Requested PM Follow-up: Return for Post-RF Eval (6-wk).  Future Appointments Date Time Provider [place]  09/18/2015 9:40 AM Milinda Pointer, MD [facility] None    Primary Care Physician: Tracie Harrier, MD Location: [facility] Note by: Kathlen Brunswick. Dossie Arbour, M.D, DABA, DABAPM, DABPM, DABIPP, FIPP Disclaimer:  Medicine is not an exact science. The only guarantee in medicine is that nothing is guaranteed. It is important to note that the decision to proceed with this intervention was based on the information collected from the patient. The Data and conclusions were drawn from the patient's questionnaire, the interview, and the physical examination. Because the information was provided in large part by the patient, it cannot be guaranteed that it has not been purposely or unconsciously manipulated. Every effort has been made to obtain as much relevant data as possible for this evaluation. It is  important to note that the conclusions that lead to this procedure are derived in large part from the available data. Always take into account that the treatment will also be dependent on availability of resources and existing treatment guidelines, considered by other Pain Management Practitioners as being common knowledge and practice, at the time of the intervention. For Medico-Legal purposes, it is also important to point out that variation in procedural techniques and pharmacological choices are the acceptable norm. The indications, contraindications, technique, and results of the  above procedure should only be interpreted and judged by a Board-Certified Interventional Pain Specialist with extensive familiarity and expertise in the same exact procedure and technique. Attempts at providing opinions without similar or greater experience and expertise than that of the treating physician will be considered as inappropriate and unethical, and shall result in a formal complaint to the state medical board and applicable specialty societies.

## 2015-08-08 NOTE — Patient Instructions (Signed)
Drink with Gatorade with meals, Drink Tonic water at night Radiofrequency Lesioning Radiofrequency lesioning is a procedure that is performed to relieve pain. The procedure is often used for back, neck, or arm pain. Radiofrequency lesioning involves the use of a machine that creates radio waves to make heat. During the procedure, the heat is applied to the nerve that carries the pain signal. The heat damages the nerve and interferes with the pain signal. Pain relief usually lasts for 6 months to 1 year. LET [facility] CARE PROVIDER KNOW ABOUT:  Any allergies you have.  All medicines you are taking, including vitamins, herbs, eye drops, creams, and over-the-counter medicines.  Previous problems you or members of your family have had with the use of anesthetics.  Any blood disorders you have.  Previous surgeries you have had.  Any medical conditions you have.  Whether you are pregnant or may be pregnant. RISKS AND COMPLICATIONS Generally, this is a safe procedure. However, problems may occur, including:  Pain or soreness at the injection site.  Infection at the injection site.  Damage to nerves or blood vessels. BEFORE THE PROCEDURE  Ask your health care provider about:  Changing or stopping your regular medicines. This is especially important if you are taking diabetes medicines or blood thinners.  Taking medicines such as aspirin and ibuprofen. These medicines can thin your blood. Do not take these medicines before your procedure if your health care provider instructs you not to.  Follow instructions from your health care provider about eating or drinking restrictions.  Plan to have someone take you home after the procedure.  If you go home right after the procedure, plan to have someone with you for 24 hours. PROCEDURE  You will be given one or more of the following:  A medicine to help you relax (sedative).  A medicine to numb the area (local anesthetic).  You will  be awake during the procedure. You will need to be able to talk with the health care provider during the procedure.  With the help of a type of X-ray (fluoroscopy), the health care provider will insert a radiofrequency needle into the area to be treated.  Next, a wire that carries the radio waves (electrode) will be put through the radiofrequency needle. An electrical pulse will be sent through the electrode to verify the correct nerve. You will feel a tingling sensation, and you may have muscle twitching.  Then, the tissue that is around the needle tip will be heated by an electric current that is passed using the radiofrequency machine. This will numb the nerves.  A bandage (dressing) will be put on the insertion area after the procedure is done. The procedure may vary among health care providers and hospitals. AFTER THE PROCEDURE  Your blood pressure, heart rate, breathing rate, and blood oxygen level will be monitored often until the medicines you were given have worn off.  Return to your normal activities as directed by your health care provider.   This information is not intended to replace advice given to you by your health care provider. Make sure you discuss any questions you have with your health care provider.   Document Released: 09/17/2010 Document Revised: 10/10/2014 Document Reviewed: 02/26/2014 Elsevier Interactive Patient Education 2016 [place]  What are the risk, side effects and possible complications? Generally speaking, most procedures are safe.  However, with any procedure there are risks, side effects, and the possibility of complications.  The risks and complications  are dependent upon the sites that are lesioned, or the type of nerve block to be performed.  The closer the procedure is to the spine, the more serious the risks are.  Great care is taken when placing the radio frequency needles, block needles or lesioning probes, but  sometimes complications can occur.  Infection: Any time there is an injection through the skin, there is a risk of infection.  This is why sterile conditions are used for these blocks.  There are four possible types of infection.  Localized skin infection.  Central Nervous System Infection-This can be in the form of Meningitis, which can be deadly.  Epidural Infections-This can be in the form of an epidural abscess, which can cause pressure inside of the spine, causing compression of the spinal cord with subsequent paralysis. This would require an emergency surgery to decompress, and there are no guarantees that the patient would recover from the paralysis.  Discitis-This is an infection of the intervertebral discs.  It occurs in about 1% of discography procedures.  It is difficult to treat and it may lead to surgery.        2. Pain: the needles have to go through skin and soft tissues, will cause soreness.       3. Damage to internal structures:  The nerves to be lesioned may be near blood vessels or    other nerves which can be potentially damaged.       4. Bleeding: Bleeding is more common if the patient is taking blood thinners such as  aspirin, Coumadin, Ticiid, Plavix, etc., or if he/she have some genetic predisposition  such as hemophilia. Bleeding into the spinal canal can cause compression of the spinal  cord with subsequent paralysis.  This would require an emergency surgery to  decompress and there are no guarantees that the patient would recover from the  paralysis.       5. Pneumothorax:  Puncturing of a lung is a possibility, every time a needle is introduced in  the area of the chest or upper back.  Pneumothorax refers to free air around the  collapsed lung(s), inside of the thoracic cavity (chest cavity).  Another two possible  complications related to a similar event would include: Hemothorax and Chylothorax.   These are variations of the Pneumothorax, where instead of air around the  collapsed  lung(s), you may have blood or chyle, respectively.       6. Spinal headaches: They may occur with any procedures in the area of the spine.       7. Persistent CSF (Cerebro-Spinal Fluid) leakage: This is a rare problem, but may occur  with prolonged intrathecal or epidural catheters either due to the formation of a fistulous  track or a dural tear.       8. Nerve damage: By working so close to the spinal cord, there is always a possibility of  nerve damage, which could be as serious as a permanent spinal cord injury with  paralysis.       9. Death:  Although rare, severe deadly allergic reactions known as "Anaphylactic  reaction" can occur to any of the medications used.      10. Worsening of the symptoms:  We can always make thing worse.  What are the chances of something like this happening? Chances of any of this occuring are extremely low.  By statistics, you have more of a chance of getting killed in a motor vehicle accident: while driving to  the hospital than any of the above occurring .  Nevertheless, you should be aware that they are possibilities.  In general, it is similar to taking a shower.  Everybody knows that you can slip, hit your head and get killed.  Does that mean that you should not shower again?  Nevertheless always keep in mind that statistics do not mean anything if you happen to be on the wrong side of them.  Even if a procedure has a 1 (one) in a 1,000,000 (million) chance of going wrong, it you happen to be that one..Also, keep in mind that by statistics, you have more of a chance of having something go wrong when taking medications.  Who should not have this procedure? If you are on a blood thinning medication (e.g. Coumadin, Plavix, see list of "Blood Thinners"), or if you have an active infection going on, you should not have the procedure.  If you are taking any blood thinners, please inform your physician.  How should I prepare for this procedure?  Do not eat  or drink anything at least six hours prior to the procedure.  Bring a driver with you .  It cannot be a taxi.  Come accompanied by an adult that can drive you back, and that is strong enough to help you if your legs get weak or numb from the local anesthetic.  Take all of your medicines the morning of the procedure with just enough water to swallow them.  If you have diabetes, make sure that you are scheduled to have your procedure done first thing in the morning, whenever possible.  If you have diabetes, take only half of your insulin dose and notify our nurse that you have done so as soon as you arrive at the clinic.  If you are diabetic, but only take blood sugar pills (oral hypoglycemic), then do not take them on the morning of your procedure.  You may take them after you have had the procedure.  Do not take aspirin or any aspirin-containing medications, at least eleven (11) days prior to the procedure.  They may prolong bleeding.  Wear loose fitting clothing that may be easy to take off and that you would not mind if it got stained with Betadine or blood.  Do not wear any jewelry or perfume  Remove any nail coloring.  It will interfere with some of our monitoring equipment.  NOTE: Remember that this is not meant to be interpreted as a complete list of all possible complications.  Unforeseen problems may occur.  BLOOD THINNERS The following drugs contain aspirin or other products, which can cause increased bleeding during surgery and should not be taken for 2 weeks prior to and 1 week after surgery.  If you should need take something for relief of minor pain, you may take acetaminophen which is found in Tylenol,m Datril, Anacin-3 and Panadol. It is not blood thinner. The products listed below are.  Do not take any of the products listed below in addition to any listed on your instruction sheet.  A.P.C or A.P.C with Codeine Codeine Phosphate Capsules #3 Ibuprofen Ridaura  ABC compound  Congesprin Imuran rimadil  Advil Cope Indocin Robaxisal  Alka-Seltzer Effervescent Pain Reliever and Antacid Coricidin or Coricidin-D  Indomethacin Rufen  Alka-Seltzer plus Cold Medicine Cosprin Ketoprofen S-A-C Tablets  Anacin Analgesic Tablets or Capsules Coumadin Korlgesic Salflex  Anacin Extra Strength Analgesic tablets or capsules CP-2 Tablets Lanoril Salicylate  Anaprox Cuprimine Capsules Levenox Salocol  Anexsia-D Dalteparin  Magan Salsalate  Anodynos Darvon compound Magnesium Salicylate Sine-off  Ansaid Dasin Capsules Magsal Sodium Salicylate  Anturane Depen Capsules Marnal Soma  APF Arthritis pain formula Dewitt's Pills Measurin Stanback  Argesic Dia-Gesic Meclofenamic Sulfinpyrazone  Arthritis Bayer Timed Release Aspirin Diclofenac Meclomen Sulindac  Arthritis pain formula Anacin Dicumarol Medipren Supac  Analgesic (Safety coated) Arthralgen Diffunasal Mefanamic Suprofen  Arthritis Strength Bufferin Dihydrocodeine Mepro Compound Suprol  Arthropan liquid Dopirydamole Methcarbomol with Aspirin Synalgos  ASA tablets/Enseals Disalcid Micrainin Tagament  Ascriptin Doan's Midol Talwin  Ascriptin A/D Dolene Mobidin Tanderil  Ascriptin Extra Strength Dolobid Moblgesic Ticlid  Ascriptin with Codeine Doloprin or Doloprin with Codeine Momentum Tolectin  Asperbuf Duoprin Mono-gesic Trendar  Aspergum Duradyne Motrin or Motrin IB Triminicin  Aspirin plain, buffered or enteric coated Durasal Myochrisine Trigesic  Aspirin Suppositories Easprin Nalfon Trillsate  Aspirin with Codeine Ecotrin Regular or Extra Strength Naprosyn Uracel  Atromid-S Efficin Naproxen Ursinus  Auranofin Capsules Elmiron Neocylate Vanquish  Axotal Emagrin Norgesic Verin  Azathioprine Empirin or Empirin with Codeine Normiflo Vitamin E  Azolid Emprazil Nuprin Voltaren  Bayer Aspirin plain, buffered or children's or timed BC Tablets or powders Encaprin Orgaran Warfarin Sodium  Buff-a-Comp Enoxaparin Orudis Zorpin   Buff-a-Comp with Codeine Equegesic Os-Cal-Gesic   Buffaprin Excedrin plain, buffered or Extra Strength Oxalid   Bufferin Arthritis Strength Feldene Oxphenbutazone   Bufferin plain or Extra Strength Feldene Capsules Oxycodone with Aspirin   Bufferin with Codeine Fenoprofen Fenoprofen Pabalate or Pabalate-SF   Buffets II Flogesic Panagesic   Buffinol plain or Extra Strength Florinal or Florinal with Codeine Panwarfarin   Buf-Tabs Flurbiprofen Penicillamine   Butalbital Compound Four-way cold tablets Penicillin   Butazolidin Fragmin Pepto-Bismol   Carbenicillin Geminisyn Percodan   Carna Arthritis Reliever Geopen Persantine   Carprofen Gold's salt Persistin   Chloramphenicol Goody's Phenylbutazone   Chloromycetin Haltrain Piroxlcam   Clmetidine heparin Plaquenil   Cllnoril Hyco-pap Ponstel   Clofibrate Hydroxy chloroquine Propoxyphen         Before stopping any of these medications, be sure to consult the physician who ordered them.  Some, such as Coumadin (Warfarin) are ordered to prevent or treat serious conditions such as "deep thrombosis", "pumonary embolisms", and other heart problems.  The amount of time that you may need off of the medication may also vary with the medication and the reason for which you were taking it.  If you are taking any of these medications, please make sure you notify your pain physician before you undergo any procedures.

## 2015-08-09 ENCOUNTER — Ambulatory Visit
Admission: RE | Admit: 2015-08-09 | Discharge: 2015-08-09 | Disposition: A | Discharge status: Home / Self Care | Payer: Managed Care, Other (non HMO) | Source: Ambulatory Visit | Attending: Pain Medicine | Admitting: Pain Medicine

## 2015-08-09 ENCOUNTER — Telehealth: Payer: Self-pay | Admitting: *Deleted

## 2015-08-09 DIAGNOSIS — G8929 Other chronic pain: Secondary | ICD-10-CM | POA: Insufficient documentation

## 2015-08-09 DIAGNOSIS — M961 Postlaminectomy syndrome, not elsewhere classified: Secondary | ICD-10-CM

## 2015-08-09 DIAGNOSIS — Z9889 Other specified postprocedural states: Secondary | ICD-10-CM | POA: Insufficient documentation

## 2015-08-09 DIAGNOSIS — M545 Low back pain: Secondary | ICD-10-CM | POA: Diagnosis present

## 2015-08-09 DIAGNOSIS — M5127 Other intervertebral disc displacement, lumbosacral region: Secondary | ICD-10-CM | POA: Diagnosis not present

## 2015-08-09 DIAGNOSIS — M539 Dorsopathy, unspecified: Secondary | ICD-10-CM | POA: Diagnosis present

## 2015-08-09 MED ORDER — GADOBENATE DIMEGLUMINE 529 MG/ML IV SOLN
15.0000 mL | Freq: Once | INTRAVENOUS | Status: AC | PRN
  Administered 2015-08-09: 13 mL via INTRAVENOUS

## 2015-08-09 NOTE — Telephone Encounter (Signed)
Pt has a couple of question about her procedure that was completed on yesterday. Please give her a call...thanks

## 2015-08-09 NOTE — Telephone Encounter (Addendum)
Spoke with patient re; procedure on yesterday, has some soreness and bruising at the site of procedure.  Patient did use ice on yesterday.  Patient asked about Zofran ODT, did not see on her medication list.  Patient will contact her internal medicine to see if she can get her Zofran since Dr Dossie Arbour is out of office.

## 2015-08-12 ENCOUNTER — Telehealth: Payer: Self-pay

## 2015-08-12 NOTE — Progress Notes (Signed)
Please fix

## 2015-08-12 NOTE — Telephone Encounter (Signed)
Pt wants Kori to call her concerning paper work

## 2015-08-12 NOTE — Telephone Encounter (Signed)
Patient states that she has bruising at injection sites and has noticed that her lower back appears to be bruised as well.  States that she has never done this before.  Instructed patient to go to ED or PCP to get bruising checked out.  Instructed patient to call us for any other questions or concerns.

## 2015-08-21 ENCOUNTER — Other Ambulatory Visit: Payer: Self-pay | Admitting: Pain Medicine

## 2015-08-21 DIAGNOSIS — M961 Postlaminectomy syndrome, not elsewhere classified: Secondary | ICD-10-CM

## 2015-08-22 ENCOUNTER — Other Ambulatory Visit: Payer: Self-pay

## 2015-08-22 DIAGNOSIS — M47816 Spondylosis without myelopathy or radiculopathy, lumbar region: Secondary | ICD-10-CM

## 2015-08-23 ENCOUNTER — Ambulatory Visit: Payer: Managed Care, Other (non HMO) | Attending: Pain Medicine | Admitting: Pain Medicine

## 2015-08-23 ENCOUNTER — Encounter: Payer: Self-pay | Admitting: Pain Medicine

## 2015-08-23 VITALS — BP 154/109 | HR 100 | Temp 98.7°F | Resp 18 | Ht 61.0 in | Wt 150.0 lb

## 2015-08-23 DIAGNOSIS — M533 Sacrococcygeal disorders, not elsewhere classified: Secondary | ICD-10-CM | POA: Insufficient documentation

## 2015-08-23 DIAGNOSIS — M1288 Other specific arthropathies, not elsewhere classified, other specified site: Secondary | ICD-10-CM | POA: Diagnosis not present

## 2015-08-23 DIAGNOSIS — G43909 Migraine, unspecified, not intractable, without status migrainosus: Secondary | ICD-10-CM | POA: Diagnosis not present

## 2015-08-23 DIAGNOSIS — M4807 Spinal stenosis, lumbosacral region: Secondary | ICD-10-CM | POA: Insufficient documentation

## 2015-08-23 DIAGNOSIS — R635 Abnormal weight gain: Secondary | ICD-10-CM | POA: Insufficient documentation

## 2015-08-23 DIAGNOSIS — Z87891 Personal history of nicotine dependence: Secondary | ICD-10-CM | POA: Diagnosis not present

## 2015-08-23 DIAGNOSIS — M25561 Pain in right knee: Secondary | ICD-10-CM | POA: Diagnosis not present

## 2015-08-23 DIAGNOSIS — G8918 Other acute postprocedural pain: Secondary | ICD-10-CM | POA: Diagnosis not present

## 2015-08-23 DIAGNOSIS — G8929 Other chronic pain: Secondary | ICD-10-CM | POA: Insufficient documentation

## 2015-08-23 DIAGNOSIS — F329 Major depressive disorder, single episode, unspecified: Secondary | ICD-10-CM | POA: Diagnosis not present

## 2015-08-23 DIAGNOSIS — M47817 Spondylosis without myelopathy or radiculopathy, lumbosacral region: Secondary | ICD-10-CM | POA: Insufficient documentation

## 2015-08-23 DIAGNOSIS — N920 Excessive and frequent menstruation with regular cycle: Secondary | ICD-10-CM | POA: Insufficient documentation

## 2015-08-23 DIAGNOSIS — M5127 Other intervertebral disc displacement, lumbosacral region: Secondary | ICD-10-CM | POA: Insufficient documentation

## 2015-08-23 DIAGNOSIS — M47896 Other spondylosis, lumbar region: Secondary | ICD-10-CM

## 2015-08-23 DIAGNOSIS — Z9889 Other specified postprocedural states: Secondary | ICD-10-CM | POA: Insufficient documentation

## 2015-08-23 DIAGNOSIS — Z79891 Long term (current) use of opiate analgesic: Secondary | ICD-10-CM | POA: Insufficient documentation

## 2015-08-23 DIAGNOSIS — M545 Low back pain: Secondary | ICD-10-CM | POA: Insufficient documentation

## 2015-08-23 DIAGNOSIS — M5481 Occipital neuralgia: Secondary | ICD-10-CM | POA: Insufficient documentation

## 2015-08-23 DIAGNOSIS — E559 Vitamin D deficiency, unspecified: Secondary | ICD-10-CM | POA: Diagnosis not present

## 2015-08-23 DIAGNOSIS — E039 Hypothyroidism, unspecified: Secondary | ICD-10-CM | POA: Diagnosis not present

## 2015-08-23 DIAGNOSIS — M47818 Spondylosis without myelopathy or radiculopathy, sacral and sacrococcygeal region: Secondary | ICD-10-CM

## 2015-08-23 DIAGNOSIS — F419 Anxiety disorder, unspecified: Secondary | ICD-10-CM | POA: Diagnosis not present

## 2015-08-23 DIAGNOSIS — R937 Abnormal findings on diagnostic imaging of other parts of musculoskeletal system: Secondary | ICD-10-CM | POA: Insufficient documentation

## 2015-08-23 DIAGNOSIS — Z5181 Encounter for therapeutic drug level monitoring: Secondary | ICD-10-CM | POA: Diagnosis not present

## 2015-08-23 DIAGNOSIS — M461 Sacroiliitis, not elsewhere classified: Secondary | ICD-10-CM | POA: Insufficient documentation

## 2015-08-23 DIAGNOSIS — M5126 Other intervertebral disc displacement, lumbar region: Secondary | ICD-10-CM | POA: Diagnosis not present

## 2015-08-23 DIAGNOSIS — I10 Essential (primary) hypertension: Secondary | ICD-10-CM | POA: Insufficient documentation

## 2015-08-23 DIAGNOSIS — R51 Headache: Secondary | ICD-10-CM | POA: Insufficient documentation

## 2015-08-23 DIAGNOSIS — M2428 Disorder of ligament, vertebrae: Secondary | ICD-10-CM

## 2015-08-23 DIAGNOSIS — M4806 Spinal stenosis, lumbar region: Secondary | ICD-10-CM | POA: Diagnosis not present

## 2015-08-23 DIAGNOSIS — M1711 Unilateral primary osteoarthritis, right knee: Secondary | ICD-10-CM | POA: Diagnosis not present

## 2015-08-23 DIAGNOSIS — M549 Dorsalgia, unspecified: Secondary | ICD-10-CM | POA: Diagnosis present

## 2015-08-23 DIAGNOSIS — M4600 Spinal enthesopathy, site unspecified: Secondary | ICD-10-CM

## 2015-08-23 DIAGNOSIS — M47816 Spondylosis without myelopathy or radiculopathy, lumbar region: Secondary | ICD-10-CM

## 2015-08-23 DIAGNOSIS — M5136 Other intervertebral disc degeneration, lumbar region: Secondary | ICD-10-CM

## 2015-08-23 MED ORDER — ORPHENADRINE CITRATE 30 MG/ML IJ SOLN: 60.0000 mg | Freq: Once | INTRAMUSCULAR | Status: DC

## 2015-08-23 MED ORDER — KETOROLAC TROMETHAMINE 60 MG/2ML IM SOLN: 60.0000 mg | Freq: Once | INTRAMUSCULAR | Status: AC

## 2015-08-23 MED ORDER — ORPHENADRINE CITRATE 30 MG/ML IJ SOLN
INTRAMUSCULAR | Status: AC
  Administered 2015-08-23: 15:00:00 via INTRAMUSCULAR
  Filled 2015-08-23: qty 2

## 2015-08-23 MED ORDER — KETOROLAC TROMETHAMINE 60 MG/2ML IM SOLN
INTRAMUSCULAR | Status: AC
  Administered 2015-08-23: 60 mg via INTRAMUSCULAR
  Filled 2015-08-23: qty 2

## 2015-08-23 NOTE — Progress Notes (Signed)
Patient's Name: Stacy Stephenson  Patient type: Established  MRN: [id number]  Service setting: Ambulatory outpatient  DOB: 1975/04/09  Location: [facility]  DOS: 08/23/2015  Primary Care Physician: Tracie Harrier, MD  Note by: Kathlen Brunswick. Dossie Arbour, M.D, DABA, DABAPM, DABPM, Milagros Evener, FIPP  Referring Physician: Tracie Harrier, MD  Specialty: Board-Certified Interventional Pain Management  Last Visit to Pain Management: 08/12/2015   Primary Reason(s) for Visit: Encounter for post-procedure evaluation of chronic illness with mild to moderate exacerbation CC: Back Pain   HPI  [name] is a 40 y.o. year old, female patient, who returns today as an established patient. She has DEPRESSION; MIGRAINE, COMMON; HYPERTENSION; GASTROENTERITIS, ACUTE; Anxiety state; Adult hypothyroidism; Avitaminosis D; Abnormal weight gain; Chronic pain; Long term current use of opiate analgesic; Long term prescription opiate use; Opiate use (22.5 MME/Day); Encounter for therapeutic drug level monitoring; Encounter for chronic pain management; Chronic low back pain (Location of Primary Source of Pain) (Bilateral) (R>L); Lumbar facet syndrome (Bilateral) (R>L); Neurogenic pain; Muscle spasm; Occipital headache; Bilateral occipital neuralgia; Excessive and frequent menstruation with irregular cycle; Claustrophobia; Acute postoperative pain; Lumbosacral spondylosis; Chronic sacroiliac joint pain (Bilateral) (R>L); 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 Her primarily concern today is the Back Pain   Pain Assessment: Self-Reported Pain Score: 6              Reported level is compatible with observation       Pain Type: Chronic pain Pain Location: Back Pain Orientation: Lower Pain Descriptors / Indicators: Tender Pain Frequency: Intermittent  The patient comes into the clinics today for post-procedure evaluation on the interventional treatment done on 08/08/2015. On 08/08/2015 the patient underwent a right sided L2, L3, L4, L5, S1, S2, and S3 medial branch radiofrequency ablation under fluoroscopic guidance and IV sedation. Apparently there was some miscommunication and the patient was not aware that radiofrequency can take up to 6 weeks to recover after the procedure. Until then, the patient may experience more pain than usual. This was the case here and the patient was concerned that something had gone wrong and asked to come in for evaluation. Today we have clarified this misunderstanding and to some extent she feels better about that. However, because of the increased pain she has not been able to work and today we spent a considerable amount of time going over the issues associated with being out of work. I have answered a form that was sent to [place] about that, but I was unable to provide any clear specific cancers since she is still in the acute postoperative period and is having quite a bit of discomfort. Her current level of pain is not abnormal for what has been done.  Today I also took the opportunity to go over the issue of her last 3 urine drug screen test and how they have come back with no oxycodone. In the case of the test done on 02/11/2015 even though there was no oxycodone, a metabolite was found suggesting that the absence of the oxycodone include have been due to a lapse of time since her last dose or  unusual pharmacokinetics with a rapid metabolism. In the case of the  05/08/2015 sample, the lack of oxycodone is simply explained by the fact that the patient's last visit had been on 03/14/2015 and at that time she had received a prescription for oxycodone that should've lasted only until 05/09/2015 however, the patient had indicated that once her pain started coming back she had taken more medication than prescribed and this is the reason why she ran out of medication for her refill appointment. This is confirmed by the fact that on 05/08/2015 there were no peals left to be counted.  In the case of the 07/15/2015 UDS sample, there was no oxycodone present. This one is a little harder to explain since on 05/08/2015 the patient was given enough medication to last until 08/07/2015. The patient was unable to provide a clear explanation as to why that happened and therefore I have warned her today that if this continues to happen, he will be very difficult for me to justify continuing to use a medication that is not showing up on her urine drug screening tests. She understood and accepted.  Today, because of the patient's acute pain and discomfort I will be ordering an IM injection of Toradol 60 mg + Norflex 60 mg. The shots should help in breaking the pain cycle.  Date of Last Visit: 08/08/15 Service Provided on Last Visit: Procedure  Post-Procedure Assessment  Procedure done on last visit: Therapeutic, right-sided, lumbar facet + sacroiliac joint radiofrequency ablation under fluoroscopic guidance and IV sedation. Side-effects or Adverse reactions: Transient post-op increase in pain Sedation: Sedation given  Results: Ultra-Short Term Relief (First 1 hour after procedure): 75 %  Analgesia during this period is likely to be Local Anesthetic and/or IV Sedative (Analgesic/Anxiolitic) related Short Term Relief (Initial 4-6 hrs after procedure): 50 % Complete relief would confirms area to be the source of pain Long Term Relief : 30 % Long-term benefit would suggest an  inflammatory etiology to the pain   Current Relief (Now): The patient is currently in the middle of her postprocedure recovery. Interpretation of Results: Further the termination of the results of this radiofrequency are pending the patient getting over this 6 week recovery period.  Laboratory Chemistry  Inflammation Markers Lab Results  Component Value Date   ESRSEDRATE 12 07/15/2015   CRP 0.9 07/15/2015    Renal Function Lab Results  Component Value Date   BUN 20 07/15/2015   CREATININE 0.82 07/15/2015   GFRAA >60 07/15/2015   GFRNONAA >60 07/15/2015    Hepatic Function Lab Results  Component Value Date   AST 18 07/15/2015   ALT 12* 07/15/2015   ALBUMIN 4.1 07/15/2015    Electrolytes Lab Results  Component Value Date   NA 136 07/15/2015   K 3.4* 07/15/2015   CL 104 07/15/2015   CALCIUM 9.1 07/15/2015   MG 2.1 07/15/2015    Pain Modulating Vitamins Lab Results  Component Value Date   25OHVITD1 73 07/15/2015   25OHVITD2 42 07/15/2015   25OHVITD3 31 07/15/2015   VITAMINB12 2587* 07/15/2015    Coagulation Parameters Lab Results  Component Value Date   PLT 243 03/29/2013    Cardiovascular Lab Results  Component Value Date   HGB 17.0* 03/29/2013   HCT 49.7* 03/29/2013    Note: Lab results reviewed.  Recent Diagnostic Imaging  Mr Lumbar Spine W Wo Contrast  08/09/2015  CLINICAL DATA:  40 year old female with persistent low back pain, status post right L5-S1 laminotomy in 2015. EXAM:  MRI LUMBAR SPINE WITHOUT AND WITH CONTRAST TECHNIQUE: Multiplanar and multiecho pulse sequences of the lumbar spine were obtained without and with intravenous contrast. CONTRAST:  46mL MULTIHANCE GADOBENATE DIMEGLUMINE 529 MG/ML IV SOLN COMPARISON:  Lumbar spine MRI 09/12/2014 FINDINGS: Segmentation: Normal, with the lowest fully formed disc space being labeled as L5-S1, consistent with labeling on the prior examination. Alignment:  Normal Vertebrae: No focal marrow signal  abnormality or compression fracture. Conus medullaris: Extends to the L1 level and appears normal. Paraspinal and other soft tissues: Postsurgical changes within the soft tissues posterior to the L5-S1 level. Disc levels: T12-L1: Normal disc space and facet joints. No spinal canal stenosis. No neuroforaminal stenosis. L1-L2: Normal disc space and facet joints. No spinal canal stenosis. No neuroforaminal stenosis. L2-L3: Normal disc space and facet joints. No spinal canal stenosis. No neuroforaminal stenosis. L3-L4: Normal disc space and facet joints. No spinal canal stenosis. No neuroforaminal stenosis. L4-L5: Normal disc space and facet joints. No spinal canal stenosis. No neuroforaminal stenosis. L5-S1: Status post right laminotomy. Unchanged appearance of right central disc protrusion causing mild right lateral recess stenosis. This protrusion remains in close proximity to, without clearly contacting, the descending right S1 nerve root. Unchanged mild bilateral facet hypertrophy. No significant amount of epidural fibrosis at the surgical site. No spinal canal stenosis. No neuroforaminal stenosis. IMPRESSION: 1. Unchanged postsurgical appearance of prior right L5-S1 laminotomy and partial discectomy without significant epidural fibrosis. 2. Unchanged appearance of small right central L5-S1 disc protrusion with associated mild narrowing of the right lateral recess without displacement of the descending right S1 nerve root. 3. No spinal canal or neural foraminal stenosis. Electronically Signed   By: Ulyses Jarred M.D.   On: 08/09/2015 12:58   Thoracic Imaging: Thoracic DG 2-3 views:  Results for orders placed in visit on 05/15/12  [facility] Thoracic Spine 2 View   Narrative * PRIOR REPORT IMPORTED FROM AN EXTERNAL SYSTEM *   PRIOR REPORT IMPORTED FROM THE [facility] [place] FOR EXAM:    FALL, BACK PAIN  COMMENTS:   May transport without cardiac monitor   PROCEDURE:     DXR - DXR THORACIC  AP AND  LATERAL  - May 16 2012 12:48AM   RESULT:     Thoracic spine images demonstrate no underlying congenital  abnormality. The upper thoracic region is poorly demonstrated but shows  grossly normal alignment on the swimmer's view. The vertebral body heights  and intervertebral disc spaces appear to be maintained.   IMPRESSION:      No acute thoracic spine bony abnormality evident. If an  occult fractures are concern then further investigation with MRI would be  recommended.   Dictation Site: 2       Lumbosacral Imaging: Lumbar MR wo contrast:  Results for orders placed in visit on 10/08/12  MR L Spine Ltd W/O Cm   Narrative * PRIOR REPORT IMPORTED FROM AN EXTERNAL SYSTEM *   PRIOR REPORT IMPORTED FROM THE [facility] WORKFLOW SYSTEM   REASON FOR EXAM:    low back pain eval for HNP  COMMENTS:   PROCEDURE:     MR  - MR LUMBAR SPINE WO CONTRAST  - Oct 08 2012  2:53PM   RESULT:     MR imaging of the lumbar spine in the sagittal and axial  planes  is performed. There is degenerative disc signal change at L5-S1 with some  mild annular disc bulge at that level as well as at L4-L5. The  spinal  canal  is widely patent without significant thecal sac deformity. There some mild  areas of hypertrophy of the ligamentum flavum including L5 and L4. There  some mild facet arthropathy on the left at the L4-L5 level. The conus  medullaris terminates at the L1 level. The included images of the kidneys  and aorta are unremarkable. There is no significant foraminal narrowing  demonstrated. No extraforaminal disc herniation is appreciated. The marrow  signal is normal. There is some mild right L5-S1 foraminal narrowing from  the annular disc bulge. The marrow signal is normal. The spinal alignment  is  maintained.   IMPRESSION:  1. Mild diffuse disc bulge at L5-S1 and to a lesser extent at L4-L5 with  degenerative disc signal change at L5-S1.   Dictation Site: 6       Lumbar MR w/wo contrast:   Results for orders placed during the hospital encounter of 08/09/15  MR Lumbar Spine W Wo Contrast   Narrative CLINICAL DATA:  40 year old female with persistent low back pain, status post right L5-S1 laminotomy in 2015.  EXAM: MRI LUMBAR SPINE WITHOUT AND WITH CONTRAST  TECHNIQUE: Multiplanar and multiecho pulse sequences of the lumbar spine were obtained without and with intravenous contrast.  CONTRAST:  75mL MULTIHANCE GADOBENATE DIMEGLUMINE 529 MG/ML IV SOLN  COMPARISON:  Lumbar spine MRI 09/12/2014  FINDINGS: Segmentation: Normal, with the lowest fully formed disc space being labeled as L5-S1, consistent with labeling on the prior examination.  Alignment:  Normal  Vertebrae: No focal marrow signal abnormality or compression fracture.  Conus medullaris: Extends to the L1 level and appears normal.  Paraspinal and other soft tissues: Postsurgical changes within the soft tissues posterior to the L5-S1 level.  Disc levels:  T12-L1: Normal disc space and facet joints. No spinal canal stenosis. No neuroforaminal stenosis.  L1-L2: Normal disc space and facet joints. No spinal canal stenosis. No neuroforaminal stenosis.  L2-L3: Normal disc space and facet joints. No spinal canal stenosis. No neuroforaminal stenosis.  L3-L4: Normal disc space and facet joints. No spinal canal stenosis. No neuroforaminal stenosis.  L4-L5: Normal disc space and facet joints. No spinal canal stenosis. No neuroforaminal stenosis.  L5-S1: Status post right laminotomy. Unchanged appearance of right central disc protrusion causing mild right lateral recess stenosis. This protrusion remains in close proximity to, without clearly contacting, the descending right S1 nerve root. Unchanged mild bilateral facet hypertrophy. No significant amount of epidural fibrosis at the surgical site. No spinal canal stenosis. No neuroforaminal stenosis.  IMPRESSION: 1. Unchanged postsurgical appearance of  prior right L5-S1 laminotomy and partial discectomy without significant epidural fibrosis. 2. Unchanged appearance of small right central L5-S1 disc protrusion with associated mild narrowing of the right lateral recess without displacement of the descending right S1 nerve root. 3. No spinal canal or neural foraminal stenosis.   Electronically Signed   By: Ulyses Jarred M.D.   On: 08/09/2015 12:58    Lumbar CT w contrast:  Results for orders placed in visit on 06/27/13  CT Lumbar Spine W Contrast   Narrative * PRIOR REPORT IMPORTED FROM AN EXTERNAL SYSTEM *   CLINICAL DATA:  Post lumbar discogram.  Low back pain.   EXAM:  CT LUMBAR SPINE WITH CONTRAST   TECHNIQUE:  Multidetector CT imaging of the lumbar spine was performed after  intra discal contrast was injected at an outside location.  Multiplanar CT image reconstructions were also generated.   COMPARISON:  MRI 10/08/2012   FINDINGS:  No abnormality  is seen at T12-L1, L1-2 or L2-3. Those levels were  not injected.   L3-4: Normal nuclear pattern. No bulge or herniation. No stenosis.   L4-5: Intradiscal contrast is present in the right side of the  nuclear region and in the right lateral and anterior inner annulus.  There is some contrast in the foramen that probably relates to  needle placement. I do not suspect that there is evidence of disc  disease based on these images, but the central nucleus is not  opacified.   L5-S1: There is a small amount of contrast in the central nuclear  region. There is more contrast in the right side of the nuclear  region and the right annulus. There is some contrast in the right  foraminal region which could relate to needle placement/peripheral  injection or to a true annular tear. There does appear to be right  posterior lateral protrusion of disc material which displaces the  right S1 nerve root.   IMPRESSION:  L3-4:  Normal appearance.   L4-5: Somewhat peripheral right-sided  injection. No demonstrable  pathology. Contrast in the foramen on the right probably relates to  needle placement.   L5-S1: Somewhat peripheral right-sided injection, but with some  central nuclear contrast. Right posterior lateral disc protrusion  displaces the right S1 nerve root. Contrast in the foramen on the  right could relate to the peripheral injection or annular tearing.    Electronically Signed    By: Nelson Chimes M.D.    On: 06/28/2013 08:28       Lumbar DG 2-3 views:  Results for orders placed in visit on 05/15/12  DG Lumbar Spine 2-3 Views   Narrative * PRIOR REPORT IMPORTED FROM AN EXTERNAL SYSTEM *   PRIOR REPORT IMPORTED FROM THE [facility] WORKFLOW SYSTEM   REASON FOR EXAM:    FALL, BACK PAIN  COMMENTS:   May transport without cardiac monitor   PROCEDURE:     DXR - DXR LUMBAR SPINE AP AND LATERAL  - May 16 2012  12:48AM   RESULT:     Lumbar spine images show 5 lumbar type vertebral bodies with  preservation of the alignment, disc spaces and vertebral body heights. No  fracture is evident. No foreign body is demonstrated. No congenital  abnormality is evident.   IMPRESSION:  1. No focal abnormality demonstrated.   Dictation Site: 2       Knee Imaging: [id number] MR wo contrast:  Results for orders placed in visit on 10/24/11  MR Knee Right Wo Contrast   Narrative * PRIOR REPORT IMPORTED FROM AN EXTERNAL SYSTEM *   PRIOR REPORT IMPORTED FROM THE [facility] WORKFLOW SYSTEM   REASON FOR EXAM:    Rt knee pain limited range of motion  COMMENTS:   PROCEDURE:     MR  - MR KNEE RT  WO  CONTRAST  - Oct 24 2011  2:25PM   RESULT:     Axial, sagittal and coronal MR imaging of the right knee is  performed. 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 There is a large area of  edema and osteochondral defect in the medial femoral condyle. The marrow  signal is otherwise unremarkable. The medial and lateral  collateral  ligamentous complexes are intact. The quadriceps tendon and patellar  ligament appear to be intact. The ACL and PCL are intact. The anterior and  posterior horns of the lateral meniscus appear  normal. A definite medial  meniscal tear is not evident. No significant joint effusion is seen. A  small  amount of fluid is present.   IMPRESSION:  1. Large osteochondral defect and edema within the medial femoral condyle.  2. Bipartite patella.   Thank you for the opportunity to contribute to the care of your patient.   Dictation Site: 6       Knee-R DG 4 views:  Results for orders placed in visit on 09/28/11  DG Knee Complete 4 Views Right   Narrative * PRIOR REPORT IMPORTED FROM AN EXTERNAL SYSTEM *   PRIOR REPORT IMPORTED FROM THE [facility] WORKFLOW SYSTEM   REASON FOR EXAM:    painful  COMMENTS:   PROCEDURE:     DXR - DXR KNEE RT COMP WITH OBLIQUES  - Sep 28 2011 11:10PM   RESULT:     Comparison: None.   Findings:  No acute fracture. Normal alignment. Possible small knee effusion. There  is  a bipartite patella.   IMPRESSION:  No acute fracture. Possible small knee effusion.   Dictation site: 2       Note: Imaging results reviewed.  Meds  The patient has a current medication list which includes the following prescription(s): clonazepam, cyclobenzaprine, gabapentin, gabapentin, hydrochlorothiazide, ibuprofen, levothyroxine, levothyroxine, lidocaine, magnesium oxide, methocarbamol, metoprolol tartrate, oxycodone, oxycodone, oxycodone, and vitamin d (ergocalciferol), and the following Facility-Administered Medications: ketorolac and orphenadrine.  Current Outpatient Prescriptions on File Prior to Visit  Medication Sig  . clonazePAM (KLONOPIN) 2 MG tablet   . cyclobenzaprine (FLEXERIL) 10 MG tablet Take 1 tablet (10 mg total) by mouth at bedtime.  . gabapentin (NEURONTIN) 100 MG capsule Take 1-3 capsules (100-300 mg total) by mouth 3 (three) times daily. Follow  titration schedule.  . gabapentin (NEURONTIN) 800 MG tablet Take 1 tablet (800 mg total) by mouth at bedtime.  . hydrochlorothiazide (HYDRODIURIL) 25 MG tablet Take 25 mg by mouth every other day. Mon, tues, wed, thurs, fri  . ibuprofen (ADVIL,MOTRIN) 800 MG tablet Take 800 mg by mouth every 8 (eight) hours as needed.   [name] levothyroxine (SYNTHROID, LEVOTHROID) 25 MCG tablet Take 25 mcg by mouth daily before breakfast. saturday  . levothyroxine (SYNTHROID, LEVOTHROID) 50 MCG tablet Take 50 mcg by mouth daily before breakfast. Monday - Friday  . lidocaine (LIDODERM) 5 % Place 1 patch onto the skin daily. Remove & Discard patch within 12 hours or as directed by MD  . Magnesium Oxide 500 MG CAPS Take 1 capsule (500 mg total) by mouth 2 (two) times daily at 8 am and 10 pm.  . methocarbamol (ROBAXIN) 500 MG tablet Take 1 tablet (500 mg total) by mouth 2 (two) times daily as needed for muscle spasms.  . metoprolol tartrate (LOPRESSOR) 25 MG tablet Take 50 mg by mouth at bedtime.   [name] oxyCODONE (OXY IR/ROXICODONE) 5 MG immediate release tablet Take 1 tablet (5 mg total) by mouth every 4 (four) hours as needed for severe pain (For post-RF pain).  [name] oxyCODONE (OXY IR/ROXICODONE) 5 MG immediate release tablet Take 1 tablet (5 mg total) by mouth every 8 (eight) hours as needed for severe pain.  [name] oxyCODONE (OXY IR/ROXICODONE) 5 MG immediate release tablet Take 1 tablet (5 mg total) by mouth every 8 (eight) hours as needed for severe pain.  . Vitamin D, Ergocalciferol, (DRISDOL) 50000 units CAPS capsule Take 50,000 Units by mouth every 7 (seven) days.    No current facility-administered medications on file prior to visit.  ROS  Constitutional: Denies any fever or chills Gastrointestinal: No reported hemesis, hematochezia, vomiting, or acute GI distress Musculoskeletal: Denies any acute onset joint swelling, redness, loss of ROM, or weakness Neurological: No reported episodes of acute onset apraxia, aphasia,  dysarthria, agnosia, amnesia, paralysis, loss of coordination, or loss of consciousness  Allergies  Ms. Trias is allergic to fluoxetine hcl; venlafaxine; zolpidem tartrate; and zolpidem tartrate.  [place]  Medical:  Ms. Langill  has a past medical history of Hypothyroidism; Depression; Migraines; Hypertension; Fibromyalgia; and Cancer ([place]). Family: family history includes Arthritis in her mother; Diabetes in her father; Heart disease in her father; Stroke in her father. Surgical:  has past surgical history that includes Breast surgery and Microdiscectomy lumbar. Tobacco:  reports that she has quit smoking. Her smoking use included Cigarettes. She has a 12 pack-year smoking history. She does not have any smokeless tobacco history on file. Alcohol:  reports that she drinks alcohol. Drug:  reports that she does not use illicit drugs.  Constitutional Exam  Vitals: Blood pressure 154/109, pulse 100, temperature 98.7 F (37.1 C), temperature source Oral, resp. rate 18, height 5\' 1"  (1.549 m), weight 150 lb (68.04 kg), last menstrual period 08/02/2015, SpO2 100 %. General appearance: Well nourished, well developed, and well hydrated. In no acute distress Calculated BMI/Body habitus: Body mass index is 28.36 kg/(m^2). (25-29.9 kg/m2) Overweight - 20% higher incidence of chronic pain Psych/Mental status: Alert and oriented x 3 (person, place, & time) Eyes: PERLA Respiratory: No evidence of acute respiratory distress  Cervical Spine Exam  Inspection: No masses, redness, or swelling Alignment: Symmetrical ROM: Functional: ROM is within functional limits [facility]) Stability: No instability detected Muscle strength & Tone: Functionally intact Sensory: Unimpaired Palpation: No complaints of tenderness  Upper Extremity (UE) Exam    Side: Right upper extremity  Side: Left upper extremity  Inspection: No masses, redness, swelling, or asymmetry  Inspection: No masses, redness, swelling, or asymmetry   ROM:  ROM:  Functional: ROM is within functional limits [facility])        Functional: ROM is within functional limits [facility])        Muscle strength & Tone: Functionally intact  Muscle strength & Tone: Functionally intact  Sensory: Unimpaired  Sensory: Unimpaired  Palpation: No complaints of tenderness  Palpation: No complaints of tenderness   Thoracic Spine Exam  Inspection: No masses, redness, or swelling Alignment: Symmetrical ROM: Functional: ROM is within functional limits [facility]) Stability: No instability detected Sensory: Unimpaired Muscle strength & Tone: Functionally intact Palpation: No complaints of tenderness  Lumbar Spine Exam  Inspection: No masses, redness, or swelling. No evidence of hematomas as previously stated by the patient. Alignment: Symmetrical ROM: Functional: Guarding Stability: No instability detected Muscle strength & Tone: Increased muscle tone and tension Sensory: Abnormal, increased sensitivity to pain (Hyperalgesia) around the area of the facet joints on the right side. Palpation: Hypersensitive to touch Provocative Tests: Lumbar Hyperextension and rotation test: evaluation deferred today       Patrick's Maneuver: evaluation deferred today              Gait & Posture Assessment  Ambulation: Unassisted Gait: Antalgic Posture: WNL   Lower Extremity Exam    Side: Right lower extremity  Side: Left lower extremity  Inspection: No masses, redness, swelling, or asymmetry ROM:  Inspection: No masses, redness, swelling, or asymmetry ROM:  Functional: ROM is within functional limits [facility])        Functional: ROM is within functional limits [facility])  Muscle strength & Tone: Functionally intact  Muscle strength & Tone: Functionally intact  Sensory: Unimpaired  Sensory: Unimpaired  Palpation: No complaints of tenderness  Palpation: No complaints of tenderness}   Assessment & Plan  Primary Diagnosis & Pertinent Problem List: The primary encounter diagnosis  was Chronic low back pain (Location of Primary Source of Pain) (Bilateral) (R>L). 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  Visit Diagnosis: 1. Chronic low back pain (Location of Primary Source of Pain) (Bilateral) (R>L)   2. Chronic pain   3. Lumbar facet syndrome (Bilateral) (R>L)   4. Encounter for therapeutic drug level monitoring   5. Long term current use of opiate analgesic   6. Acute postoperative pain   7. Lumbosacral spondylosis   8. Chronic sacroiliac joint pain (Bilateral) (R>L)   9. 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 Lumbosacral osteoarthritis   11. L4-5 & L5-S1 Disc Bulge   12. Ligamentum flavum hypertrophy L4 and L5 ([place])   13. Lumbar facet arthropathy (L4-5) (Left)   14. Lumbosacral foraminal stenosis (L5-S1) (Right)   15. History of Right L5-S1 Laminotomy   16. Lumbar facet hypertrophy (L5-S1) (Bilateral)   17. L5-S1 paracentral disc protrusion (Right)   18. Lumbosacral lateral recess stenosis (L5-S1) (Right)   19. Abnormal MRI, lumbar spine (08/09/2015)   20. Chronic knee pain (Right)   21. Primary osteoarthritis of right knee     Problem-specific Plan(s): No problem-specific  assessment & plan notes found for this encounter.   Plan of Care   Problem List Items Addressed This Visit      High   Abnormal MRI, lumbar spine (08/09/2015)   Acute postoperative pain   Relevant Medications   orphenadrine (NORFLEX) injection 60 mg   ketorolac (TORADOL) injection 60 mg   Chronic knee pain (Right) (Chronic)   Chronic low back pain (Location of Primary Source of Pain) (Bilateral) (R>L) - Primary (Chronic)   Relevant Medications   orphenadrine (NORFLEX) 30 MG/ML injection (Completed)   ketorolac (TORADOL) 60 MG/2ML injection (Completed)   orphenadrine (NORFLEX) injection 60 mg   ketorolac (TORADOL) injection 60 mg   Other Relevant Orders   Radiofrequency,Lumbar   Chronic pain (Chronic)   Relevant Medications   orphenadrine (NORFLEX) 30 MG/ML injection (Completed)   ketorolac (TORADOL) 60 MG/2ML injection (Completed)   orphenadrine (NORFLEX) injection 60 mg   ketorolac (TORADOL) injection 60 mg   Chronic sacroiliac joint pain (Bilateral) (R>L) (Chronic)   Relevant Medications   orphenadrine (NORFLEX) 30 MG/ML injection (Completed)   ketorolac (TORADOL) 60 MG/2ML injection (Completed)   orphenadrine (NORFLEX) injection 60 mg   ketorolac (TORADOL) injection 60 mg   Other Relevant Orders   Radiofrequency Sacroiliac Joint   History of Right L5-S1 Laminotomy and partial discectomy   L4-5 & L5-S1 Disc Bulge   L5-S1 paracentral disc protrusion (Right)   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)   Lumbar facet arthropathy (  L4-5) (Left)   Lumbar facet hypertrophy (L5-S1) (Bilateral)   Lumbar facet syndrome (Bilateral) (R>L) (Chronic)   Relevant Medications   orphenadrine (NORFLEX) 30 MG/ML injection (Completed)   ketorolac (TORADOL) 60 MG/2ML injection (Completed)   orphenadrine (NORFLEX) injection 60 mg   ketorolac (TORADOL) injection 60 mg   Other Relevant Orders   Radiofrequency,Lumbar   Lumbosacral foraminal stenosis (L5-S1) (Right) (Chronic)    Lumbosacral lateral recess stenosis (L5-S1) (Right)   Lumbosacral osteoarthritis (Chronic)   Relevant Medications   orphenadrine (NORFLEX) 30 MG/ML injection (Completed)   ketorolac (TORADOL) 60 MG/2ML injection (Completed)   orphenadrine (NORFLEX) injection 60 mg   ketorolac (TORADOL) injection 60 mg   Other Relevant Orders   Radiofrequency,Lumbar   Lumbosacral spondylosis (Chronic)   Relevant Medications   orphenadrine (NORFLEX) 30 MG/ML injection (Completed)   ketorolac (TORADOL) 60 MG/2ML injection (Completed)   orphenadrine (NORFLEX) injection 60 mg   ketorolac (TORADOL) injection 60 mg   Other Relevant Orders   Radiofrequency Sacroiliac Joint   Osteoarthritis of knee (Right) (Chronic)   Relevant Medications   orphenadrine (NORFLEX) 30 MG/ML injection (Completed)   ketorolac (TORADOL) 60 MG/2ML injection (Completed)   orphenadrine (NORFLEX) injection 60 mg   ketorolac (TORADOL) injection 60 mg   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 orphenadrine (NORFLEX) 30 MG/ML injection    Sig:     Sharlett Iles, Delores: cabinet override  . ketorolac (TORADOL) 60 MG/2ML injection    Sig:     Sharlett Iles, Delores: cabinet override  . orphenadrine (NORFLEX) injection 60 mg    Sig:   . ketorolac (TORADOL) injection 60 mg    Sig:     Lab-work & Procedure Ordered: Orders Placed This Encounter  Procedures  . Radiofrequency,Lumbar     Standing Status: Future     Number of Occurrences:      Standing Expiration Date: 09/22/2015    Scheduling Instructions:     Side(s): Left-sided     Level(s): L2, L3, L4, L5, & S1 Medial Branch Nerves     Sedation: With Sedation.     Timeframe: in 6 weeks    Order Specific Question:  Where will this procedure be performed?    Answer:  [facility]  . Radiofrequency Sacroiliac Joint    Standing Status: Future     Number of Occurrences:      Standing Expiration Date: 09/22/2015    Scheduling Instructions:     Side(s): Left-sided     Level(s): L4, L5, S1, S2, & S3 Medial Branch Nerves     Sedation: With Sedation.     Timeframe: in 6 weeks  . ToxASSURE Select 13 (MW), Urine    Volume: 30 ml(s). Minimum 3 ml of urine is needed. Document temperature of fresh sample. Indications: Long term (current) use of opiate analgesic [id number])    Imaging Ordered: None  Interventional Therapies: Scheduled:  Left Lumbar facet + Left sacroiliac joint radiofrequency ablation under fluoroscopic  guidance and IV sedation.   Considering:  Pending to complete radiofrequency on both sides. The right lumbar facet and sacroiliac joint were done on 08/08/2015.    PRN Procedures:  Bilateral greater occipital nerve block under fluoroscopic guidance and IV sedation.   Referral(s) or Consult(s): None at this time.  Medications administered during this visit: We administered orphenadrine and ketorolac.  Requested PM Follow-up: Return for Keep prior appointment.  Future Appointments Date Time Provider [place]  09/18/2015 9:40 AM Milinda Pointer, MD [facility] None    Primary Care Physician: Tracie Harrier, MD Location: [facility] Note by: Kathlen Brunswick. Dossie Arbour, M.D, DABA, DABAPM, DABPM, DABIPP, FIPP  Pain Score Disclaimer: We use the NRS-11 scale. This is a self-reported, subjective measurement of pain severity with only modest accuracy. It is used  primarily to identify changes within a particular patient. It must be understood that outpatient pain scales are significantly less accurate that those used for research, where they can be applied under ideal controlled circumstances with minimal exposure to variables. In reality, the score is likely to be a combination of pain intensity and pain affect, where pain affect describes the degree of emotional arousal or changes in action readiness caused by the sensory experience of pain. Factors such as social and work situation, setting, emotional state, anxiety levels, expectation, and prior pain experience may influence pain perception and show large inter-individual differences that may also be affected by time variables.  Patient instructions provided during this appointment: There are no Patient Instructions on file for this visit.

## 2015-08-23 NOTE — Progress Notes (Signed)
Safety precautions to be maintained throughout the outpatient stay will include: orient to surroundings, keep bed in low position, maintain call bell within reach at all times, provide assistance with transfer out of bed and ambulation.  

## 2015-08-26 ENCOUNTER — Telehealth: Payer: Self-pay | Admitting: *Deleted

## 2015-08-26 NOTE — Telephone Encounter (Signed)
Information faxed

## 2015-08-29 ENCOUNTER — Telehealth: Payer: Self-pay | Admitting: Pain Medicine

## 2015-08-29 LAB — TOXASSURE SELECT 13 (MW), URINE

## 2015-08-29 NOTE — Telephone Encounter (Signed)
Wants to speak with [facility] regarding information request by insurance

## 2015-08-30 NOTE — Telephone Encounter (Signed)
Called [employer] (Melissa) at patient request.  [employer] states that this claim has been approved until November 8th.  [place] notified.

## 2015-09-03 ENCOUNTER — Other Ambulatory Visit: Payer: Self-pay | Admitting: Pain Medicine

## 2015-09-03 DIAGNOSIS — M533 Sacrococcygeal disorders, not elsewhere classified: Principal | ICD-10-CM

## 2015-09-03 DIAGNOSIS — M47816 Spondylosis without myelopathy or radiculopathy, lumbar region: Secondary | ICD-10-CM

## 2015-09-03 DIAGNOSIS — G8929 Other chronic pain: Secondary | ICD-10-CM

## 2015-09-18 ENCOUNTER — Ambulatory Visit: Payer: Managed Care, Other (non HMO) | Attending: Pain Medicine | Admitting: Pain Medicine

## 2015-09-18 ENCOUNTER — Encounter: Payer: Self-pay | Admitting: Pain Medicine

## 2015-09-18 VITALS — BP 144/89 | HR 74 | Temp 98.6°F | Resp 16 | Ht 61.0 in | Wt 155.0 lb

## 2015-09-18 DIAGNOSIS — M179 Osteoarthritis of knee, unspecified: Secondary | ICD-10-CM | POA: Insufficient documentation

## 2015-09-18 DIAGNOSIS — E039 Hypothyroidism, unspecified: Secondary | ICD-10-CM | POA: Insufficient documentation

## 2015-09-18 DIAGNOSIS — Z79891 Long term (current) use of opiate analgesic: Secondary | ICD-10-CM | POA: Insufficient documentation

## 2015-09-18 DIAGNOSIS — N920 Excessive and frequent menstruation with regular cycle: Secondary | ICD-10-CM | POA: Diagnosis not present

## 2015-09-18 DIAGNOSIS — M47816 Spondylosis without myelopathy or radiculopathy, lumbar region: Secondary | ICD-10-CM

## 2015-09-18 DIAGNOSIS — R51 Headache: Secondary | ICD-10-CM | POA: Insufficient documentation

## 2015-09-18 DIAGNOSIS — F329 Major depressive disorder, single episode, unspecified: Secondary | ICD-10-CM | POA: Diagnosis not present

## 2015-09-18 DIAGNOSIS — M5127 Other intervertebral disc displacement, lumbosacral region: Secondary | ICD-10-CM | POA: Diagnosis not present

## 2015-09-18 DIAGNOSIS — M62838 Other muscle spasm: Secondary | ICD-10-CM | POA: Insufficient documentation

## 2015-09-18 DIAGNOSIS — M5481 Occipital neuralgia: Secondary | ICD-10-CM | POA: Diagnosis not present

## 2015-09-18 DIAGNOSIS — G8918 Other acute postprocedural pain: Secondary | ICD-10-CM | POA: Insufficient documentation

## 2015-09-18 DIAGNOSIS — Z87891 Personal history of nicotine dependence: Secondary | ICD-10-CM | POA: Diagnosis not present

## 2015-09-18 DIAGNOSIS — Z9889 Other specified postprocedural states: Secondary | ICD-10-CM | POA: Diagnosis not present

## 2015-09-18 DIAGNOSIS — M545 Low back pain: Secondary | ICD-10-CM | POA: Insufficient documentation

## 2015-09-18 DIAGNOSIS — M533 Sacrococcygeal disorders, not elsewhere classified: Secondary | ICD-10-CM | POA: Diagnosis not present

## 2015-09-18 DIAGNOSIS — F1190 Opioid use, unspecified, uncomplicated: Secondary | ICD-10-CM | POA: Diagnosis not present

## 2015-09-18 DIAGNOSIS — G43909 Migraine, unspecified, not intractable, without status migrainosus: Secondary | ICD-10-CM | POA: Insufficient documentation

## 2015-09-18 DIAGNOSIS — Z6829 Body mass index (BMI) 29.0-29.9, adult: Secondary | ICD-10-CM | POA: Insufficient documentation

## 2015-09-18 DIAGNOSIS — R635 Abnormal weight gain: Secondary | ICD-10-CM | POA: Diagnosis not present

## 2015-09-18 DIAGNOSIS — G8929 Other chronic pain: Secondary | ICD-10-CM | POA: Diagnosis not present

## 2015-09-18 DIAGNOSIS — I10 Essential (primary) hypertension: Secondary | ICD-10-CM | POA: Insufficient documentation

## 2015-09-18 DIAGNOSIS — M4807 Spinal stenosis, lumbosacral region: Secondary | ICD-10-CM | POA: Diagnosis not present

## 2015-09-18 DIAGNOSIS — F419 Anxiety disorder, unspecified: Secondary | ICD-10-CM | POA: Insufficient documentation

## 2015-09-18 DIAGNOSIS — E559 Vitamin D deficiency, unspecified: Secondary | ICD-10-CM | POA: Diagnosis not present

## 2015-09-18 MED ORDER — ORPHENADRINE CITRATE 30 MG/ML IJ SOLN
60.0000 mg | Freq: Once | INTRAMUSCULAR | Status: DC
  Filled 2015-09-18: qty 2

## 2015-09-18 MED ORDER — KETOROLAC TROMETHAMINE 60 MG/2ML IM SOLN
60.0000 mg | Freq: Once | INTRAMUSCULAR | Status: AC
  Filled 2015-09-18: qty 2

## 2015-09-18 NOTE — Patient Instructions (Signed)
Radiofrequency Lesioning Radiofrequency lesioning is a procedure that is performed to relieve pain. The procedure is often used for back, neck, or arm pain. Radiofrequency lesioning involves the use of a machine that creates radio waves to make heat. During the procedure, the heat is applied to the nerve that carries the pain signal. The heat damages the nerve and interferes with the pain signal. Pain relief usually lasts for 6 months to 1 year. LET YOUR HEALTH CARE PROVIDER KNOW ABOUT:  Any allergies you have.  All medicines you are taking, including vitamins, herbs, eye drops, creams, and over-the-counter medicines.  Previous problems you or members of your family have had with the use of anesthetics.  Any blood disorders you have.  Previous surgeries you have had.  Any medical conditions you have.  Whether you are pregnant or may be pregnant. RISKS AND COMPLICATIONS Generally, this is a safe procedure. However, problems may occur, including:  Pain or soreness at the injection site.  Infection at the injection site.  Damage to nerves or blood vessels. BEFORE THE PROCEDURE  Ask your health care provider about:  Changing or stopping your regular medicines. This is especially important if you are taking diabetes medicines or blood thinners.  Taking medicines such as aspirin and ibuprofen. These medicines can thin your blood. Do not take these medicines before your procedure if your health care provider instructs you not to.  Follow instructions from your health care provider about eating or drinking restrictions.  Plan to have someone take you home after the procedure.  If you go home right after the procedure, plan to have someone with you for 24 hours. PROCEDURE  You will be given one or more of the following:  A medicine to help you relax (sedative).  A medicine to numb the area (local anesthetic).  You will be awake during the procedure. You will need to be able to  talk with the health care provider during the procedure.  With the help of a type of X-ray (fluoroscopy), the health care provider will insert a radiofrequency needle into the area to be treated.  Next, a wire that carries the radio waves (electrode) will be put through the radiofrequency needle. An electrical pulse will be sent through the electrode to verify the correct nerve. You will feel a tingling sensation, and you may have muscle twitching.  Then, the tissue that is around the needle tip will be heated by an electric current that is passed using the radiofrequency machine. This will numb the nerves.  A bandage (dressing) will be put on the insertion area after the procedure is done. The procedure may vary among health care providers and hospitals. AFTER THE PROCEDURE  Your blood pressure, heart rate, breathing rate, and blood oxygen level will be monitored often until the medicines you were given have worn off.  Return to your normal activities as directed by your health care provider.   This information is not intended to replace advice given to you by your health care provider. Make sure you discuss any questions you have with your health care provider.   Document Released: 09/17/2010 Document Revised: 10/10/2014 Document Reviewed: 02/26/2014 [employer] Interactive Patient Education 2016 [employer]  

## 2015-09-18 NOTE — Progress Notes (Signed)
Patient's Name: Stacy Stephenson  Patient type: Established  MRN: [id number]  Service setting: Ambulatory outpatient  DOB: 23-May-1975  Location: [facility]  DOS: 09/18/2015  Primary Care Physician: Tracie Harrier, MD  Note by: Kathlen Brunswick. Dossie Arbour, M.D, DABA, Riggins, DABPM, DABIPP, FIPP  Referring Physician: Biagio Borg, MD  Specialty: Board-Certified Interventional Pain Management  Last Visit to Pain Management: 08/29/2015   Primary Reason(s) for Visit: Encounter for prescription drug management & post-procedure evaluation of chronic illness with mild to moderate exacerbation(Level of risk: moderate) CC: Back Pain (lower right, sometimes on left side)   HPI  Ms. Stacy Stephenson is a 40 y.o. year old, female patient, who returns today as an established patient. She has DEPRESSION; MIGRAINE, COMMON; HYPERTENSION; GASTROENTERITIS, ACUTE; Anxiety state; Adult hypothyroidism; Avitaminosis D; Abnormal weight gain; Chronic pain; Long term current use of opiate analgesic; Long term prescription opiate use; Opiate use (22.5 MME/Day); Encounter for therapeutic drug level monitoring; Encounter for chronic pain management; Chronic low back pain (Location of Primary Source of Pain) (Bilateral) (R>L); Lumbar facet syndrome (Bilateral) (R>L); Neurogenic pain; Muscle spasm; Occipital headache; Bilateral occipital neuralgia; Excessive and frequent menstruation with irregular cycle; Claustrophobia; Lumbosacral spondylosis; Chronic sacroiliac joint pain (Bilateral) (R>L); 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 Her primarily concern today is the Back Pain (lower right, sometimes on left side)   Pain Assessment: Self-Reported Pain Score: 4              Reported level is compatible with observation       Pain Type: Chronic pain Pain Location: Back Pain Orientation: Right, Lower Pain Descriptors / Indicators: Throbbing, Aching Pain Frequency: Intermittent  The patient comes into the clinics today for post-procedure evaluation on the interventional treatment done on 08/29/2015. In addition, she comes in today for pharmacological management of her chronic pain.  The patient  reports that she does not use drugs.  Date of Last Visit: 08/23/15 Service Provided on Last Visit: Evaluation  Controlled Substance Pharmacotherapy Assessment & REMS (Risk Evaluation and Mitigation Strategy)  Analgesic: Oxycodone IR 5 mg by mouth twice a day (15 mg/day) MME/day: 22.5 mg/day. Pill Count: The patient is not here today for medication refill. Pharmacokinetics: Onset of action (Liberation/Absorption): Within expected pharmacological parameters Time to Peak effect (Distribution): Timing and results are as within normal expected parameters Duration of action (Metabolism/Excretion): Within normal limits for medication Pharmacodynamics: Analgesic Effect: More than 50% Activity Facilitation: Medication(s) allow patient to sit, stand, walk, and do the basic ADLs Perceived Effectiveness: Described as relatively effective, allowing for increase in activities of daily living (ADL) Side-effects or Adverse reactions: None reported Monitoring: [place] PMP: Online review of the past 35-month period conducted. Compliant with practice rules and regulations Last UDS on record: ToxAssure Select 13  Date Value Ref Range Status  08/23/2015 CANCELED      Comment:    Testing of the specimen submitted  could not be completed due to a specimen identification problem.  Patient name or identification on specimen does not match name or identification on requisition. CONTACTED [place] 08/28/2015  Result canceled  by the ancillary    UDS interpretation: The last UDS was canceled due to an IED problem. Today we will repeat the test.          Medication Assessment Form: Reviewed. Patient indicates being compliant with therapy Treatment compliance: Deficiencies noted and steps taken to remind the patient of the seriousness of adequate therapy compliance. We have had problems in the past with not being able to find her oxycodone on the UDS, especially the one done on 02/11/2015, 05/08/2015, and 07/15/2015. Risk Assessment: Aberrant Behavior: None observed today Substance Use Disorder (SUD) Risk Level: Moderate-to-high Risk of opioid abuse or dependence: 0.7-3.0% with doses ? 36 MME/day and 6.1-26% with doses ? 120 MME/day. Opioid Risk Tool (ORT) Score:  4 Moderate Risk for SUD (Score between 4-7) Depression Scale Score: PHQ-2: PHQ-2 Total Score: 0 No depression (0) PHQ-9: PHQ-9 Total Score: 0 No depression (0-4)  Pharmacologic Plan: No change in therapy, at this time  Post-Procedure Assessment  Procedure done on last visit: Right-sided lumbar facet and sacroiliac joint radiofrequency ablation under fluoroscopic guidance and IV sedation. Side-effects or Adverse reactions: None reported Sedation: Sedation given  Results: Ultra-Short Term Relief (First 1 hour after procedure): 90 %  Analgesia during this period is likely to be Local Anesthetic and/or IV Sedative (Analgesic/Anxiolitic) related Short Term Relief (Initial 4-6 hrs after procedure): 90 % Complete relief confirms area to be the source of pain Long Term Relief : 30 % Long-term benefit would suggest an inflammatory etiology to the pain   Current Relief (Now): 30%  Long-term benefit could signal adequate  radiofrequency ablation Interpretation of Results: The patient is pending to have the left side done.  Laboratory Chemistry  Inflammation Markers Lab Results  Component Value Date   ESRSEDRATE 12 07/15/2015   CRP 0.9 07/15/2015    Renal Function Lab Results  Component Value Date   BUN 20 07/15/2015   CREATININE 0.82 07/15/2015   GFRAA >60 07/15/2015   GFRNONAA >60 07/15/2015    Hepatic Function Lab Results  Component Value Date   AST 18 07/15/2015   ALT 12 (L) 07/15/2015   ALBUMIN 4.1 07/15/2015    Electrolytes Lab Results  Component Value Date   NA 136 07/15/2015   K 3.4 (L) 07/15/2015   CL 104 07/15/2015   CALCIUM 9.1 07/15/2015   MG 2.1 07/15/2015    Pain Modulating Vitamins Lab Results  Component Value Date   25OHVITD1 73 07/15/2015   25OHVITD2 42 07/15/2015   25OHVITD3 31 07/15/2015   VITAMINB12 2,587 (H) 07/15/2015    Coagulation Parameters Lab Results  Component Value Date   PLT 243 03/29/2013    Cardiovascular Lab Results  Component Value Date   HGB 17.0 (H) 03/29/2013   HCT 49.7 (H) 03/29/2013    Note: Lab results reviewed.  Recent Diagnostic Imaging  Mr Lumbar Spine W Wo Contrast  Result Date: 08/09/2015 CLINICAL DATA:  40 year old female with persistent low back pain, status post right L5-S1 laminotomy in 2015. EXAM: MRI LUMBAR SPINE WITHOUT AND WITH CONTRAST TECHNIQUE: Multiplanar and multiecho pulse sequences of the lumbar spine were obtained without and with intravenous contrast. CONTRAST:  36mL MULTIHANCE GADOBENATE DIMEGLUMINE 529 MG/ML IV SOLN COMPARISON:  Lumbar spine MRI 09/12/2014 FINDINGS: Segmentation: Normal, with the lowest fully formed disc space being labeled as L5-S1, consistent with labeling on the prior examination. Alignment:  Normal Vertebrae: No focal marrow signal abnormality or compression fracture. Conus medullaris: Extends to the L1 level and appears normal. Paraspinal and  other soft tissues: Postsurgical changes  within the soft tissues posterior to the L5-S1 level. Disc levels: T12-L1: Normal disc space and facet joints. No spinal canal stenosis. No neuroforaminal stenosis. L1-L2: Normal disc space and facet joints. No spinal canal stenosis. No neuroforaminal stenosis. L2-L3: Normal disc space and facet joints. No spinal canal stenosis. No neuroforaminal stenosis. L3-L4: Normal disc space and facet joints. No spinal canal stenosis. No neuroforaminal stenosis. L4-L5: Normal disc space and facet joints. No spinal canal stenosis. No neuroforaminal stenosis. L5-S1: Status post right laminotomy. Unchanged appearance of right central disc protrusion causing mild right lateral recess stenosis. This protrusion remains in close proximity to, without clearly contacting, the descending right S1 nerve root. Unchanged mild bilateral facet hypertrophy. No significant amount of epidural fibrosis at the surgical site. No spinal canal stenosis. No neuroforaminal stenosis. IMPRESSION: 1. Unchanged postsurgical appearance of prior right L5-S1 laminotomy and partial discectomy without significant epidural fibrosis. 2. Unchanged appearance of small right central L5-S1 disc protrusion with associated mild narrowing of the right lateral recess without displacement of the descending right S1 nerve root. 3. No spinal canal or neural foraminal stenosis. Electronically Signed   By: Ulyses Jarred M.D.   On: 08/09/2015 12:58    Meds  The patient has a current medication list which includes the following prescription(s): clonazepam, cyclobenzaprine, gabapentin, gabapentin, hydrochlorothiazide, hydroquinone, ibuprofen, levothyroxine, lidocaine, magnesium oxide, methocarbamol, metoprolol tartrate, niacinamide-zn-cu-methylfolate, ondansetron, oxycodone, oxycodone, oxycodone, and vitamin d (ergocalciferol), and the following Facility-Administered Medications: ketorolac and orphenadrine.  Current Outpatient Prescriptions on File Prior to Visit   Medication Sig  . cyclobenzaprine (FLEXERIL) 10 MG tablet Take 1 tablet (10 mg total) by mouth at bedtime.  . gabapentin (NEURONTIN) 100 MG capsule Take 1-3 capsules (100-300 mg total) by mouth 3 (three) times daily. Follow titration schedule.  . gabapentin (NEURONTIN) 800 MG tablet Take 1 tablet (800 mg total) by mouth at bedtime.  [name] ibuprofen (ADVIL,MOTRIN) 800 MG tablet Take 800 mg by mouth every 8 (eight) hours as needed.   [name] levothyroxine (SYNTHROID, LEVOTHROID) 50 MCG tablet Take 50 mcg by mouth daily before breakfast. Monday - Friday  . lidocaine (LIDODERM) 5 % Place 1 patch onto the skin daily. Remove & Discard patch within 12 hours or as directed by MD  . methocarbamol (ROBAXIN) 500 MG tablet Take 1 tablet (500 mg total) by mouth 2 (two) times daily as needed for muscle spasms.  . metoprolol tartrate (LOPRESSOR) 25 MG tablet Take 50 mg by mouth at bedtime.   [name] oxyCODONE (OXY IR/ROXICODONE) 5 MG immediate release tablet Take 1 tablet (5 mg total) by mouth every 4 (four) hours as needed for severe pain (For post-RF pain).  [name] oxyCODONE (OXY IR/ROXICODONE) 5 MG immediate release tablet Take 1 tablet (5 mg total) by mouth every 8 (eight) hours as needed for severe pain.  [name] oxyCODONE (OXY IR/ROXICODONE) 5 MG immediate release tablet Take 1 tablet (5 mg total) by mouth every 8 (eight) hours as needed for severe pain.  . Vitamin D, Ergocalciferol, (DRISDOL) 50000 units CAPS capsule Take 50,000 Units by mouth every 7 (seven) days.    No current facility-administered medications on file prior to visit.     ROS  Constitutional: Denies any fever or chills Gastrointestinal: No reported hemesis, hematochezia, vomiting, or acute GI distress Musculoskeletal: Denies any acute onset joint swelling, redness, loss of ROM, or weakness Neurological: No reported episodes of acute onset apraxia, aphasia, dysarthria, agnosia, amnesia, paralysis, loss of coordination, or loss of consciousness  Allergies   Ms. Mickiewicz is allergic to fluoxetine hcl; venlafaxine; and zolpidem tartrate.  [place]  Medical:  Ms. Macinnes  has a past medical history of Cancer ([place]); Depression; Fibromyalgia; Hypertension; Hypothyroidism; and Migraines. Family: family history includes Arthritis in her mother; Diabetes in her father; Heart disease in her father; Stroke in her father. Surgical:  has a past surgical history that includes Breast surgery and Microdiscectomy lumbar. Tobacco:  reports that she has quit smoking. Her smoking use included Cigarettes. She has a 12.00 pack-year smoking history. She does not have any smokeless tobacco history on file. Alcohol:  reports that she drinks alcohol. Drug:  reports that she does not use drugs.  Constitutional Exam  Vitals: Blood pressure (!) 144/89, pulse 74, temperature 98.6 F (37 C), temperature source Oral, resp. rate 16, height 5\' 1"  (1.549 m), weight 155 lb (70.3 kg), SpO2 99 %. General appearance: Well nourished, well developed, and well hydrated. In no acute distress Calculated BMI/Body habitus: Body mass index is 29.29 kg/m. (25-29.9 kg/m2) Overweight - 20% higher incidence of chronic pain Psych/Mental status: Alert and oriented x 3 (person, place, & time) Eyes: PERLA Respiratory: No evidence of acute respiratory distress  Cervical Spine Exam  Inspection: No masses, redness, or swelling Alignment: Symmetrical Functional ROM: ROM appears unrestricted Stability: No instability detected Muscle strength & Tone: Functionally intact Sensory: Unimpaired Palpation: Non-contributory  Upper Extremity (UE) Exam    Side: Right upper extremity  Side: Left upper extremity  Inspection: No masses, redness, swelling, or asymmetry  Inspection: No masses, redness, swelling, or asymmetry  Functional ROM: ROM appears unrestricted  Functional ROM: ROM appears unrestricted  Muscle strength & Tone: Functionally intact  Muscle strength & Tone: Functionally intact  Sensory:  Unimpaired  Sensory: Unimpaired  Palpation: Non-contributory  Palpation: Non-contributory   Thoracic Spine Exam  Inspection: No masses, redness, or swelling Alignment: Symmetrical Functional ROM: ROM appears unrestricted Stability: No instability detected Sensory: Unimpaired Muscle strength & Tone: Functionally intact Palpation: Non-contributory  Lumbar Spine Exam  Inspection: No masses, redness, or swelling Alignment: Symmetrical Functional ROM: Decreased ROM Stability: No instability detected Muscle strength & Tone: Functionally intact Sensory: Movement-associated pain Palpation: Complains of area being tender to palpation Provocative Tests: Lumbar Hyperextension and rotation test: Positive on the left for facet joint pain. It would seem that the right side has improved. Patrick's Maneuver: Positive for left-sided S-I joint pain.          It would seem that the right side has improved.  Gait & Posture Assessment  Ambulation: Unassisted Gait: Relatively normal for age and body habitus Posture: WNL   Lower Extremity Exam    Side: Right lower extremity  Side: Left lower extremity  Inspection: No masses, redness, swelling, or asymmetry  Inspection: No masses, redness, swelling, or asymmetry  Functional ROM: ROM appears unrestricted  Functional ROM: ROM appears unrestricted  Muscle strength & Tone: Functionally intact  Muscle strength & Tone: Functionally intact  Sensory: Unimpaired  Sensory: Unimpaired  Palpation: Non-contributory  Palpation: Non-contributory    Assessment & Plan  Primary Diagnosis & Pertinent Problem List: The primary encounter diagnosis was Chronic pain. Diagnoses of Long term current use of opiate analgesic, Opiate use (22.5 MME/Day), Chronic low back pain (Location of Primary Source of Pain) (Bilateral) (R>L), Lumbar facet syndrome (Bilateral) (R>L), Chronic sacroiliac joint pain (Bilateral) (R>L), and Acute postoperative pain were also pertinent to this  visit.  Visit Diagnosis: 1. Chronic pain   2. Long term current use of opiate  analgesic   3. Opiate use (22.5 MME/Day)   4. Chronic low back pain (Location of Primary Source of Pain) (Bilateral) (R>L)   5. Lumbar facet syndrome (Bilateral) (R>L)   6. Chronic sacroiliac joint pain (Bilateral) (R>L)   7. Acute postoperative pain     Problems updated and reviewed during this visit: Problem  Acute Postoperative Pain (Resolved)    Problem-specific Plan(s): No problem-specific Assessment & Plan notes found for this encounter.  No new Assessment & Plan notes have been filed under this hospital service since the last note was generated. Service: Pain Management   Plan of Care   Problem List Items Addressed This Visit      High   RESOLVED: Acute postoperative pain   Chronic low back pain (Location of Primary Source of Pain) (Bilateral) (R>L) (Chronic)   Relevant Medications   orphenadrine (NORFLEX) injection 60 mg   ketorolac (TORADOL) injection 60 mg   Chronic pain - Primary (Chronic)   Relevant Medications   clonazePAM (KLONOPIN) 2 MG tablet   orphenadrine (NORFLEX) injection 60 mg   ketorolac (TORADOL) injection 60 mg   Chronic sacroiliac joint pain (Bilateral) (R>L) (Chronic)   Relevant Medications   orphenadrine (NORFLEX) injection 60 mg   ketorolac (TORADOL) injection 60 mg   Other Relevant Orders   Radiofrequency Sacroiliac Joint   Lumbar facet syndrome (Bilateral) (R>L) (Chronic)   Relevant Medications   orphenadrine (NORFLEX) injection 60 mg   ketorolac (TORADOL) injection 60 mg   Other Relevant Orders   Radiofrequency,Lumbar     Medium   Long term current use of opiate analgesic (Chronic)   Relevant Orders   ToxASSURE Select 13 (MW), Urine   Opiate use (22.5 MME/Day) (Chronic)    Other Visit Diagnoses   None.      Pharmacotherapy (Medications Ordered): Meds ordered this encounter  Medications  . orphenadrine (NORFLEX) injection 60 mg  . ketorolac  (TORADOL) injection 60 mg    Lab-work & Procedure Ordered: Orders Placed This Encounter  Procedures  . Radiofrequency,Lumbar  . Radiofrequency Sacroiliac Joint  . ToxASSURE Select 13 (MW), Urine    Imaging Ordered: None  Interventional Therapies: Scheduled:  Left sided lumbar facet + sacroiliac joint radiofrequency ablation under fluoroscopic guidance and IV sedation.    Considering:   Complete the bilateral lumbar facet and sacroiliac joint radiofrequency ablation.  Should the patient continued to have pain in the lumbar region despite their radiofrequencies, then we need to start looking at the possibility that she may have some discogenic pain.    PRN Procedures:  Bilateral greater occipital nerve block under fluoroscopic guidance and IV sedation.   Referral(s) or Consult(s): None at this time.  New Prescriptions   No medications on file    Medications administered during this visit: Ms. Kammeyer had no medications administered during this visit.  Requested PM Follow-up: Return for Schedule Procedure.  Future Appointments Date Time Provider Luis Lopez  10/15/2015 10:40 AM Milinda Pointer, MD [facility] None    Primary Care Physician: Tracie Harrier, MD Location: [facility] Note by: Kathlen Brunswick. Dossie Arbour, M.D, DABA, DABAPM, DABPM, DABIPP, FIPP  Pain Score Disclaimer: We use the NRS-11 scale. This is a self-reported, subjective measurement of pain severity with only modest accuracy. It is used primarily to identify changes within a particular patient. It must be understood that outpatient pain scales are significantly less accurate that those used for research, where they can be applied under ideal controlled circumstances with minimal exposure to variables.  In reality, the score is likely to be a combination of pain intensity and pain affect, where pain affect describes the degree of emotional arousal or changes in action readiness  caused by the sensory experience of pain. Factors such as social and work situation, setting, emotional state, anxiety levels, expectation, and prior pain experience may influence pain perception and show large inter-individual differences that may also be affected by time variables.  Patient instructions provided at this appointment:: Patient Instructions  Radiofrequency Lesioning Radiofrequency lesioning is a procedure that is performed to relieve pain. The procedure is often used for back, neck, or arm pain. Radiofrequency lesioning involves the use of a machine that creates radio waves to make heat. During the procedure, the heat is applied to the nerve that carries the pain signal. The heat damages the nerve and interferes with the pain signal. Pain relief usually lasts for 6 months to 1 year. LET [facility] CARE PROVIDER KNOW ABOUT:  Any allergies you have.  All medicines you are taking, including vitamins, herbs, eye drops, creams, and over-the-counter medicines.  Previous problems you or members of your family have had with the use of anesthetics.  Any blood disorders you have.  Previous surgeries you have had.  Any medical conditions you have.  Whether you are pregnant or may be pregnant. RISKS AND COMPLICATIONS Generally, this is a safe procedure. However, problems may occur, including:  Pain or soreness at the injection site.  Infection at the injection site.  Damage to nerves or blood vessels. BEFORE THE PROCEDURE  Ask your health care provider about:  Changing or stopping your regular medicines. This is especially important if you are taking diabetes medicines or blood thinners.  Taking medicines such as aspirin and ibuprofen. These medicines can thin your blood. Do not take these medicines before your procedure if your health care provider instructs you not to.  Follow instructions from your health care provider about eating or drinking restrictions.  Plan to  have someone take you home after the procedure.  If you go home right after the procedure, plan to have someone with you for 24 hours. PROCEDURE  You will be given one or more of the following:  A medicine to help you relax (sedative).  A medicine to numb the area (local anesthetic).  You will be awake during the procedure. You will need to be able to talk with the health care provider during the procedure.  With the help of a type of X-ray (fluoroscopy), the health care provider will insert a radiofrequency needle into the area to be treated.  Next, a wire that carries the radio waves (electrode) will be put through the radiofrequency needle. An electrical pulse will be sent through the electrode to verify the correct nerve. You will feel a tingling sensation, and you may have muscle twitching.  Then, the tissue that is around the needle tip will be heated by an electric current that is passed using the radiofrequency machine. This will numb the nerves.  A bandage (dressing) will be put on the insertion area after the procedure is done. The procedure may vary among health care providers and hospitals. AFTER THE PROCEDURE  Your blood pressure, heart rate, breathing rate, and blood oxygen level will be monitored often until the medicines you were given have worn off.  Return to your normal activities as directed by your health care provider.   This information is not intended to replace advice given to you by your health care  provider. Make sure you discuss any questions you have with your health care provider.   Document Released: 09/17/2010 Document Revised: 10/10/2014 Document Reviewed: 02/26/2014 Elsevier Interactive Patient Education [employer].

## 2015-09-26 ENCOUNTER — Telehealth: Payer: Self-pay | Admitting: Pain Medicine

## 2015-09-26 NOTE — Telephone Encounter (Signed)
Wants to speak with Nonnie Done and has some papers she needs filled out, and questions about short term disability

## 2015-09-27 NOTE — Telephone Encounter (Signed)
Information filled out and faxed.  Patient notified.

## 2015-10-01 LAB — TOXASSURE SELECT 13 (MW), URINE: PDF: 0

## 2015-10-13 ENCOUNTER — Encounter: Payer: Self-pay | Admitting: Pain Medicine

## 2015-10-13 NOTE — Progress Notes (Signed)
NOTE: This forensic urine drug screen (UDS) test was conducted using a state-of-the-art ultra high performance liquid chromatography and mass spectrometry system (UPLC/MS-MS), the most sophisticated and accurate method available. UPLC/MS-MS is 1,000 times more precise and accurate than standard gas chromatography and mass spectrometry (GC/MS). This system can analyze 26 drug categories and 180 drug compounds.  Positive, undisclosed use of illicit substance (Cannabinoids). Our program has a "Zero Tolerance" for the use of illicit substances. As a consequence of the results obtained on this test, we will no longer offer controlled substances as a therapeutic option for this patient, until the UDS return clear. In addition, we will check to see if this is a recurrent event. Today the patient will be given a final warning and informed that should this event happen again, we will no longer continue to offer opioids as an alternative treatment option. In the event that our records reveal that this warning had previously been given to the patient, we will then immediately discontinue this mode of therapy.

## 2015-10-15 ENCOUNTER — Encounter: Payer: Self-pay | Admitting: Pain Medicine

## 2015-10-15 ENCOUNTER — Ambulatory Visit
Admission: RE | Admit: 2015-10-15 | Discharge: 2015-10-15 | Disposition: A | Discharge status: Home / Self Care | Payer: Managed Care, Other (non HMO) | Source: Ambulatory Visit | Attending: Pain Medicine | Admitting: Pain Medicine

## 2015-10-15 ENCOUNTER — Ambulatory Visit (HOSPITAL_BASED_OUTPATIENT_CLINIC_OR_DEPARTMENT_OTHER): Payer: Managed Care, Other (non HMO) | Admitting: Pain Medicine

## 2015-10-15 VITALS — BP 161/82 | HR 70 | Temp 98.7°F | Resp 12 | Ht 61.0 in | Wt 153.0 lb

## 2015-10-15 DIAGNOSIS — Z79899 Other long term (current) drug therapy: Secondary | ICD-10-CM

## 2015-10-15 DIAGNOSIS — G8929 Other chronic pain: Secondary | ICD-10-CM

## 2015-10-15 DIAGNOSIS — G8918 Other acute postprocedural pain: Secondary | ICD-10-CM

## 2015-10-15 DIAGNOSIS — Z9114 Patient's other noncompliance with medication regimen: Secondary | ICD-10-CM

## 2015-10-15 DIAGNOSIS — F1210 Cannabis abuse, uncomplicated: Secondary | ICD-10-CM

## 2015-10-15 DIAGNOSIS — M533 Sacrococcygeal disorders, not elsewhere classified: Secondary | ICD-10-CM | POA: Diagnosis not present

## 2015-10-15 DIAGNOSIS — M545 Low back pain, unspecified: Secondary | ICD-10-CM

## 2015-10-15 DIAGNOSIS — M47817 Spondylosis without myelopathy or radiculopathy, lumbosacral region: Secondary | ICD-10-CM | POA: Insufficient documentation

## 2015-10-15 DIAGNOSIS — M47816 Spondylosis without myelopathy or radiculopathy, lumbar region: Secondary | ICD-10-CM

## 2015-10-15 DIAGNOSIS — F1290 Cannabis use, unspecified, uncomplicated: Secondary | ICD-10-CM

## 2015-10-15 DIAGNOSIS — G894 Chronic pain syndrome: Secondary | ICD-10-CM | POA: Insufficient documentation

## 2015-10-15 MED ORDER — TRIAMCINOLONE ACETONIDE 40 MG/ML IJ SUSP: 40.0000 mg | Freq: Once | INTRAMUSCULAR | Status: DC

## 2015-10-15 MED ORDER — MIDAZOLAM HCL 5 MG/5ML IJ SOLN
1.0000 mg | INTRAMUSCULAR | Status: DC | PRN
  Filled 2015-10-15: qty 5

## 2015-10-15 MED ORDER — TRIAMCINOLONE ACETONIDE 40 MG/ML IJ SUSP
INTRAMUSCULAR | Status: AC
  Administered 2015-10-15: 12:00:00
  Filled 2015-10-15: qty 1

## 2015-10-15 MED ORDER — LIDOCAINE HCL (PF) 1 % IJ SOLN
10.0000 mL | Freq: Once | INTRAMUSCULAR | Status: AC
  Administered 2015-10-15: 10 mL

## 2015-10-15 MED ORDER — TRIAMCINOLONE ACETONIDE 40 MG/ML IJ SUSP
40.0000 mg | Freq: Once | INTRAMUSCULAR | Status: AC
  Administered 2015-10-15: 40 mg

## 2015-10-15 MED ORDER — LACTATED RINGERS IV SOLN
1000.0000 mL | Freq: Once | INTRAVENOUS | Status: AC
  Administered 2015-10-15: 1000 mL via INTRAVENOUS

## 2015-10-15 MED ORDER — FENTANYL CITRATE (PF) 100 MCG/2ML IJ SOLN
25.0000 ug | INTRAMUSCULAR | Status: DC | PRN
  Filled 2015-10-15: qty 2

## 2015-10-15 MED ORDER — DIPHENHYDRAMINE HCL 50 MG/ML IJ SOLN
INTRAMUSCULAR | Status: AC
  Filled 2015-10-15: qty 1

## 2015-10-15 MED ORDER — ROPIVACAINE HCL 2 MG/ML IJ SOLN
9.0000 mL | Freq: Once | INTRAMUSCULAR | Status: AC
  Administered 2015-10-15: 9 mL

## 2015-10-15 MED ORDER — OXYCODONE HCL 5 MG PO TABS: 5.0000 mg | ORAL_TABLET | Freq: Four times a day (QID) | ORAL | 0 refills | Status: DC | PRN

## 2015-10-15 MED ORDER — ROPIVACAINE HCL 2 MG/ML IJ SOLN
9.0000 mL | Freq: Once | INTRAMUSCULAR | Status: DC
  Filled 2015-10-15: qty 10

## 2015-10-15 NOTE — Patient Instructions (Addendum)
Pain Management Discharge Instructions  General Discharge Instructions :  If you need to reach your doctor call: Monday-Friday 8:00 am - 4:00 pm at [phone] or toll free [phone].  After clinic hours [phone] to have operator reach doctor.  Bring all of your medication bottles to all your appointments in the pain clinic.  To cancel or reschedule your appointment with Pain Management please remember to call 24 hours in advance to avoid a fee.  Refer to the educational materials which you have been given on: General Risks, I had my Procedure. Discharge Instructions, Post Sedation.  Post Procedure Instructions:  The drugs you were given will stay in your system until tomorrow, so for the next 24 hours you should not drive, make any legal decisions or drink any alcoholic beverages.  You may eat anything you prefer, but it is better to start with liquids then soups and crackers, and gradually work up to solid foods.  Please notify your doctor immediately if you have any unusual bleeding, trouble breathing or pain that is not related to your normal pain.  Depending on the type of procedure that was done, some parts of your body may feel week and/or numb.  This usually clears up by tonight or the next day.  Walk with the use of an assistive device or accompanied by an adult for the 24 hours.  You may use ice on the affected area for the first 24 hours.  Put ice in a Ziploc bag and cover with a towel and place against area 15 minutes on 15 minutes off.  You may switch to heat after 24 hours.Radiofrequency Lesioning, Care After Refer to this sheet in the next few weeks. These instructions provide you with information about caring for yourself after your procedure. Your health care provider may also give you more specific instructions. Your treatment has been planned according to current medical practices, but problems sometimes occur. Call your health care provider if you have any  problems or questions after your procedure. WHAT TO EXPECT AFTER THE PROCEDURE After your procedure, it is common to have:  Pain from the burned nerve.  Temporary numbness. HOME CARE INSTRUCTIONS  Take over-the-counter and prescription medicines only as told by your health care provider.  Return to your normal activities as told by your health care provider. Ask your health care provider what activities are safe for you.  Pay close attention to how you feel after the procedure. If you start to have pain, write down when it hurts and how it feels. This will help you and your health care provider to know if you need an additional treatment.  Check your needle insertion site every day for signs of infection. Watch for:  Redness, swelling, or pain.  Fluid, blood, or pus.  Keep all follow-up visits as told by your health care provider. This is important. SEEK MEDICAL CARE IF:  Your pain does not get better.  You have redness, swelling, or pain at the needle insertion site.  You have fluid, blood, or pus coming from the needle insertion site.  You have a fever. SEEK IMMEDIATE MEDICAL CARE IF:  You develop sudden, severe pain.  You develop numbness or tingling near the procedure site that does not go away.   This information is not intended to replace advice given to you by your health care provider. Make sure you discuss any questions you have with your health care provider.   Document Released: 09/17/2010 Document Revised: 10/10/2014 Document Reviewed: 02/26/2014  Elsevier Interactive Patient Education 2016 [place]. Radiofrequency Lesioning Radiofrequency lesioning is a procedure that is performed to relieve pain. The procedure is often used for back, neck, or arm pain. Radiofrequency lesioning involves the use of a machine that creates radio waves to make heat. During the procedure, the heat is applied to the nerve that carries the pain signal. The heat damages the nerve  and interferes with the pain signal. Pain relief usually lasts for 6 months to 1 year. LET [facility] CARE PROVIDER KNOW ABOUT:  Any allergies you have.  All medicines you are taking, including vitamins, herbs, eye drops, creams, and over-the-counter medicines.  Previous problems you or members of your family have had with the use of anesthetics.  Any blood disorders you have.  Previous surgeries you have had.  Any medical conditions you have.  Whether you are pregnant or may be pregnant. RISKS AND COMPLICATIONS Generally, this is a safe procedure. However, problems may occur, including:  Pain or soreness at the injection site.  Infection at the injection site.  Damage to nerves or blood vessels. BEFORE THE PROCEDURE  Ask your health care provider about:  Changing or stopping your regular medicines. This is especially important if you are taking diabetes medicines or blood thinners.  Taking medicines such as aspirin and ibuprofen. These medicines can thin your blood. Do not take these medicines before your procedure if your health care provider instructs you not to.  Follow instructions from your health care provider about eating or drinking restrictions.  Plan to have someone take you home after the procedure.  If you go home right after the procedure, plan to have someone with you for 24 hours. PROCEDURE  You will be given one or more of the following:  A medicine to help you relax (sedative).  A medicine to numb the area (local anesthetic).  You will be awake during the procedure. You will need to be able to talk with the health care provider during the procedure.  With the help of a type of X-ray (fluoroscopy), the health care provider will insert a radiofrequency needle into the area to be treated.  Next, a wire that carries the radio waves (electrode) will be put through the radiofrequency needle. An electrical pulse will be sent through the electrode to verify  the correct nerve. You will feel a tingling sensation, and you may have muscle twitching.  Then, the tissue that is around the needle tip will be heated by an electric current that is passed using the radiofrequency machine. This will numb the nerves.  A bandage (dressing) will be put on the insertion area after the procedure is done. The procedure may vary among health care providers and hospitals. AFTER THE PROCEDURE  Your blood pressure, heart rate, breathing rate, and blood oxygen level will be monitored often until the medicines you were given have worn off.  Return to your normal activities as directed by your health care provider.   This information is not intended to replace advice given to you by your health care provider. Make sure you discuss any questions you have with your health care provider.   Document Released: 09/17/2010 Document Revised: 10/10/2014 Document Reviewed: 02/26/2014 Elsevier Interactive Patient Education 2016 [place]  What are the risk, side effects and possible complications? Generally speaking, most procedures are safe.  However, with any procedure there are risks, side effects, and the possibility of complications.  The risks and complications are dependent  upon the sites that are lesioned, or the type of nerve block to be performed.  The closer the procedure is to the spine, the more serious the risks are.  Great care is taken when placing the radio frequency needles, block needles or lesioning probes, but sometimes complications can occur. 1. Infection: Any time there is an injection through the skin, there is a risk of infection.  This is why sterile conditions are used for these blocks.  There are four possible types of infection. 1. Localized skin infection. 2. Central Nervous System Infection-This can be in the form of Meningitis, which can be deadly. 3. Epidural Infections-This can be in the form of an epidural  abscess, which can cause pressure inside of the spine, causing compression of the spinal cord with subsequent paralysis. This would require an emergency surgery to decompress, and there are no guarantees that the patient would recover from the paralysis. 4. Discitis-This is an infection of the intervertebral discs.  It occurs in about 1% of discography procedures.  It is difficult to treat and it may lead to surgery.        2. Pain: the needles have to go through skin and soft tissues, will cause soreness.       3. Damage to internal structures:  The nerves to be lesioned may be near blood vessels or    other nerves which can be potentially damaged.       4. Bleeding: Bleeding is more common if the patient is taking blood thinners such as  aspirin, Coumadin, Ticiid, Plavix, etc., or if he/she have some genetic predisposition  such as hemophilia. Bleeding into the spinal canal can cause compression of the spinal  cord with subsequent paralysis.  This would require an emergency surgery to  decompress and there are no guarantees that the patient would recover from the  paralysis.       5. Pneumothorax:  Puncturing of a lung is a possibility, every time a needle is introduced in  the area of the chest or upper back.  Pneumothorax refers to free air around the  collapsed lung(s), inside of the thoracic cavity (chest cavity).  Another two possible  complications related to a similar event would include: Hemothorax and Chylothorax.   These are variations of the Pneumothorax, where instead of air around the collapsed  lung(s), you may have blood or chyle, respectively.       6. Spinal headaches: They may occur with any procedures in the area of the spine.       7. Persistent CSF (Cerebro-Spinal Fluid) leakage: This is a rare problem, but may occur  with prolonged intrathecal or epidural catheters either due to the formation of a fistulous  track or a dural tear.       8. Nerve damage: By working so close to the  spinal cord, there is always a possibility of  nerve damage, which could be as serious as a permanent spinal cord injury with  paralysis.       9. Death:  Although rare, severe deadly allergic reactions known as "Anaphylactic  reaction" can occur to any of the medications used.      10. Worsening of the symptoms:  We can always make thing worse.  What are the chances of something like this happening? Chances of any of this occuring are extremely low.  By statistics, you have more of a chance of getting killed in a motor vehicle accident: while driving to the hospital  than any of the above occurring .  Nevertheless, you should be aware that they are possibilities.  In general, it is similar to taking a shower.  Everybody knows that you can slip, hit your head and get killed.  Does that mean that you should not shower again?  Nevertheless always keep in mind that statistics do not mean anything if you happen to be on the wrong side of them.  Even if a procedure has a 1 (one) in a 1,000,000 (million) chance of going wrong, it you happen to be that one..Also, keep in mind that by statistics, you have more of a chance of having something go wrong when taking medications.  Who should not have this procedure? If you are on a blood thinning medication (e.g. Coumadin, Plavix, see list of "Blood Thinners"), or if you have an active infection going on, you should not have the procedure.  If you are taking any blood thinners, please inform your physician.  How should I prepare for this procedure?  Do not eat or drink anything at least six hours prior to the procedure.  Bring a driver with you .  It cannot be a taxi.  Come accompanied by an adult that can drive you back, and that is strong enough to help you if your legs get weak or numb from the local anesthetic.  Take all of your medicines the morning of the procedure with just enough water to swallow them.  If you have diabetes, make sure that you are  scheduled to have your procedure done first thing in the morning, whenever possible.  If you have diabetes, take only half of your insulin dose and notify our nurse that you have done so as soon as you arrive at the clinic.  If you are diabetic, but only take blood sugar pills (oral hypoglycemic), then do not take them on the morning of your procedure.  You may take them after you have had the procedure.  Do not take aspirin or any aspirin-containing medications, at least eleven (11) days prior to the procedure.  They may prolong bleeding.  Wear loose fitting clothing that may be easy to take off and that you would not mind if it got stained with Betadine or blood.  Do not wear any jewelry or perfume  Remove any nail coloring.  It will interfere with some of our monitoring equipment.  NOTE: Remember that this is not meant to be interpreted as a complete list of all possible complications.  Unforeseen problems may occur.  BLOOD THINNERS The following drugs contain aspirin or other products, which can cause increased bleeding during surgery and should not be taken for 2 weeks prior to and 1 week after surgery.  If you should need take something for relief of minor pain, you may take acetaminophen which is found in Tylenol,m Datril, Anacin-3 and Panadol. It is not blood thinner. The products listed below are.  Do not take any of the products listed below in addition to any listed on your instruction sheet.  A.P.C or A.P.C with Codeine Codeine Phosphate Capsules #3 Ibuprofen Ridaura  ABC compound Congesprin Imuran rimadil  Advil Cope Indocin Robaxisal  Alka-Seltzer Effervescent Pain Reliever and Antacid Coricidin or Coricidin-D  Indomethacin Rufen  Alka-Seltzer plus Cold Medicine Cosprin Ketoprofen S-A-C Tablets  Anacin Analgesic Tablets or Capsules Coumadin Korlgesic Salflex  Anacin Extra Strength Analgesic tablets or capsules CP-2 Tablets Lanoril Salicylate  Anaprox Cuprimine Capsules  Levenox Salocol  Anexsia-D Dalteparin Magan Salsalate  Anodynos Darvon compound Magnesium Salicylate Sine-off  Ansaid Dasin Capsules Magsal Sodium Salicylate  Anturane Depen Capsules Marnal Soma  APF Arthritis pain formula Dewitt's Pills Measurin Stanback  Argesic Dia-Gesic Meclofenamic Sulfinpyrazone  Arthritis Bayer Timed Release Aspirin Diclofenac Meclomen Sulindac  Arthritis pain formula Anacin Dicumarol Medipren Supac  Analgesic (Safety coated) Arthralgen Diffunasal Mefanamic Suprofen  Arthritis Strength Bufferin Dihydrocodeine Mepro Compound Suprol  Arthropan liquid Dopirydamole Methcarbomol with Aspirin Synalgos  ASA tablets/Enseals Disalcid Micrainin Tagament  Ascriptin Doan's Midol Talwin  Ascriptin A/D Dolene Mobidin Tanderil  Ascriptin Extra Strength Dolobid Moblgesic Ticlid  Ascriptin with Codeine Doloprin or Doloprin with Codeine Momentum Tolectin  Asperbuf Duoprin Mono-gesic Trendar  Aspergum Duradyne Motrin or Motrin IB Triminicin  Aspirin plain, buffered or enteric coated Durasal Myochrisine Trigesic  Aspirin Suppositories Easprin Nalfon Trillsate  Aspirin with Codeine Ecotrin Regular or Extra Strength Naprosyn Uracel  Atromid-S Efficin Naproxen Ursinus  Auranofin Capsules Elmiron Neocylate Vanquish  Axotal Emagrin Norgesic Verin  Azathioprine Empirin or Empirin with Codeine Normiflo Vitamin E  Azolid Emprazil Nuprin Voltaren  Bayer Aspirin plain, buffered or children's or timed BC Tablets or powders Encaprin Orgaran Warfarin Sodium  Buff-a-Comp Enoxaparin Orudis Zorpin  Buff-a-Comp with Codeine Equegesic Os-Cal-Gesic   Buffaprin Excedrin plain, buffered or Extra Strength Oxalid   Bufferin Arthritis Strength Feldene Oxphenbutazone   Bufferin plain or Extra Strength Feldene Capsules Oxycodone with Aspirin   Bufferin with Codeine Fenoprofen Fenoprofen Pabalate or Pabalate-SF   Buffets II Flogesic Panagesic   Buffinol plain or Extra Strength Florinal or Florinal with  Codeine Panwarfarin   Buf-Tabs Flurbiprofen Penicillamine   Butalbital Compound Four-way cold tablets Penicillin   Butazolidin Fragmin Pepto-Bismol   Carbenicillin Geminisyn Percodan   Carna Arthritis Reliever Geopen Persantine   Carprofen Gold's salt Persistin   Chloramphenicol Goody's Phenylbutazone   Chloromycetin Haltrain Piroxlcam   Clmetidine heparin Plaquenil   Cllnoril Hyco-pap Ponstel   Clofibrate Hydroxy chloroquine Propoxyphen         Before stopping any of these medications, be sure to consult the physician who ordered them.  Some, such as Coumadin (Warfarin) are ordered to prevent or treat serious conditions such as "deep thrombosis", "pumonary embolisms", and other heart problems.  The amount of time that you may need off of the medication may also vary with the medication and the reason for which you were taking it.  If you are taking any of these medications, please make sure you notify your pain physician before you undergo any procedures.         Pain Management Discharge Instructions  General Discharge Instructions :  If you need to reach your doctor call: Monday-Friday 8:00 am - 4:00 pm at [phone] or toll free [phone].  After clinic hours [phone] to have operator reach doctor.  Bring all of your medication bottles to all your appointments in the pain clinic.  To cancel or reschedule your appointment with Pain Management please remember to call 24 hours in advance to avoid a fee.  Refer to the educational materials which you have been given on: General Risks, I had my Procedure. Discharge Instructions, Post Sedation.  Post Procedure Instructions:  The drugs you were given will stay in your system until tomorrow, so for the next 24 hours you should not drive, make any legal decisions or drink any alcoholic beverages.  You may eat anything you prefer, but it is better to start with liquids then soups and crackers, and gradually work up to solid  foods.  Please notify your  doctor immediately if you have any unusual bleeding, trouble breathing or pain that is not related to your normal pain.  Depending on the type of procedure that was done, some parts of your body may feel week and/or numb.  This usually clears up by tonight or the next day.  Walk with the use of an assistive device or accompanied by an adult for the 24 hours.  You may use ice on the affected area for the first 24 hours.  Put ice in a Ziploc bag and cover with a towel and place against area 15 minutes on 15 minutes off.  You may switch to heat after 24 hours.Radiofrequency Lesioning Radiofrequency lesioning is a procedure that is performed to relieve pain. The procedure is often used for back, neck, or arm pain. Radiofrequency lesioning involves the use of a machine that creates radio waves to make heat. During the procedure, the heat is applied to the nerve that carries the pain signal. The heat damages the nerve and interferes with the pain signal. Pain relief usually lasts for 6 months to 1 year. LET [facility] CARE PROVIDER KNOW ABOUT:  Any allergies you have.  All medicines you are taking, including vitamins, herbs, eye drops, creams, and over-the-counter medicines.  Previous problems you or members of your family have had with the use of anesthetics.  Any blood disorders you have.  Previous surgeries you have had.  Any medical conditions you have.  Whether you are pregnant or may be pregnant. RISKS AND COMPLICATIONS Generally, this is a safe procedure. However, problems may occur, including:  Pain or soreness at the injection site.  Infection at the injection site.  Damage to nerves or blood vessels. BEFORE THE PROCEDURE  Ask your health care provider about:  Changing or stopping your regular medicines. This is especially important if you are taking diabetes medicines or blood thinners.  Taking medicines such as aspirin and ibuprofen. These  medicines can thin your blood. Do not take these medicines before your procedure if your health care provider instructs you not to.  Follow instructions from your health care provider about eating or drinking restrictions.  Plan to have someone take you home after the procedure.  If you go home right after the procedure, plan to have someone with you for 24 hours. PROCEDURE  You will be given one or more of the following:  A medicine to help you relax (sedative).  A medicine to numb the area (local anesthetic).  You will be awake during the procedure. You will need to be able to talk with the health care provider during the procedure.  With the help of a type of X-ray (fluoroscopy), the health care provider will insert a radiofrequency needle into the area to be treated.  Next, a wire that carries the radio waves (electrode) will be put through the radiofrequency needle. An electrical pulse will be sent through the electrode to verify the correct nerve. You will feel a tingling sensation, and you may have muscle twitching.  Then, the tissue that is around the needle tip will be heated by an electric current that is passed using the radiofrequency machine. This will numb the nerves.  A bandage (dressing) will be put on the insertion area after the procedure is done. The procedure may vary among health care providers and hospitals. AFTER THE PROCEDURE  Your blood pressure, heart rate, breathing rate, and blood oxygen level will be monitored often until the medicines you were given have worn  off.  Return to your normal activities as directed by your health care provider.   This information is not intended to replace advice given to you by your health care provider. Make sure you discuss any questions you have with your health care provider.   Document Released: 09/17/2010 Document Revised: 10/10/2014 Document Reviewed: 02/26/2014 Elsevier Interactive Patient Education 2016 [place] Facet Blocks Patient Information  Description: The facets are joints in the spine between the vertebrae.  Like any joints in the body, facets can become irritated and painful.  Arthritis can also effect the facets.  By injecting steroids and local anesthetic in and around these joints, we can temporarily block the nerve supply to them.  Steroids act directly on irritated nerves and tissues to reduce selling and inflammation which often leads to decreased pain.  Facet blocks may be done anywhere along the spine from the neck to the low back depending upon the location of your pain.   After numbing the skin with local anesthetic (like Novocaine), a small needle is passed onto the facet joints under x-ray guidance.  You may experience a sensation of pressure while this is being done.  The entire block usually lasts about 15-25 minutes.   Conditions which may be treated by facet blocks:   Low back/buttock pain  Neck/shoulder pain  Certain types of headaches  Preparation for the injection:  1. Do not eat any solid food or dairy products within 8 hours of your appointment. 2. You may drink clear liquid up to 3 hours before appointment.  Clear liquids include water, black coffee, juice or soda.  No milk or cream please. 3. You may take your regular medication, including pain medications, with a sip of water before your appointment.  Diabetics should hold regular insulin (if taken separately) and take 1/2 normal NPH dose the morning of the procedure.  Carry some sugar containing items with you to your appointment. 4. A driver must accompany you and be prepared to drive you home after your procedure. 5. Bring all your current medications with you. 6. An IV may be inserted and sedation may be given at the discretion of the physician. 7. A blood pressure cuff, EKG and other monitors will often be applied during the procedure.  Some patients may need to have extra oxygen administered for a short  period. 8. You will be asked to provide medical information, including your allergies and medications, prior to the procedure.  We must know immediately if you are taking blood thinners (like Coumadin/Warfarin) or if you are allergic to IV iodine contrast (dye).  We must know if you could possible be pregnant.  Possible side-effects:   Bleeding from needle site  Infection (rare, may require surgery)  Nerve injury (rare)  Numbness & tingling (temporary)  Difficulty urinating (rare, temporary)  Spinal headache (a headache worse with upright posture)  Light-headedness (temporary)  Pain at injection site (serveral days)  Decreased blood pressure (rare, temporary)  Weakness in arm/leg (temporary)  Pressure sensation in back/neck (temporary)   Call if you experience:   Fever/chills associated with headache or increased back/neck pain  Headache worsened by an upright position  New onset, weakness or numbness of an extremity below the injection site  Hives or difficulty breathing (go to the emergency room)  Inflammation or drainage at the injection site(s)  Severe back/neck pain greater than usual  New symptoms which are concerning to you  Please note:  Although the local anesthetic injected can often  make your back or neck feel good for several hours after the injection, the pain will likely return. It takes 3-7 days for steroids to work.  You may not notice any pain relief for at least one week.  If effective, we will often do a series of 2-3 injections spaced 3-6 weeks apart to maximally decrease your pain.  After the initial series, you may be a candidate for a more permanent nerve block of the facets.  If you have any questions, please call #[phone]) [facility]  General Discharge Instructions :  If you need to reach your doctor call: Monday-Friday 8:00 am - 4:00 pm at [phone] or  toll free [phone].  After clinic hours [phone] to have operator reach doctor.  Bring all of your medication bottles to all your appointments in the pain clinic.  To cancel or reschedule your appointment with Pain Management please remember to call 24 hours in advance to avoid a fee.  Refer to the educational materials which you have been given on: General Risks, I had my Procedure. Discharge Instructions, Post Sedation.  Post Procedure Instructions:  The drugs you were given will stay in your system until tomorrow, so for the next 24 hours you should not drive, make any legal decisions or drink any alcoholic beverages.  You may eat anything you prefer, but it is better to start with liquids then soups and crackers, and gradually work up to solid foods.  Please notify your doctor immediately if you have any unusual bleeding, trouble breathing or pain that is not related to your normal pain.  Depending on the type of procedure that was done, some parts of your body may feel week and/or numb.  This usually clears up by tonight or the next day.  Walk with the use of an assistive device or accompanied by an adult for the 24 hours.  You may use ice on the affected area for the first 24 hours.  Put ice in a Ziploc bag and cover with a towel and place against area 15 minutes on 15 minutes off.  You may switch to heat after 24 hours.

## 2015-10-15 NOTE — Progress Notes (Signed)
Patient's Name: Stacy Stephenson  MRN: [id number]  Referring Provider: Barbette Reichmann, MD  DOB: [birth year]  PCP: Barbette Reichmann, MD  DOS: 10/15/2015  Note by: Sydnee Levans. Laban Emperor, MD  Service setting: Ambulatory outpatient  Location: [facility] (AMB) Pain Management Facility  Visit type: Procedure  Specialty: Interventional Pain Management  Patient type: Established   Primary Reason for Visit: Interventional Pain Management Treatment. CC: Back Pain (lower)  Procedure:  Anesthesia, Analgesia, Anxiolysis:  Type: Therapeutic Lumbar Facet + Sacroiliac Joint Radiofrequency Ablation (medial branch of L2, L3, L4, L5, S1, S2, and S3) Region: Lumbosacral Region Level: PSIS (Posterior Superior Iliac Spine) Laterality: Left-Sided    Type: Moderate (Conscious) Sedation & Local Anesthesia Local Anesthetic: Lidocaine 1% Route: Intravenous (IV) IV Access: Secured Sedation: Meaningful verbal contact was maintained at all times during the procedure  Indication(s): Analgesia & Anxiolysis   Indications: 1. Lumbosacral spondylosis   2. Lumbar facet syndrome (Bilateral) (R>L)   3. Chronic sacroiliac joint pain (Bilateral) (R>L)   4. Chronic low back pain (Location of Primary Source of Pain) (Bilateral) (R>L)   5. Chronic pain   6. Acute postoperative pain    The patient has failed to respond to conservative therapies including over-the-counter medications, anti-inflammatories, muscle relaxants, membrane stabilizers, opioids, physical therapy, modalities such as heat and ice, as well as more invasive techniques such as nerve blocks. The patient did attained more than 50% relief of the pain from a series of diagnostic injections conducted in separate occasions.  Pre-procedure Pain Score: 5/10 Post-procedure Pain Score: 0-No pain/10  Pre-Procedure Assessment  Stacy Stephenson is a 40 y.o. year old, female patient, seen today for interventional treatment. She has DEPRESSION; MIGRAINE, COMMON; HYPERTENSION;  GASTROENTERITIS, ACUTE; Anxiety state; Adult hypothyroidism; Avitaminosis D; Abnormal weight gain; Chronic pain; Long term current use of opiate analgesic; Long term prescription opiate use; Opiate use (22.5 MME/Day); Encounter for therapeutic drug level monitoring; Encounter for chronic pain management; Chronic low back pain (Location of Primary Source of Pain) (Bilateral) (R>L); Lumbar facet syndrome (Bilateral) (R>L); Neurogenic pain; Muscle spasm; Occipital headache; Bilateral occipital neuralgia; Excessive and frequent menstruation with irregular cycle; Claustrophobia; Acute postoperative pain; Lumbosacral spondylosis; Chronic sacroiliac joint pain (Bilateral) (R>L); 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 Her primarily concern today is the Back Pain (lower)   Pain Type: Chronic pain Pain Location: Back Pain Orientation: Lower, Left Pain Descriptors / Indicators: Throbbing, Aching Pain Frequency: Constant  Date of Last Visit: 09/18/15 Service Provided on Last Visit: Med Refill  Coagulation Parameters Lab Results  Component Value Date   PLT 243 03/29/2013    Verification of the correct person, correct site (including marking of site), and correct procedure were performed and confirmed by the patient.  Consent: Secured. Under the influence of no sedatives a written informed consent was obtained, after having provided information on the risks and possible complications. To  fulfill our ethical and legal obligations, as recommended by the American Medical Association's Code of Ethics, we have provided information to the patient about our clinical impression; the nature and purpose of the treatment or procedure; the risks, benefits, and possible complications of the  intervention; alternatives; the risk(s) and benefit(s) of the alternative treatment(s) or procedure(s); and the risk(s) and benefit(s) of doing nothing. The patient was provided information about the risks and possible complications associated with the procedure. These include, but are not limited to, failure to achieve desired goals, infection, bleeding, organ or nerve damage, allergic reactions, paralysis, and death. In the case of intra- or periarticular procedures these may include, but are not limited to, failure to achieve desired goals, infection, bleeding (hemarthrosis), organ or nerve damage, allergic reactions, and death. In addition, the patient was informed that Medicine is not an exact science; therefore, there is also the possibility of unforeseen risks and possible complications that may result in a catastrophic outcome. The patient indicated having understood very clearly. We have given the patient no guarantees and we have made no promises. Enough time was given to the patient to ask questions, all of which were answered to the patient's satisfaction.  Consent Attestation: I, the ordering provider, attest that I have discussed with the patient the benefits, risks, side-effects, alternatives, likelihood of achieving goals, and potential problems during recovery for the procedure that I have provided informed consent.  Pre-Procedure Preparation: Safety Precautions: Allergies reviewed. Appropriate site, procedure, and patient were confirmed by following the Joint Commission's Universal Protocol (UP.01.01.01), in the form of a "Time Out". The patient was asked to confirm marked site and procedure, before  commencing. The patient was asked about blood thinners, or active infections, both of which were denied. Patient was assessed for positional comfort and all pressure points were checked before starting procedure. Infection Control Precautions: Sterile technique used. Standard Universal Precautions were taken as recommended by the Department of Operating [employer] Services for Disease Control and Prevention (CDC). Standard pre-surgical skin prep was conducted. Respiratory hygiene and cough etiquette was practiced. Hand hygiene observed. Safe injection practices and needle disposal techniques followed. SDV (single dose vial) medications used. Medications properly checked for expiration dates and contaminants. Personal protective equipment (PPE) used: Sterile Radiation-resistant gloves. Monitoring:  As per clinic protocol. Vitals:   10/15/15 1227 10/15/15 1231 10/15/15 1254 10/15/15 1304  BP: (!) 140/91 (!) 148/80 (!) 161/93 (!) 161/82  Pulse:    70  Resp: '12 14 10 12  '$ Temp:      SpO2: 97% 98% 100% 99%  Weight:      Height:      Calculated BMI: Body mass index is 28.91 kg/m. Allergies: She is allergic to fluoxetine hcl; venlafaxine; and zolpidem tartrate.. Allergy Precautions: None required  Description of Procedure Process:   Time-out: "Time-out" completed before starting procedure, as per protocol. Position: Prone Target Area: Medial branch of L2, L3, L4, L5, S1, S2, and S3 Approach: Posterior, paraspinal, ipsilateral approach. Area Prepped: Entire Lower Lumbosacral Region Prepping solution: ChloraPrep (2% chlorhexidine gluconate and 70% isopropyl alcohol) Safety Precautions: Aspiration looking for blood return was conducted prior to all injections. At no point did we inject any substances, as a needle was being advanced. No attempts were made at seeking any paresthesias. Safe injection practices and needle disposal techniques used. Medications properly checked for expiration dates. SDV (single dose  vial) medications used. Description of the Procedure: Protocol guidelines were followed. The patient was placed in position over the procedure table. The target area was identified and the area prepped in the usual manner. Skin & deeper tissues infiltrated with local anesthetic. Appropriate amount of time allowed to pass for local anesthetics to take effect. The Radiofrequency needles were introduced to the area of the  medial branch at the junction of the superior articular process and transverse process using fluoroscopy. Sensory stimulation using 50 Hz was used to locate & identify the nerve, making sure that the needle was positioned such that there was no sensory stimulation below 0.3 V or above 0.7 V. Stimulation using 2 Hz was used to evaluate the motor component. Care was taken not to lesion any nerves that demonstrated motor stimulation of the lower extremities at an output of less than 2.5 times that of the sensory threshold, or a maximum of 2.0 V. Once satisfactory placement of the needles was achieved, the above solution was slowly injected after negative aspiration. After waiting for at least 2 minutes, the ablation was performed at 80 degrees C for 60 seconds. The needles were then removed and the area cleansed, making sure to leave some of the prepping solution back to take advantage of its long term bactericidal properties. EBL: None Materials & Medications Used:  Needle(s) Used: 22g - 10cm, Teflon-coated, Radiofrequency needle(s) Solution Injected: 0.2% PF-Ropivacaine (43m) + SDV-Triamcinolone '40mg'$ /ml (151m Medications Administered today: We administered lactated ringers, lidocaine (PF), triamcinolone acetonide, ropivacaine (PF) 2 mg/ml (0.2%), and triamcinolone acetonide.Please see chart orders for dosing details.  Imaging Guidance  Type of Imaging Technique: Fluoroscopy Guidance (Spinal) Indication(s): Assistance in needle guidance and placement for procedures requiring needle placement  in or near specific anatomical locations not easily accessible without such assistance. Exposure Time: Please see nurses notes. Contrast: None required. Fluoroscopic Guidance: I was personally present in the fluoroscopy suite, where the patient was placed in position for the procedure, over the fluoroscopy-compatible table. Fluoroscopy was manipulated, using "Tunnel Vision Technique", to obtain the best possible view of the target area, on the affected side. Parallax error was corrected before commencing the procedure. A "direction-depth-direction" technique was used to introduce the needle under continuous pulsed fluoroscopic guidance. Once the target was reached, antero-posterior, oblique, and lateral fluoroscopic projection views were taken to confirm needle placement in all planes. Permanently recorded images stored by scanning into EMR. Interpretation: No contrast injected. Intraoperative imaging interpretation by performing Physician. Adequate needle placement confirmed. Permanent hardcopy images in multiple planes scanned into the patient's record.  Antibiotic Prophylaxis:  Indication(s): No indications identified. Type:  Antibiotics Given (last 72 hours)    None       Post-operative Assessment  Complications: No immediate post-treatment complications were observed. Disposition: The patient was discharged home, once institutional criteria were met. Return to clinic in 6 weeks for follow-up evaluation and interpretation of results. The patient tolerated the entire procedure well. A repeat set of vitals were taken after the procedure and the patient was kept under observation following institutional policy, for this type of procedure. Post-procedural neurological assessment was performed, showing return to baseline, prior to discharge. The patient was provided with post-procedure discharge instructions, including a section on how to identify potential problems. Should any problems arise  concerning this procedure, the patient was given instructions to immediately contact [place]at any time, without hesitation. In any case, we plan to contact the patient by telephone for a follow-up status report regarding this interventional procedure. Comments:  No additional relevant information.  Plan of Care   Problem List Items Addressed This Visit      High   Acute postoperative pain   Relevant Medications   oxyCODONE (OXY IR/ROXICODONE) 5 MG immediate release tablet   Chronic low back pain (Location of Primary Source of Pain) (Bilateral) (R>L) (Chronic)   Relevant Medications   fentaNYL (  SUBLIMAZE) injection 25-50 mcg   triamcinolone acetonide (KENALOG-40) injection 40 mg   triamcinolone acetonide (KENALOG-40) injection 40 mg (Completed)   triamcinolone acetonide (KENALOG-40) 40 MG/ML injection (Completed)   oxyCODONE (OXY IR/ROXICODONE) 5 MG immediate release tablet   Chronic pain (Chronic)   Relevant Medications   fentaNYL (SUBLIMAZE) injection 25-50 mcg   triamcinolone acetonide (KENALOG-40) injection 40 mg   lidocaine (PF) (XYLOCAINE) 1 % injection 10 mL (Completed)   ropivacaine (PF) 2 mg/ml (0.2%) (NAROPIN) epidural 9 mL   triamcinolone acetonide (KENALOG-40) injection 40 mg (Completed)   ropivacaine (PF) 2 mg/ml (0.2%) (NAROPIN) epidural 9 mL (Completed)   triamcinolone acetonide (KENALOG-40) 40 MG/ML injection (Completed)   oxyCODONE (OXY IR/ROXICODONE) 5 MG immediate release tablet   Chronic sacroiliac joint pain (Bilateral) (R>L) (Chronic)   Relevant Medications   fentaNYL (SUBLIMAZE) injection 25-50 mcg   triamcinolone acetonide (KENALOG-40) injection 40 mg   lidocaine (PF) (XYLOCAINE) 1 % injection 10 mL (Completed)   triamcinolone acetonide (KENALOG-40) injection 40 mg (Completed)   ropivacaine (PF) 2 mg/ml (0.2%) (NAROPIN) epidural 9 mL (Completed)   triamcinolone acetonide (KENALOG-40) 40 MG/ML injection (Completed)   oxyCODONE (OXY IR/ROXICODONE) 5 MG immediate  release tablet   Lumbar facet syndrome (Bilateral) (R>L) (Chronic)   Relevant Medications   fentaNYL (SUBLIMAZE) injection 25-50 mcg   lactated ringers infusion 1,000 mL (Completed)   midazolam (VERSED) 5 MG/5ML injection 1-2 mg   triamcinolone acetonide (KENALOG-40) injection 40 mg   lidocaine (PF) (XYLOCAINE) 1 % injection 10 mL (Completed)   ropivacaine (PF) 2 mg/ml (0.2%) (NAROPIN) epidural 9 mL   triamcinolone acetonide (KENALOG-40) injection 40 mg (Completed)   triamcinolone acetonide (KENALOG-40) 40 MG/ML injection (Completed)   oxyCODONE (OXY IR/ROXICODONE) 5 MG immediate release tablet   Other Relevant Orders   Informed Consent Details: Transcribe to consent form and obtain patient signature   Informed Consent Details: Transcribe to consent form and obtain patient signature   Lumbosacral spondylosis - Primary (Chronic)   Relevant Medications   fentaNYL (SUBLIMAZE) injection 25-50 mcg   triamcinolone acetonide (KENALOG-40) injection 40 mg   triamcinolone acetonide (KENALOG-40) injection 40 mg (Completed)   triamcinolone acetonide (KENALOG-40) 40 MG/ML injection (Completed)   oxyCODONE (OXY IR/ROXICODONE) 5 MG immediate release tablet   Other Relevant Orders   DG C-Arm 1-60 Min-No Report (Completed)    Other Visit Diagnoses   None.     Requested PM Follow-up: Return in about 6 weeks (around 11/26/2015) for Post-Procedure evaluation.  Future Appointments Date Time Provider [place]  11/27/2015 10:00 AM Milinda Pointer, MD [facility] None    Primary Care Physician: Tracie Harrier, MD Location: [facility] Outpatient Pain Management Facility Note by: Kathlen Brunswick. Dossie Arbour, M.D, DABA, DABAPM, DABPM, DABIPP, FIPP  Disclaimer:  Medicine is not an exact science. The only guarantee in medicine is that nothing is guaranteed. It is important to note that the decision to proceed with this intervention was based on the information collected from the patient. The Data and  conclusions were drawn from the patient's questionnaire, the interview, and the physical examination. Because the information was provided in large part by the patient, it cannot be guaranteed that it has not been purposely or unconsciously manipulated. Every effort has been made to obtain as much relevant data as possible for this evaluation. It is important to note that the conclusions that lead to this procedure are derived in large part from the available data. Always take into account that the treatment will also be dependent on availability of  resources and existing treatment guidelines, considered by other Pain Management Practitioners as being common knowledge and practice, at the time of the intervention. For Medico-Legal purposes, it is also important to point out that variation in procedural techniques and pharmacological choices are the acceptable norm. The indications, contraindications, technique, and results of the above procedure should only be interpreted and judged by a Board-Certified Interventional Pain Specialist with extensive familiarity and expertise in the same exact procedure and technique. Attempts at providing opinions without similar or greater experience and expertise than that of the treating physician will be considered as inappropriate and unethical, and shall result in a formal complaint to the state medical board and applicable specialty societies.

## 2015-10-15 NOTE — Progress Notes (Signed)
Safety precautions to be maintained throughout the outpatient stay will include: orient to surroundings, keep bed in low position, maintain call bell within reach at all times, provide assistance with transfer out of bed and ambulation.  

## 2015-10-16 ENCOUNTER — Telehealth: Payer: Self-pay | Admitting: *Deleted

## 2015-10-16 NOTE — Telephone Encounter (Signed)
No problems post procedure. 

## 2015-10-17 ENCOUNTER — Telehealth: Payer: Self-pay | Admitting: Pain Medicine

## 2015-10-17 NOTE — Telephone Encounter (Signed)
Patient wants to speak with [facility]

## 2015-10-22 ENCOUNTER — Ambulatory Visit: Payer: Managed Care, Other (non HMO) | Admitting: Pain Medicine

## 2015-10-29 ENCOUNTER — Encounter: Payer: Self-pay | Admitting: *Deleted

## 2015-11-11 ENCOUNTER — Other Ambulatory Visit: Payer: Self-pay | Admitting: Pain Medicine

## 2015-11-11 ENCOUNTER — Telehealth: Payer: Self-pay

## 2015-11-11 DIAGNOSIS — M792 Neuralgia and neuritis, unspecified: Secondary | ICD-10-CM

## 2015-11-11 NOTE — Telephone Encounter (Signed)
Attempted to call patient.  Left message.  

## 2015-11-11 NOTE — Telephone Encounter (Signed)
Patient called you back. Please call her.

## 2015-11-11 NOTE — Telephone Encounter (Signed)
[employer]  Called to resend forms.

## 2015-11-12 NOTE — Telephone Encounter (Signed)
[employer] form sent back.

## 2015-11-15 ENCOUNTER — Telehealth: Payer: Self-pay | Admitting: *Deleted

## 2015-11-18 ENCOUNTER — Telehealth: Payer: Self-pay

## 2015-11-18 ENCOUNTER — Ambulatory Visit: Payer: Managed Care, Other (non HMO) | Attending: Pain Medicine | Admitting: Physical Therapy

## 2015-11-18 DIAGNOSIS — M1288 Other specific arthropathies, not elsewhere classified, other specified site: Secondary | ICD-10-CM | POA: Diagnosis not present

## 2015-11-18 DIAGNOSIS — G8929 Other chronic pain: Secondary | ICD-10-CM

## 2015-11-18 DIAGNOSIS — M545 Low back pain, unspecified: Secondary | ICD-10-CM

## 2015-11-18 DIAGNOSIS — M2560 Stiffness of unspecified joint, not elsewhere classified: Secondary | ICD-10-CM | POA: Insufficient documentation

## 2015-11-18 NOTE — Telephone Encounter (Signed)
Patient called and left vm for Kori. She said they sent  Another fax for you. She did not say what this was regarding. She wants you to call her

## 2015-11-19 ENCOUNTER — Encounter: Payer: Self-pay | Admitting: Physical Therapy

## 2015-11-19 NOTE — Therapy (Signed)
[facility] [address] [place], [place], [address] Phone: [phone]   Fax:  [phone]  Physical Therapy Evaluation  Patient Details  Name: NADYAH FILIPIAK MRN: [id number] Date of Birth: [birth year] Referring Provider: Dr. Dossie Arbour  Encounter Date: 11/18/2015      PT End of Session - 11/19/15 1036    Visit Number 1   Number of Visits 1   Date for PT Re-Evaluation 11/19/15   PT Start Time 1257   PT Stop Time 1503   PT Time Calculation (min) 126 min   Activity Tolerance Patient limited by pain      Past Medical History:  Diagnosis Date  . Cancer (HCC)    cervical  . Depression   . Fibromyalgia   . Hypertension   . Hypothyroidism   . Migraines     Past Surgical History:  Procedure Laterality Date  . BREAST SURGERY    . MICRODISCECTOMY LUMBAR      There were no vitals filed for this visit.       Subjective Assessment - 11/19/15 1032    Subjective Pt. reports chronic h/o LBP since falling backwards while playing softball in April of 2013.   Limitations Standing;Walking;House hold activities;Lifting   Patient Stated Goals complete FCE report   Currently in Pain? Yes   Pain Score 6    Pain Location Back   Pain Orientation Right;Left;Lower;Mid   Pain Descriptors / Indicators Aching;Burning;Constant   Pain Type Chronic pain            [facility] PT Assessment - 11/19/15 0001      Assessment   Medical Diagnosis Low back pain   Referring Provider Dr. Dossie Arbour   Onset Date/Surgical Date 05/04/11      SEE FCE REPORT       Plan - 11/19/15 1037    Clinical Impression Statement See FCE report (scanned in EPIC and faxed to MD).     Rehab Potential Fair   PT Frequency 1x / week   PT Treatment/Interventions ADLs/Self Care Home Management;Therapeutic exercise;Therapeutic activities;Functional mobility training;Gait training;Neuromuscular re-education;Patient/family education   PT Next Visit Plan FCE only       Patient will benefit from skilled therapeutic intervention in order to improve the following deficits and impairments:  Abnormal gait, Pain, Postural dysfunction, Decreased mobility, Decreased activity tolerance, Decreased endurance, Decreased range of motion, Decreased strength, Hypomobility  Visit Diagnosis: Chronic midline low back pain without sciatica  Joint stiffness     Problem List Patient Active Problem List   Diagnosis Date Noted  . Marijuana use 10/15/2015  . Pain medication agreement broken 10/15/2015  . Acute postoperative pain 08/23/2015  . Lumbosacral spondylosis 08/23/2015  . Chronic sacroiliac joint pain (Bilateral) (R>L) 08/23/2015  . 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 Lumbosacral osteoarthritis 08/23/2015  . L4-5 & L5-S1 Disc Bulge 08/23/2015  . Ligamentum flavum hypertrophy L4 and L5 ([place]) 08/23/2015  . Lumbar facet arthropathy (L4-5) (Left) 08/23/2015  . Lumbosacral foraminal stenosis (L5-S1) (Right) 08/23/2015  . History of Right L5-S1 Laminotomy and partial discectomy 08/23/2015  . Lumbar facet hypertrophy (L5-S1) (Bilateral) 08/23/2015  . L5-S1 paracentral disc protrusion (Right) 08/23/2015  . Lumbosacral lateral recess stenosis (L5-S1) (Right) 08/23/2015  . Abnormal MRI, lumbar spine (08/09/2015) 08/23/2015  . Chronic knee pain (Right) 08/23/2015  . Osteoarthritis of knee (Right) 08/23/2015  . Claustrophobia 08/08/2015  . Excessive and frequent menstruation with irregular cycle 05/30/2015  . Occipital headache 05/08/2015  .  Bilateral occipital neuralgia 05/08/2015  . Chronic pain 02/11/2015  . Long term current use of opiate analgesic 02/11/2015  . Long term prescription opiate use 02/11/2015  . Opiate use (22.5 MME/Day) 02/11/2015  . Encounter for therapeutic drug level monitoring 02/11/2015  . Encounter for chronic pain management 02/11/2015  . Chronic low back pain (Location of Primary Source of Pain)  (Bilateral) (R>L) 02/11/2015  . Lumbar facet syndrome (Bilateral) (R>L) 02/11/2015  . Neurogenic pain 02/11/2015  . Muscle spasm 02/11/2015  . Anxiety state 01/31/2014  . Adult hypothyroidism 11/28/2013  . Avitaminosis D 11/28/2013  . Abnormal weight gain 11/28/2013  . MIGRAINE, COMMON 06/23/2007  . GASTROENTERITIS, ACUTE 06/23/2007  . DEPRESSION 01/24/2007  . HYPERTENSION 01/24/2007   Pura Spice, PT, DPT # [phone] 11/19/2015, 10:39 AM  [facility] [address] [place], [place], [address] Phone: [phone]   Fax:  [phone]  Name: KYRSTAL BEBEE MRN: [id number] Date of Birth: 04-28-1975

## 2015-11-27 ENCOUNTER — Ambulatory Visit: Payer: Managed Care, Other (non HMO) | Attending: Pain Medicine | Admitting: Pain Medicine

## 2015-11-27 ENCOUNTER — Encounter: Payer: Self-pay | Admitting: Pain Medicine

## 2015-11-27 VITALS — BP 142/95 | HR 73 | Temp 98.6°F | Resp 16 | Ht 62.0 in | Wt 154.0 lb

## 2015-11-27 DIAGNOSIS — M797 Fibromyalgia: Secondary | ICD-10-CM | POA: Insufficient documentation

## 2015-11-27 DIAGNOSIS — M4807 Spinal stenosis, lumbosacral region: Secondary | ICD-10-CM | POA: Diagnosis not present

## 2015-11-27 DIAGNOSIS — M62838 Other muscle spasm: Secondary | ICD-10-CM

## 2015-11-27 DIAGNOSIS — Z79891 Long term (current) use of opiate analgesic: Secondary | ICD-10-CM | POA: Diagnosis not present

## 2015-11-27 DIAGNOSIS — F1290 Cannabis use, unspecified, uncomplicated: Secondary | ICD-10-CM

## 2015-11-27 DIAGNOSIS — M792 Neuralgia and neuritis, unspecified: Secondary | ICD-10-CM

## 2015-11-27 DIAGNOSIS — G8929 Other chronic pain: Secondary | ICD-10-CM

## 2015-11-27 DIAGNOSIS — I10 Essential (primary) hypertension: Secondary | ICD-10-CM | POA: Insufficient documentation

## 2015-11-27 DIAGNOSIS — M545 Low back pain: Secondary | ICD-10-CM | POA: Diagnosis not present

## 2015-11-27 DIAGNOSIS — M533 Sacrococcygeal disorders, not elsewhere classified: Secondary | ICD-10-CM

## 2015-11-27 DIAGNOSIS — M5136 Other intervertebral disc degeneration, lumbar region: Secondary | ICD-10-CM | POA: Insufficient documentation

## 2015-11-27 DIAGNOSIS — Z859 Personal history of malignant neoplasm, unspecified: Secondary | ICD-10-CM | POA: Insufficient documentation

## 2015-11-27 DIAGNOSIS — Z9114 Patient's other noncompliance with medication regimen: Secondary | ICD-10-CM

## 2015-11-27 DIAGNOSIS — Z823 Family history of stroke: Secondary | ICD-10-CM | POA: Diagnosis not present

## 2015-11-27 DIAGNOSIS — N920 Excessive and frequent menstruation with regular cycle: Secondary | ICD-10-CM | POA: Diagnosis not present

## 2015-11-27 DIAGNOSIS — M48061 Spinal stenosis, lumbar region without neurogenic claudication: Secondary | ICD-10-CM | POA: Insufficient documentation

## 2015-11-27 DIAGNOSIS — Z833 Family history of diabetes mellitus: Secondary | ICD-10-CM | POA: Insufficient documentation

## 2015-11-27 DIAGNOSIS — Z87891 Personal history of nicotine dependence: Secondary | ICD-10-CM | POA: Insufficient documentation

## 2015-11-27 DIAGNOSIS — Z8249 Family history of ischemic heart disease and other diseases of the circulatory system: Secondary | ICD-10-CM | POA: Insufficient documentation

## 2015-11-27 DIAGNOSIS — E039 Hypothyroidism, unspecified: Secondary | ICD-10-CM | POA: Diagnosis not present

## 2015-11-27 DIAGNOSIS — R892 Abnormal level of other drugs, medicaments and biological substances in specimens from other organs, systems and tissues: Secondary | ICD-10-CM

## 2015-11-27 DIAGNOSIS — G894 Chronic pain syndrome: Secondary | ICD-10-CM | POA: Insufficient documentation

## 2015-11-27 DIAGNOSIS — Z888 Allergy status to other drugs, medicaments and biological substances status: Secondary | ICD-10-CM | POA: Insufficient documentation

## 2015-11-27 DIAGNOSIS — F329 Major depressive disorder, single episode, unspecified: Secondary | ICD-10-CM | POA: Insufficient documentation

## 2015-11-27 DIAGNOSIS — M1288 Other specific arthropathies, not elsewhere classified, other specified site: Secondary | ICD-10-CM

## 2015-11-27 DIAGNOSIS — M47817 Spondylosis without myelopathy or radiculopathy, lumbosacral region: Secondary | ICD-10-CM | POA: Insufficient documentation

## 2015-11-27 DIAGNOSIS — M47816 Spondylosis without myelopathy or radiculopathy, lumbar region: Secondary | ICD-10-CM

## 2015-11-27 MED ORDER — ORPHENADRINE CITRATE 30 MG/ML IJ SOLN
INTRAMUSCULAR | Status: AC
  Administered 2015-11-27: 30 mg via INTRAMUSCULAR
  Filled 2015-11-27: qty 2

## 2015-11-27 MED ORDER — ORPHENADRINE CITRATE 30 MG/ML IJ SOLN: 60.0000 mg | Freq: Once | INTRAMUSCULAR | Status: DC

## 2015-11-27 MED ORDER — METHOCARBAMOL 500 MG PO TABS: 500.0000 mg | ORAL_TABLET | Freq: Two times a day (BID) | ORAL | 1 refills | Status: DC

## 2015-11-27 MED ORDER — GABAPENTIN 100 MG PO CAPS: 100.0000 mg | ORAL_CAPSULE | Freq: Three times a day (TID) | ORAL | 1 refills | Status: DC

## 2015-11-27 MED ORDER — CYCLOBENZAPRINE HCL 10 MG PO TABS: 10.0000 mg | ORAL_TABLET | Freq: Every day | ORAL | 1 refills | Status: DC

## 2015-11-27 MED ORDER — GABAPENTIN 800 MG PO TABS: 800.0000 mg | ORAL_TABLET | Freq: Every day | ORAL | 1 refills | Status: DC

## 2015-11-27 MED ORDER — KETOROLAC TROMETHAMINE 60 MG/2ML IM SOLN: 60.0000 mg | Freq: Once | INTRAMUSCULAR | Status: AC

## 2015-11-27 MED ORDER — KETOROLAC TROMETHAMINE 60 MG/2ML IM SOLN
INTRAMUSCULAR | Status: AC
  Administered 2015-11-27: 60 mg via INTRAMUSCULAR
  Filled 2015-11-27: qty 2

## 2015-11-27 NOTE — Progress Notes (Signed)
Certified letter sent to home address and returned to clinic. Letter given to patient today upon arrival to clinic.

## 2015-11-27 NOTE — Progress Notes (Signed)
Patient's Name: Stacy Stephenson  MRN: [id number]  Referring Provider: Tracie Harrier, MD  DOB: 1975/06/25  PCP: Tracie Harrier, MD  DOS: 11/27/2015  Note by: Kathlen Brunswick. Dossie Arbour, MD  Service setting: Ambulatory outpatient  Specialty: Interventional Pain Management  Location: [facility] (AMB) Pain Management Facility    Patient type: Established   Primary Reason(s) for Visit: Encounter for prescription drug management & post-procedure evaluation of chronic illness with mild to moderate exacerbation(Level of risk: moderate) CC: Back Pain (lower )  HPI  Stacy Stephenson is a 40 y.o. year old, female patient, who comes today for an initial evaluation. She has DEPRESSION; MIGRAINE, COMMON; HYPERTENSION; GASTROENTERITIS, ACUTE; Anxiety state; Adult hypothyroidism; Avitaminosis D; Abnormal weight gain; Chronic pain; Long term current use of opiate analgesic; Long term prescription opiate use; Opiate use (22.5 MME/Day); Encounter for therapeutic drug level monitoring; Encounter for chronic pain management; Chronic low back pain (Location of Primary Source of Pain) (Bilateral) (R>L); Lumbar facet syndrome (Bilateral) (R>L); Neurogenic pain; Muscle spasm; Occipital headache; Bilateral occipital neuralgia; Excessive and frequent menstruation with irregular cycle; Claustrophobia; Lumbosacral spondylosis; Chronic sacroiliac joint pain (Bilateral) (R>L); 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); Lumbosacral osteoarthritis; L4-5 & L5-S1 Disc Bulge; Ligamentum flavum hypertrophy L4 and L5 ([place]); Lumbar facet arthropathy (L4-5) (Left); Lumbosacral foraminal stenosis (L5-S1) (Right); History of Right L5-S1 Laminotomy and partial discectomy; Lumbar facet hypertrophy (L5-S1) (Bilateral); L5-S1 paracentral disc protrusion (Right); Lumbosacral lateral recess stenosis (L5-S1) (Right); Abnormal MRI, lumbar spine (08/09/2015); Chronic knee pain (Right); Osteoarthritis of knee (Right); Marijuana use; Pain medication agreement  broken; Discogenic low back pain; Hypothyroidism; and Abnormal drug screen on her problem list.. Her primarily concern today is the Back Pain (lower )  Pain Assessment: Self-Reported Pain Score: 4  (Dr Dossie Arbour pain scale given )/10 Clinically the patient looks like a 2/10 Reported level is inconsistent with clinical observations. Information on the proper use of the pain score provided to the patient today. Pain Type: Chronic pain Pain Location: Back Pain Orientation: Mid, Lower Pain Descriptors / Indicators: Aching, Burning, Constant Pain Frequency: Constant  Stacy Stephenson was last seen on 11/11/2015 for a procedure. During today's appointment we reviewed Stacy Stephenson's post-procedure results, as well as her outpatient medication regimen.  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 At this point the patient has reached "Maximum Medical Improvement" (MMI) and is not expected to improve any further. At this point it is fairly clear that her chronic pain condition is permanent. Based on the "Guides to the Evaluation of Permanent Impairment" (6th Edition) of the American Medical Association, this patient seems to have a Whole Person Impairment (WPI) of 24%.  Apparently the address provided by the patient was inaccurate as she did not get the certified letter that we sent informing her that we would not be prescribing any more opioids due to the last UDS being positive for cannabinoids. Today we have personally provided the patient with this letter and although it states that the decision is a reversible would not open for discussion, it did trigger a long discussion about the results. We also went into the results of her functional capacity evaluation and based on these and her current pain I believe that she probably has discogenic low back pain, in addition to her facet and sacroiliac joint syndromes.  The patient is still wanting for [place] to eliminate her pain and today, once more I have explained  to the patient that all I can do is try to manage the pain but that I will not be able to eliminate it as she wants [place] to do. This is to be a recurrent theme and despite the fact that I keep explaining to her that she has a realistic expectations, she continues to bring this up. In addition, she seems to be having problems citing what to do with her job since she indicates that she "wants to go back to work", but admits that it is not likely that she will be able to do that since she doesn't feel that she can do the work. Today I was very strict about this and last her if she felt that she could go back to work at a light physical demand level, as stated by the functional capacity evaluation? She simply stated that she did not think that this would be possible even though she does want to go back to work. Based on my evaluation of her case, believe that she is very close to having reached maximal medical improvement in terms of her pain management and of course this will be more complicated now that we will not be able to use the opioids due to her abnormal UDS.  Further details on both, my assessment(s), as well as the proposed treatment plan, please see below.   Controlled Substance Pharmacotherapy Assessment REMS (Risk Evaluation and Mitigation Strategy)  Analgesic:Oxycodone IR 5 mg by mouth twice a day (15 mg/day) MME/day:22.5 mg/day.  Stacy Glassing, RN  11/27/2015 10:37 AM  Sign at close encounter Nursing Pain Medication Assessment:  Safety precautions to be maintained throughout the outpatient stay will include: orient to surroundings, keep bed in low position, maintain call bell within reach at all times, provide assistance with transfer out of bed and ambulation.  Medication Inspection Compliance: Stacy Stephenson did not comply with our request to bring her pills to be counted. She was reminded that  bringing the medication bottles, even when empty, is a requirement. Pill Count: No pills available to be counted today. Bottle Appearance: No container available. Did not bring bottle(s) to appointment. Medication: See above Filled Date: N/A Medication last intake:  Pharmacokinetics: Liberation and absorption (onset of action): WNL Distribution (time to peak effect): WNL Metabolism and excretion (duration of action): WNL         Pharmacodynamics: Desired effects: Analgesia: The patient reports >50% benefit. Reported improvement in function: The patient reports medication allows her to accomplish basic ADLs. Clinically meaningful improvement in function (CMIF): Sustained CMIF goals met Perceived effectiveness: Described as relatively effective, allowing for increase in activities of daily living (ADL) Undesirable effects: Side-effects or Adverse reactions: None reported Monitoring: [place] PMP: Online review of the past 31-monthperiod conducted. Compliant with practice rules and regulations List of all UDS test(s) done:  Lab Results  Component Value Date   TOXASSSELUR FINAL 09/18/2015   TOXASSSELUR CANCELED 08/23/2015   [place] 07/15/2015   [place] 05/08/2015   [place]AL 02/11/2015   Last UDS on record: ToxAssure Select 13  Date Value Ref Range Status  09/18/2015 FINAL  Final    Comment:    ==================================================================== TOXASSURE SELECT 13 (MW) ==================================================================== Test                             Result       Flag       Units Drug Present and Declared for Prescription  Verification   7-aminoclonazepam              573          EXPECTED   ng/mg creat    7-aminoclonazepam is an expected metabolite of clonazepam. Source    of clonazepam is a scheduled prescription medication.   Oxycodone                      309          EXPECTED   ng/mg creat   Oxymorphone                     995          EXPECTED   ng/mg creat   Noroxycodone                   457          EXPECTED   ng/mg creat   Noroxymorphone                 316          EXPECTED   ng/mg creat    Sources of oxycodone are scheduled prescription medications.    Oxymorphone, noroxycodone, and noroxymorphone are expected    metabolites of oxycodone. Oxymorphone is also available as a    scheduled prescription medication. Drug Present not Declared for Prescription Verification   Carboxy-THC                    24           UNEXPECTED ng/mg creat    Carboxy-THC is a metabolite of tetrahydrocannabinol  (THC).    Source of [facility] is most commonly illicit, but THC is also present    in a scheduled prescription medication. ==================================================================== Test                      Result    Flag   Units      Ref Range   Creatinine              75               mg/dL      >=20 ==================================================================== Declared Medications:  The flagging and interpretation on this report are based on the  following declared medications.  Unexpected results may arise from  inaccuracies in the declared medications.  **Note: The testing scope of this panel includes these medications:  Clonazepam (Klonopin)  Oxycodone  **Note: The testing scope of this panel does not include following  reported medications:  Cyclobenzaprine (Flexeril)  Gabapentin (Neurontin)  Hydrochlorothiazide (Hydrodiuril)  Ibuprofen  Levothyroxine (Levothroid)  Levothyroxine (Synthroid)  Lidocaine (Lidoderm)  Magnesium (Mag-Ox)  Methocarbamol (Robaxin)  Metoprolol (Lopressor)  Ondansetron (Zofran)  Topical  Vitamin D2 (Drisdol) ==================================================================== For clinical consultation, please call [phone]. ====================================================================    UDS interpretation: Non-Compliant Unreported use of  cannabinoids (illegal substances). Medication Assessment Form: Discrepancies found between patient's report and information collected Treatment compliance: Non-compliant Risk Assessment Profile: Aberrant behavior: use of illicit substances Comorbid factors increasing risk of overdose: See prior notes. No additional risks detected today Risk of substance use disorder (SUD): Very High Opioid Risk Tool (ORT) Total Score:  4  Interpretation Table:  Score <3 = Low Risk for SUD  Score between 4-7 = Moderate Risk for SUD  Score >8 = High Risk for Opioid Abuse   Risk Mitigation Strategies:  Patient Counseling:  Patient instructed to stay away from the use of any illegal substances. Patient-Prescriber Agreement (PPA): Medication agreement broken by the patient.  Notification to other healthcare providers: Today we will stop the use of controlled substances.  Pharmacologic Plan: Opioid therapy discontinued  Post-Procedure Assessment  Procedure done on 11/11/2015: Left lumbar facet and sacroiliac joint radiofrequency ablation under fluoroscopic guidance and IV sedation. BMI: 28.17 kg/m Technical challenges experienced at the time of the procedure: None Factor complicating assessment: None         Side-effects or adverse reactions: None reported Reported issues: None reported Sedation: Sedation provided. When no sedatives are used, the analgesic levels obtained are directly associated to the effectiveness of the local anesthetics. However, when sedation is provided, the level of analgesia obtained during the initial 1 hour following the intervention, is believed to be the result of a combination of factors. These factors may include, but are not limited to: 1. The effectiveness of the local anesthetics used. 2. The effects of the analgesic(s) and/or anxiolytic(s) used. 3. The degree of discomfort experienced by the patient at the time of the procedure. 4. The patients ability and reliability in  recalling and recording the events. 5. The presence and influence of possible secondary gains and/or psychosocial factors. Reported result: Relief experienced during the 1st hour after the procedure: 100 % (Ultra-Short Term Relief) Interpretative annotation: Analgesia during this period is likely to be Local Anesthetic and/or IV Sedative (Analgesic/Anxiolitic) related Effects of local anesthetic: The analgesic effects attained during this period are directly associated to the localized infiltration of local anesthetics and therefore cary significant diagnostic value as to the etiological location, or anatomical origin, of the pain. Expected duration of relief is directly dependent on the pharmacodynamics of the local anesthetic used. Long-acting (4-6 hours) anesthetics used.  Reported result: Relief during the next 4 to 6 hour after the procedure: 100 % (Short-Term Relief) Interpretative annotation: Complete relief would suggest area to be the source of the pain Long-term benefit: Defined as the period of time past the expected duration of local anesthetics. With the possible exception of prolonged sympathetic blockade from the local anesthetics, benefits during this period are typically attributed to, or associated with, other factors such as analgesic sensory neuropraxia, antiinflammatory effects, or beneficial biochemical changes provided by agents other than the local anesthetics Reported result: Extended relief following procedure: 50 % (lasting 2 weeks ) (Long-Term Relief) Interpretative annotation: Good relief. Possible therapeutic success. Benefit could signal adequate RF ablation Current benefits: Defined as persistent relief that continues at this point in time.   Reported results: Treated area: 50% Interpretative annotation: Long-term benefit would suggest adequate RF ablation  Interpretation: Results would suggest adequate radiofrequency ablation.          Laboratory Chemistry   Inflammation Markers Lab Results  Component Value Date   ESRSEDRATE 12 07/15/2015   CRP 0.9 07/15/2015   Renal Function Lab Results  Component Value Date   BUN 20 07/15/2015   CREATININE 0.82 07/15/2015   GFRAA >60 07/15/2015   GFRNONAA >60 07/15/2015   Hepatic Function Lab Results  Component Value Date   AST 18 07/15/2015   ALT 12 (L) 07/15/2015   ALBUMIN 4.1 07/15/2015   Electrolytes Lab Results  Component Value Date   NA 136 07/15/2015   K 3.4 (L) 07/15/2015   CL 104 07/15/2015   CALCIUM 9.1 07/15/2015   MG 2.1 07/15/2015   Pain Modulating Vitamins Lab Results  Component Value Date  25OHVITD1 73 07/15/2015   25OHVITD2 42 07/15/2015   25OHVITD3 31 07/15/2015   VITAMINB12 2,587 (H) 07/15/2015   Coagulation Parameters Lab Results  Component Value Date   PLT 243 03/29/2013   Cardiovascular Lab Results  Component Value Date   HGB 17.0 (H) 03/29/2013   HCT 49.7 (H) 03/29/2013   Note: Lab results reviewed.  Recent Diagnostic Imaging Review  Dg C-arm 1-60 Min-no Report  Result Date: 10/15/2015 CLINICAL DATA: Sub-acute chronic pain syndrome C-ARM 1-60 MINUTES Fluoroscopy was utilized by the requesting physician.  No radiographic interpretation.   Note: Imaging results reviewed.  Meds  The patient has a current medication list which includes the following prescription(s): clindamycin-benzoyl peroxide, clonazepam, cyclobenzaprine, gabapentin, gabapentin, hydrochlorothiazide, hydroquinone, ibuprofen, levothyroxine, lidocaine, magnesium oxide, methocarbamol, metoprolol tartrate, niacinamide-zn-cu-methylfolate, ondansetron, phentermine, and vitamin d (ergocalciferol), and the following Facility-Administered Medications: ketorolac and orphenadrine.  Current Outpatient Prescriptions on File Prior to Visit  Medication Sig  . clonazePAM (KLONOPIN) 2 MG tablet Take by mouth.  . hydrochlorothiazide (HYDRODIURIL) 25 MG tablet TAKE 1 TABLET BY MOUTH ONCE A DAY  .  hydroquinone 4 % cream APPLY TO FACE AT BEDTIME  . ibuprofen (ADVIL,MOTRIN) 800 MG tablet Take 800 mg by mouth every 8 (eight) hours as needed.   [name] levothyroxine (SYNTHROID, LEVOTHROID) 50 MCG tablet Take 50 mcg by mouth daily before breakfast. Monday - Friday  . lidocaine (LIDODERM) 5 % Place 1 patch onto the skin daily. Remove & Discard patch within 12 hours or as directed by MD  . magnesium oxide (MAG-OX) 400 MG tablet Take by mouth.  . metoprolol tartrate (LOPRESSOR) 25 MG tablet Take 50 mg by mouth at bedtime.   . Niacinamide-Zn-Cu-Methylfolate (NICOMIDE PO) Take by mouth daily.  . ondansetron (ZOFRAN) 4 MG tablet TAKE 1 TABLET (4 MG TOTAL) BY MOUTH 3 (THREE) TIMES DAILY AS NEEDED FOR NAUSEA FOR UP TO 7 DAYS.  [name] Vitamin D, Ergocalciferol, (DRISDOL) 50000 units CAPS capsule Take 50,000 Units by mouth every 7 (seven) days.    No current facility-administered medications on file prior to visit.    ROS  Constitutional: Denies any fever or chills Gastrointestinal: No reported hemesis, hematochezia, vomiting, or acute GI distress Musculoskeletal: Denies any acute onset joint swelling, redness, loss of ROM, or weakness Neurological: No reported episodes of acute onset apraxia, aphasia, dysarthria, agnosia, amnesia, paralysis, loss of coordination, or loss of consciousness  Allergies  Ms. Talamantez is allergic to fluoxetine hcl; venlafaxine; and zolpidem tartrate.  [place]  Drug: Ms. Panebianco  reports that she does not use drugs. Alcohol:  reports that she drinks alcohol. Tobacco:  reports that she has quit smoking. Her smoking use included Cigarettes. She has a 12.00 pack-year smoking history. She has never used smokeless tobacco. Medical:  has a past medical history of Cancer ([place]); Depression; Fibromyalgia; Hypertension; Hypothyroidism; and Migraines. Family: family history includes Arthritis in her mother; Diabetes in her father; Heart disease in her father; Stroke in her father.  Past Surgical  History:  Procedure Laterality Date  . BREAST SURGERY    . MICRODISCECTOMY LUMBAR     Constitutional Exam  General appearance: Well nourished, well developed, and well hydrated. In no apparent acute distress Vitals:   11/27/15 1028  BP: (!) 142/95  Pulse: 73  Resp: 16  Temp: 98.6 F (37 C)  SpO2: 100%  Weight: 154 lb (69.9 kg)  Height: _0  (1.575 m)   BMI Assessment: Estimated body mass index is 28.17 kg/m as calculated from the following:  Height as of this encounter: _0  (1.575 m).   Weight as of this encounter: 154 lb (69.9 kg).  BMI interpretation table: BMI level Category Range association with higher incidence of chronic pain  <18 kg/m2 Underweight   18.5-24.9 kg/m2 Ideal body weight   25-29.9 kg/m2 Overweight Increased incidence by 20%  30-34.9 kg/m2 Obese (Class I) Increased incidence by 68%  35-39.9 kg/m2 Severe obesity (Class II) Increased incidence by 136%  >40 kg/m2 Extreme obesity (Class III) Increased incidence by 254%   BMI Readings from Last 4 Encounters:  11/27/15 28.17 kg/m  10/15/15 28.91 kg/m  09/18/15 29.29 kg/m  08/23/15 28.34 kg/m   Wt Readings from Last 4 Encounters:  11/27/15 154 lb (69.9 kg)  10/15/15 153 lb (69.4 kg)  09/18/15 155 lb (70.3 kg)  08/23/15 150 lb (68 kg)  Psych/Mental status: Alert, oriented x 3 (person, place, & time) Eyes: PERLA Respiratory: No evidence of acute respiratory distress  Cervical Spine Exam  Inspection: No masses, redness, or swelling Alignment: Symmetrical Functional ROM: Unrestricted ROM Stability: No instability detected Muscle strength & Tone: Functionally intact Sensory: Unimpaired Palpation: Non-contributory  Upper Extremity (UE) Exam    Side: Right upper extremity  Side: Left upper extremity  Inspection: No masses, redness, swelling, or asymmetry  Inspection: No masses, redness, swelling, or asymmetry  Functional ROM: Unrestricted ROM         Functional ROM: Unrestricted ROM           Muscle strength & Tone: Functionally intact  Muscle strength & Tone: Functionally intact  Sensory: Unimpaired  Sensory: Unimpaired  Palpation: Non-contributory  Palpation: Non-contributory   Thoracic Spine Exam  Inspection: No masses, redness, or swelling Alignment: Symmetrical Functional ROM: Unrestricted ROM Stability: No instability detected Sensory: Unimpaired Muscle strength & Tone: Functionally intact Palpation: Non-contributory  Lumbar Spine Exam  Inspection: No masses, redness, or swelling Alignment: Symmetrical Functional ROM: Improved after treatment Stability: No instability detected Muscle strength & Tone: Functionally intact Sensory: Unimpaired Palpation: Non-contributory Provocative Tests: Lumbar Hyperextension and rotation test: evaluation deferred today       Patrick's Maneuver: evaluation deferred today              Gait & Posture Assessment  Ambulation: Unassisted Gait: Relatively normal for age and body habitus Posture: WNL   Lower Extremity Exam    Side: Right lower extremity  Side: Left lower extremity  Inspection: No masses, redness, swelling, or asymmetry  Inspection: No masses, redness, swelling, or asymmetry  Functional ROM: Unrestricted ROM          Functional ROM: Unrestricted ROM          Muscle strength & Tone: Functionally intact  Muscle strength & Tone: Functionally intact  Sensory: Unimpaired  Sensory: Unimpaired  Palpation: Non-contributory  Palpation: Non-contributory   Assessment  Primary Diagnosis & Pertinent Problem List: The primary encounter diagnosis was Chronic pain. Diagnoses of Chronic low back pain (Location of Primary Source of Pain) (Bilateral) (R>L), Chronic sacroiliac joint pain (Bilateral) (R>L), Muscle spasm, Neuropathic pain, Lumbar facet syndrome (Bilateral) (R>L), Discogenic low back pain, Abnormal drug screen, Pain medication agreement broken, and Marijuana use were also pertinent to this visit.  Visit Diagnosis: 1.  Chronic pain   2. Chronic low back pain (Location of Primary Source of Pain) (Bilateral) (R>L)   3. Chronic sacroiliac joint pain (Bilateral) (R>L)   4. Muscle spasm   5. Neuropathic pain   6. Lumbar facet syndrome (Bilateral) (R>L)   7. Discogenic low back  pain   8. Abnormal drug screen   9. Pain medication agreement broken   10. Marijuana use    Plan of Care  Pharmacotherapy (Medications Ordered): Meds ordered this encounter  Medications  . methocarbamol (ROBAXIN) 500 MG tablet    Sig: Take 1 tablet (500 mg total) by mouth 2 (two) times daily.    Dispense:  180 tablet    Refill:  1    Do not place this medication, or any other prescription from our practice, on "Automatic Refill". Patient may have prescription filled one day early if pharmacy is closed on scheduled refill date.  . cyclobenzaprine (FLEXERIL) 10 MG tablet    Sig: Take 1 tablet (10 mg total) by mouth at bedtime.    Dispense:  90 tablet    Refill:  1    Do not place this medication, or any other prescription from our practice, on "Automatic Refill". Patient may have prescription filled one day early if pharmacy is closed on scheduled refill date.  . gabapentin (NEURONTIN) 800 MG tablet    Sig: Take 1 tablet (800 mg total) by mouth at bedtime.    Dispense:  90 tablet    Refill:  1    Do not add this medication to the electronic "Automatic Refill" notification system. Patient may have prescription filled one day early if pharmacy is closed on scheduled refill date.  . gabapentin (NEURONTIN) 100 MG capsule    Sig: Take 1-3 capsules (100-300 mg total) by mouth 3 (three) times daily. Follow titration schedule.    Dispense:  810 capsule    Refill:  1    Do not place this medication, or any other prescription from our practice, on "Automatic Refill". Patient may have prescription filled one day early if pharmacy is closed on scheduled refill date.  . orphenadrine (NORFLEX) injection 60 mg  . ketorolac (TORADOL) injection  60 mg  . orphenadrine (NORFLEX) 30 MG/ML injection    COCHRAN, HANNAH: cabinet override  . ketorolac (TORADOL) 60 MG/2ML injection    COCHRAN, HANNAH: cabinet override   New Prescriptions   No medications on file   Medications administered during this visit: We administered orphenadrine and ketorolac. Lab-work, Procedure(s), & Referral(s) Ordered: Orders Placed This Encounter  Procedures  . Radiofrequency,Lumbar  . Radiofrequency Sacroiliac Joint   Imaging & Referral(s) Ordered: None  Interventional Therapies: Pending/Scheduled/Planned:   None at this time.    Considering:   Palliative left-sided lumbar facet and sacroiliac joint radiofrequency ablation under fluoroscopic guidance and IV sedation.  Bilateral greater occipital nerve block under fluoroscopic guidance and IV sedation.  Bilateral greater occipital nerve radiofrequency ablation.    PRN Procedures:   Palliative left-sided lumbar facet and sacroiliac joint radiofrequency ablation under fluoroscopic guidance and IV sedation.   Bilateral greater occipital nerve block under fluoroscopic guidance and IV sedation.    Requested PM Follow-up: Return in about 6 months (around 05/27/2016) for Med-Mgmt.  Future Appointments Date Time Provider [place]  05/07/2016 10:15 AM Milinda Pointer, MD [facility] None   Primary Care Physician: Tracie Harrier, MD Location: [facility] Outpatient Pain Management Facility Note by: Kathlen Brunswick. Dossie Arbour, M.D, DABA, DABAPM, DABPM, DABIPP, FIPP  Pain Score Disclaimer: We use the NRS-11 scale. This is a self-reported, subjective measurement of pain severity with only modest accuracy. It is used primarily to identify changes within a particular patient. It must be understood that outpatient pain scales are significantly less accurate that those used for research, where they can be applied under ideal  controlled circumstances with minimal exposure to variables. In reality, the score is  likely to be a combination of pain intensity and pain affect, where pain affect describes the degree of emotional arousal or changes in action readiness caused by the sensory experience of pain. Factors such as social and work situation, setting, emotional state, anxiety levels, expectation, and prior pain experience may influence pain perception and show large inter-individual differences that may also be affected by time variables.  Patient instructions provided during this appointment: Patient Instructions  Pain Management Discharge Instructions  General Discharge Instructions :  If you need to reach your doctor call: Monday-Friday 8:00 am - 4:00 pm at [phone] or toll free [phone].  After clinic hours [phone] to have operator reach doctor.  Bring all of your medication bottles to all your appointments in the pain clinic.  To cancel or reschedule your appointment with Pain Management please remember to call 24 hours in advance to avoid a fee.  Refer to the educational materials which you have been given on: General Risks, I had my Procedure. Discharge Instructions, Post Sedation.  Post Procedure Instructions:  The drugs you were given will stay in your system until tomorrow, so for the next 24 hours you should not drive, make any legal decisions or drink any alcoholic beverages.  You may eat anything you prefer, but it is better to start with liquids then soups and crackers, and gradually work up to solid foods.  Please notify your doctor immediately if you have any unusual bleeding, trouble breathing or pain that is not related to your normal pain.  Depending on the type of procedure that was done, some parts of your body may feel week and/or numb.  This usually clears up by tonight or the next day.  Walk with the use of an assistive device or accompanied by an adult for the 24 hours.  You may use ice on the affected area for the first 24 hours.  Put ice in a Ziploc  bag and cover with a towel and place against area 15 minutes on 15 minutes off.  You may switch to heat after 24 hours.

## 2015-11-27 NOTE — Patient Instructions (Signed)

## 2015-11-27 NOTE — Progress Notes (Signed)
Nursing Pain Medication Assessment:  Safety precautions to be maintained throughout the outpatient stay will include: orient to surroundings, keep bed in low position, maintain call bell within reach at all times, provide assistance with transfer out of bed and ambulation.  Medication Inspection Compliance: Stacy Stephenson did not comply with our request to bring her pills to be counted. She was reminded that bringing the medication bottles, even when empty, is a requirement. Pill Count: No pills available to be counted today. Bottle Appearance: No container available. Did not bring bottle(s) to appointment. Medication: See above Filled Date: N/A Medication last intake:

## 2015-12-02 ENCOUNTER — Telehealth: Payer: Self-pay | Admitting: *Deleted

## 2015-12-05 ENCOUNTER — Telehealth: Payer: Self-pay | Admitting: *Deleted

## 2015-12-09 NOTE — Telephone Encounter (Signed)
Returned patient call.

## 2015-12-10 ENCOUNTER — Telehealth: Payer: Self-pay | Admitting: *Deleted

## 2015-12-10 NOTE — Telephone Encounter (Signed)
Patient called requesting that we fax office notes from 11-27-15 to [employer].

## 2015-12-11 ENCOUNTER — Telehealth: Payer: Self-pay | Admitting: Pain Medicine

## 2015-12-11 NOTE — Telephone Encounter (Signed)
[employer] called regarding patient and back to work status, they have questions about what is light duty as the patients job is sedentary ? What are the reasons she cannot return to work?   Phone [phone] Fax [phone]  Use Ref # [id number]  Also Artrice wants to speak with [facility]

## 2015-12-11 NOTE — Telephone Encounter (Signed)
Dr Dossie Arbour, I have sent your notes again to [employer] and I do not know what else to do.  There patient and the different companies are requesting information almost every day.  I have sent the forms and your notes multiple times.

## 2015-12-12 ENCOUNTER — Telehealth: Payer: Self-pay

## 2015-12-12 NOTE — Telephone Encounter (Signed)
Patient called again requesting notes to be sent. Per Dr Dossie Arbour,  Informed patient that all further records must be obtained through medical records.  Patient states understanding.

## 2016-05-06 ENCOUNTER — Telehealth: Payer: Self-pay | Admitting: *Deleted

## 2016-05-07 ENCOUNTER — Encounter: Payer: Managed Care, Other (non HMO) | Admitting: Pain Medicine

## 2016-05-21 ENCOUNTER — Other Ambulatory Visit: Payer: Self-pay | Admitting: Pain Medicine

## 2016-05-21 DIAGNOSIS — M792 Neuralgia and neuritis, unspecified: Secondary | ICD-10-CM

## 2016-05-29 ENCOUNTER — Other Ambulatory Visit: Payer: Self-pay | Admitting: Pain Medicine

## 2016-05-29 DIAGNOSIS — M62838 Other muscle spasm: Secondary | ICD-10-CM

## 2016-06-05 ENCOUNTER — Telehealth: Payer: Self-pay

## 2016-06-05 NOTE — Telephone Encounter (Signed)
Spoke with Dr Dossie Arbour and he reiterated that there will be no phone refills.  Patient has not been here since October and needs to be seen for an evaluation.  Patient notified and states she would call and schedule appointment when her finances allowed.

## 2016-06-05 NOTE — Telephone Encounter (Signed)
, °

## 2016-06-05 NOTE — Telephone Encounter (Signed)
Patient called stating that her Robaxin was not sent in with her Gabapentin on 05-25-16.  Patient has not had an appointment since October.

## 2016-07-21 ENCOUNTER — Ambulatory Visit: Payer: Self-pay | Admitting: Physician Assistant

## 2016-08-03 NOTE — Pre-Procedure Instructions (Signed)
WINDIE MARASCO  08/03/2016      [id number]ina Rabon, [place] [address] [place] [address] Phone: [phone] Fax: [phone]    Your procedure is scheduled on July 12  Report to [facility] [place].M.  Call this number if you have problems the morning of surgery:  [phone]   Remember:  Do not eat food or drink liquids after midnight.   Take these medicines the morning of surgery with A SIP OF WATER cephALEXin (KEFLEX), levothyroxine (SYNTHROID, LEVOTHROID), oxyCODONE (OXY IR/ROXICODONE) if needed,   7 days prior to surgery STOP taking any Aspirin, Aleve, Naproxen, Ibuprofen, Motrin, Advil, Goody's, BC's, all herbal medications, fish oil, and all vitamins   MAKE SURE YOU TAKE YOUR metoprolol tartrate (LOPRESSOR) THE NIGHT BEFORE SURGERY   Do not wear jewelry, make-up or nail polish.  Do not wear lotions, powders, or perfumes, or deoderant.  Do not shave 48 hours prior to surgery.    Do not bring valuables to the hospital.  [facility] is not responsible for any belongings or valuables.  Contacts, dentures or bridgework may not be worn into surgery.  Leave your suitcase in the car.  After surgery it may be brought to your room.  For patients admitted to the hospital, discharge time will be determined by your treatment team.  Patients discharged the day of surgery will not be allowed to drive home.    Special instructions:   [facility]- Preparing For Surgery  Before surgery, you can play an important role. Because skin is not sterile, your skin needs to be as free of germs as possible. You can reduce the number of germs on your skin by washing with CHG (chlorahexidine gluconate) Soap before surgery.  CHG is an antiseptic cleaner which kills germs and bonds with the skin to continue killing germs even after washing.  Please do not use if you have an allergy to CHG or antibacterial soaps. If your skin  becomes reddened/irritated stop using the CHG.  Do not shave (including legs and underarms) for at least 48 hours prior to first CHG shower. It is OK to shave your face.  Please follow these instructions carefully.   1. Shower the NIGHT BEFORE SURGERY and the MORNING OF SURGERY with CHG.   2. If you chose to wash your hair, wash your hair first as usual with your normal shampoo.  3. After you shampoo, rinse your hair and body thoroughly to remove the shampoo.  4. Use CHG as you would any other liquid soap. You can apply CHG directly to the skin and wash gently with a scrungie or a clean washcloth.   5. Apply the CHG Soap to your body ONLY FROM THE NECK DOWN.  Do not use on open wounds or open sores. Avoid contact with your eyes, ears, mouth and genitals (private parts). Wash genitals (private parts) with your normal soap.  6. Wash thoroughly, paying special attention to the area where your surgery will be performed.  7. Thoroughly rinse your body with warm water from the neck down.  8. DO NOT shower/wash with your normal soap after using and rinsing off the CHG Soap.  9. Pat yourself dry with a CLEAN TOWEL.   10. Wear CLEAN PAJAMAS   11. Place CLEAN SHEETS on your bed the night of your first shower and DO NOT SLEEP WITH PETS.    Day of Surgery: Do not apply any deodorants/lotions.  Please wear clean clothes to the hospital/surgery center.      Please read over the following fact sheets that you were given.

## 2016-08-04 ENCOUNTER — Encounter (HOSPITAL_COMMUNITY)
Admission: RE | Admit: 2016-08-04 | Discharge: 2016-08-04 | Disposition: A | Discharge status: Home / Self Care | Payer: Managed Care, Other (non HMO) | Source: Ambulatory Visit | Attending: Orthopedic Surgery | Admitting: Orthopedic Surgery

## 2016-08-04 ENCOUNTER — Encounter (HOSPITAL_COMMUNITY): Payer: Self-pay

## 2016-08-04 DIAGNOSIS — R9431 Abnormal electrocardiogram [ECG] [EKG]: Secondary | ICD-10-CM | POA: Insufficient documentation

## 2016-08-04 DIAGNOSIS — Z01810 Encounter for preprocedural cardiovascular examination: Secondary | ICD-10-CM | POA: Insufficient documentation

## 2016-08-04 DIAGNOSIS — G894 Chronic pain syndrome: Secondary | ICD-10-CM | POA: Insufficient documentation

## 2016-08-04 DIAGNOSIS — Z01812 Encounter for preprocedural laboratory examination: Secondary | ICD-10-CM | POA: Insufficient documentation

## 2016-08-04 HISTORY — DX: Family history of other specified conditions: Z84.89

## 2016-08-04 HISTORY — DX: Adverse effect of unspecified anesthetic, initial encounter: T41.45XA

## 2016-08-04 HISTORY — DX: Other complications of anesthesia, initial encounter: T88.59XA

## 2016-08-04 LAB — BASIC METABOLIC PANEL
Anion gap: 10 (ref 5–15)
BUN: 22 mg/dL — ABNORMAL HIGH (ref 6–20)
CO2: 24 mmol/L (ref 22–32)
Calcium: 9.4 mg/dL (ref 8.9–10.3)
Chloride: 102 mmol/L (ref 101–111)
Creatinine, Ser: 0.93 mg/dL (ref 0.44–1.00)
GFR calc Af Amer: >60 mL/min (ref >60)
GFR calc non Af Amer: >60 mL/min (ref >60)
Glucose, Bld: 99 mg/dL (ref 65–99)
Potassium: 2.9 mmol/L — ABNORMAL LOW (ref 3.5–5.1)
Sodium: 136 mmol/L (ref 135–145)

## 2016-08-04 LAB — SURGICAL PCR SCREEN
MRSA, PCR: NEGATIVE
Staphylococcus aureus: NEGATIVE

## 2016-08-04 LAB — CBC
HCT: 42.4 % (ref 36.0–46.0)
Hemoglobin: 14.5 g/dL (ref 12.0–15.0)
MCH: 30.3 pg (ref 26.0–34.0)
MCHC: 34.2 g/dL (ref 30.0–36.0)
MCV: 88.7 fL (ref 78.0–100.0)
Platelets: 233 10*3/uL (ref 150–400)
RBC: 4.78 MIL/uL (ref 3.87–5.11)
RDW: 12.7 % (ref 11.5–15.5)
WBC: 7.9 10*3/uL (ref 4.0–10.5)

## 2016-08-04 LAB — HCG, QUANTITATIVE, PREGNANCY: hCG, Beta Chain, Quant, S: 1911 m[IU]/mL — ABNORMAL HIGH (ref <5)

## 2016-08-04 LAB — HCG, SERUM, QUALITATIVE: Preg, Serum: POSITIVE — AB

## 2016-08-04 NOTE — Progress Notes (Addendum)
Anesthesia Chart Review:  Pt is a 41 year old female scheduled for lumbar spinal cord stimulator insertion on 08/13/2016 with Melina Schools, MD  - PCP is Tracie Harrier, MD (notes in care everywhere)  - Endocrinologist is Adella Hare, MD - OB/Gyn is Benjaman Kindler, MD  PMH includes:  HTN, hypothyroidism.  Former smoker. BMI 27.5  Anesthesia history: reports "shaking" with breast surgery in 1997 and "hard to wake up"  Medications include: Keflex, HCTZ, levothyroxine, metoprolol, potassium  Preoperative labs reviewed.   - K 2.9.  Pt reports her PCP started her on potassium 07/17/16.  I asked pt to continue taking her potassium and f/u with PCP. I will route results to PCP to make him aware.  - Positive pregnancy test.  Based on quant HCG results, pt looks to be about [redacted] weeks pregnant.  This coincides with her LMP 07/08/16.  I notified pt she will need to see OB and get clearance prior to surgery.  I left voicemail for [facility] in Dr. Rolena Infante' office.   EKG 08/04/16: NSR. Nonspecific T wave abnormality  At this point, I do not anticipate pt will be ok to proceed as scheduled.  She will need to see OB and get their input about having surgery.   Willeen Cass, FNP-BC [facility] Short Stay Surgical Center/Anesthesiology Phone: [phone] 08/04/2016 2:17 PM  Addendum:   Pt saw OB Dr. Leafy Ro 08/10/16.  Pt elected TAB, HCG now declined to 585. Dr. Leafy Ro ok'd pt for surgery.  (notes in care everywhere)   Pt reports Dr. Rolena Infante instructed her to drink coconut water and have potassium rechecked.  K was 3.3 on 08/10/16 (in care everywhere).   If no changes, I anticipate pt can proceed with surgery as scheduled.   Willeen Cass, FNP-BC [facility] Short Stay Surgical Center/Anesthesiology Phone: [phone] 08/11/2016 10:32 AM

## 2016-08-04 NOTE — Progress Notes (Signed)
PCP: Dr. Ginette Pitman @ [facility] in [place] Endocrinologist: Dr. Eddie Dibbles [facility]

## 2016-08-13 ENCOUNTER — Observation Stay (HOSPITAL_COMMUNITY)
Admission: RE | Admit: 2016-08-13 | Discharge: 2016-08-14 | Disposition: A | Discharge status: Home / Self Care | Payer: 59 | Source: Ambulatory Visit | Attending: Orthopedic Surgery | Admitting: Orthopedic Surgery

## 2016-08-13 ENCOUNTER — Encounter (HOSPITAL_COMMUNITY): Payer: Self-pay | Admitting: *Deleted

## 2016-08-13 ENCOUNTER — Inpatient Hospital Stay (HOSPITAL_COMMUNITY): Payer: 59 | Admitting: Emergency Medicine

## 2016-08-13 ENCOUNTER — Encounter (HOSPITAL_COMMUNITY)
Admission: RE | Disposition: A | Discharge status: Home / Self Care | Payer: Self-pay | Source: Ambulatory Visit | Attending: Orthopedic Surgery

## 2016-08-13 ENCOUNTER — Inpatient Hospital Stay (HOSPITAL_COMMUNITY): Payer: 59

## 2016-08-13 ENCOUNTER — Inpatient Hospital Stay (HOSPITAL_COMMUNITY): Payer: 59 | Admitting: Certified Registered"

## 2016-08-13 DIAGNOSIS — E876 Hypokalemia: Secondary | ICD-10-CM | POA: Insufficient documentation

## 2016-08-13 DIAGNOSIS — M961 Postlaminectomy syndrome, not elsewhere classified: Secondary | ICD-10-CM | POA: Insufficient documentation

## 2016-08-13 DIAGNOSIS — E039 Hypothyroidism, unspecified: Secondary | ICD-10-CM | POA: Insufficient documentation

## 2016-08-13 DIAGNOSIS — Z79899 Other long term (current) drug therapy: Secondary | ICD-10-CM | POA: Insufficient documentation

## 2016-08-13 DIAGNOSIS — Z9889 Other specified postprocedural states: Secondary | ICD-10-CM

## 2016-08-13 DIAGNOSIS — Y838 Other surgical procedures as the cause of abnormal reaction of the patient, or of later complication, without mention of misadventure at the time of the procedure: Secondary | ICD-10-CM | POA: Insufficient documentation

## 2016-08-13 DIAGNOSIS — M797 Fibromyalgia: Secondary | ICD-10-CM | POA: Insufficient documentation

## 2016-08-13 DIAGNOSIS — Z8541 Personal history of malignant neoplasm of cervix uteri: Secondary | ICD-10-CM | POA: Insufficient documentation

## 2016-08-13 DIAGNOSIS — G894 Chronic pain syndrome: Principal | ICD-10-CM | POA: Insufficient documentation

## 2016-08-13 DIAGNOSIS — G8929 Other chronic pain: Secondary | ICD-10-CM | POA: Diagnosis present

## 2016-08-13 DIAGNOSIS — Z9689 Presence of other specified functional implants: Secondary | ICD-10-CM

## 2016-08-13 DIAGNOSIS — Z419 Encounter for procedure for purposes other than remedying health state, unspecified: Secondary | ICD-10-CM

## 2016-08-13 DIAGNOSIS — K7689 Other specified diseases of liver: Secondary | ICD-10-CM | POA: Insufficient documentation

## 2016-08-13 DIAGNOSIS — I10 Essential (primary) hypertension: Secondary | ICD-10-CM | POA: Insufficient documentation

## 2016-08-13 DIAGNOSIS — Z87891 Personal history of nicotine dependence: Secondary | ICD-10-CM | POA: Insufficient documentation

## 2016-08-13 DIAGNOSIS — K5900 Constipation, unspecified: Secondary | ICD-10-CM | POA: Insufficient documentation

## 2016-08-13 HISTORY — PX: SPINAL CORD STIMULATOR INSERTION: SHX5378

## 2016-08-13 SURGERY — INSERTION, SPINAL CORD STIMULATOR, LUMBAR
Anesthesia: General | Site: Spine Thoracic

## 2016-08-13 MED ORDER — METHOCARBAMOL 500 MG PO TABS
500.0000 mg | ORAL_TABLET | Freq: Four times a day (QID) | ORAL | Status: DC | PRN
  Administered 2016-08-13 – 2016-08-14 (×3): 500 mg via ORAL
  Filled 2016-08-13 (×2): qty 1

## 2016-08-13 MED ORDER — SODIUM CHLORIDE 0.9% FLUSH: 3.0000 mL | Freq: Two times a day (BID) | INTRAVENOUS | Status: DC

## 2016-08-13 MED ORDER — CEFAZOLIN SODIUM-DEXTROSE 1-4 GM/50ML-% IV SOLN
1.0000 g | Freq: Three times a day (TID) | INTRAVENOUS | Status: AC
  Administered 2016-08-13 – 2016-08-14 (×2): 1 g via INTRAVENOUS
  Filled 2016-08-13 (×2): qty 50

## 2016-08-13 MED ORDER — ONDANSETRON HCL 4 MG/2ML IJ SOLN
INTRAMUSCULAR | Status: AC
  Filled 2016-08-13: qty 2

## 2016-08-13 MED ORDER — MENTHOL 3 MG MT LOZG: 1.0000 | LOZENGE | OROMUCOSAL | Status: DC | PRN

## 2016-08-13 MED ORDER — CLONAZEPAM 0.5 MG PO TABS: 2.0000 mg | ORAL_TABLET | Freq: Every evening | ORAL | Status: DC | PRN

## 2016-08-13 MED ORDER — ONDANSETRON HCL 4 MG/2ML IJ SOLN: 4.0000 mg | Freq: Once | INTRAMUSCULAR | Status: DC | PRN

## 2016-08-13 MED ORDER — FENTANYL CITRATE (PF) 100 MCG/2ML IJ SOLN
INTRAMUSCULAR | Status: DC | PRN
  Administered 2016-08-13: 50 ug via INTRAVENOUS
  Administered 2016-08-13: 25 ug via INTRAVENOUS
  Administered 2016-08-13 (×2): 50 ug via INTRAVENOUS
  Administered 2016-08-13: 25 ug via INTRAVENOUS
  Administered 2016-08-13: 50 ug via INTRAVENOUS

## 2016-08-13 MED ORDER — GABAPENTIN 400 MG PO CAPS
800.0000 mg | ORAL_CAPSULE | Freq: Every day | ORAL | Status: DC
  Administered 2016-08-13: 800 mg via ORAL
  Filled 2016-08-13: qty 2

## 2016-08-13 MED ORDER — CEFAZOLIN SODIUM-DEXTROSE 2-4 GM/100ML-% IV SOLN
2.0000 g | INTRAVENOUS | Status: AC
  Administered 2016-08-13: 2 g via INTRAVENOUS
  Filled 2016-08-13: qty 100

## 2016-08-13 MED ORDER — METHOCARBAMOL 1000 MG/10ML IJ SOLN
500.0000 mg | Freq: Four times a day (QID) | INTRAVENOUS | Status: DC | PRN
  Filled 2016-08-13: qty 5

## 2016-08-13 MED ORDER — FENTANYL CITRATE (PF) 250 MCG/5ML IJ SOLN
INTRAMUSCULAR | Status: AC
  Filled 2016-08-13: qty 5

## 2016-08-13 MED ORDER — OXYCODONE HCL 5 MG/5ML PO SOLN: 5.0000 mg | Freq: Once | ORAL | Status: AC | PRN

## 2016-08-13 MED ORDER — OXYCODONE HCL 5 MG PO TABS
10.0000 mg | ORAL_TABLET | ORAL | Status: DC | PRN
  Administered 2016-08-13 – 2016-08-14 (×4): 10 mg via ORAL
  Filled 2016-08-13 (×4): qty 2

## 2016-08-13 MED ORDER — MORPHINE SULFATE (PF) 4 MG/ML IV SOLN
2.0000 mg | INTRAVENOUS | Status: DC | PRN
  Administered 2016-08-13 – 2016-08-14 (×3): 2 mg via INTRAVENOUS
  Filled 2016-08-13 (×3): qty 1

## 2016-08-13 MED ORDER — OXYCODONE HCL 5 MG PO TABS
5.0000 mg | ORAL_TABLET | Freq: Once | ORAL | Status: AC | PRN
  Administered 2016-08-13: 5 mg via ORAL

## 2016-08-13 MED ORDER — HYDROCHLOROTHIAZIDE 25 MG PO TABS
25.0000 mg | ORAL_TABLET | Freq: Every day | ORAL | Status: DC
  Administered 2016-08-14: 25 mg via ORAL
  Filled 2016-08-13: qty 1

## 2016-08-13 MED ORDER — DEXAMETHASONE SODIUM PHOSPHATE 4 MG/ML IJ SOLN: 4.0000 mg | Freq: Four times a day (QID) | INTRAMUSCULAR | Status: AC

## 2016-08-13 MED ORDER — ONDANSETRON HCL 4 MG PO TABS
4.0000 mg | ORAL_TABLET | ORAL | Status: DC | PRN
  Administered 2016-08-14: 4 mg via ORAL
  Filled 2016-08-13: qty 1

## 2016-08-13 MED ORDER — OXYCODONE-ACETAMINOPHEN 10-325 MG PO TABS: 1.0000 | ORAL_TABLET | ORAL | 0 refills | Status: AC | PRN

## 2016-08-13 MED ORDER — ONDANSETRON HCL 4 MG/2ML IJ SOLN: 4.0000 mg | INTRAMUSCULAR | Status: DC | PRN

## 2016-08-13 MED ORDER — FENTANYL CITRATE (PF) 100 MCG/2ML IJ SOLN
INTRAMUSCULAR | Status: AC
  Administered 2016-08-13: 50 ug via INTRAVENOUS
  Filled 2016-08-13: qty 2

## 2016-08-13 MED ORDER — OXYCODONE HCL 5 MG PO TABS
ORAL_TABLET | ORAL | Status: AC
  Administered 2016-08-13: 5 mg via ORAL
  Filled 2016-08-13: qty 1

## 2016-08-13 MED ORDER — LACTATED RINGERS IV SOLN: INTRAVENOUS | Status: DC

## 2016-08-13 MED ORDER — SODIUM CHLORIDE 0.9% FLUSH: 3.0000 mL | INTRAVENOUS | Status: DC | PRN

## 2016-08-13 MED ORDER — DEXAMETHASONE 4 MG PO TABS
4.0000 mg | ORAL_TABLET | Freq: Four times a day (QID) | ORAL | Status: AC
  Administered 2016-08-13 – 2016-08-14 (×3): 4 mg via ORAL
  Filled 2016-08-13 (×3): qty 1

## 2016-08-13 MED ORDER — 0.9 % SODIUM CHLORIDE (POUR BTL) OPTIME
TOPICAL | Status: DC | PRN
  Administered 2016-08-13: 1000 mL

## 2016-08-13 MED ORDER — ROCURONIUM BROMIDE 50 MG/5ML IV SOLN
INTRAVENOUS | Status: AC
  Filled 2016-08-13: qty 1

## 2016-08-13 MED ORDER — METHOCARBAMOL 500 MG PO TABS
ORAL_TABLET | ORAL | Status: AC
  Administered 2016-08-13: 500 mg via ORAL
  Filled 2016-08-13: qty 1

## 2016-08-13 MED ORDER — ROCURONIUM BROMIDE 100 MG/10ML IV SOLN
INTRAVENOUS | Status: DC | PRN
  Administered 2016-08-13: 50 mg via INTRAVENOUS

## 2016-08-13 MED ORDER — PROPOFOL 10 MG/ML IV BOLUS
INTRAVENOUS | Status: AC
  Filled 2016-08-13: qty 20

## 2016-08-13 MED ORDER — HEMOSTATIC AGENTS (NO CHARGE) OPTIME
TOPICAL | Status: DC | PRN
  Administered 2016-08-13: 1 via TOPICAL

## 2016-08-13 MED ORDER — BUPIVACAINE-EPINEPHRINE 0.25% -1:200000 IJ SOLN
INTRAMUSCULAR | Status: DC | PRN
  Administered 2016-08-13: 10 mL

## 2016-08-13 MED ORDER — LEVOTHYROXINE SODIUM 100 MCG PO TABS
50.0000 ug | ORAL_TABLET | Freq: Every day | ORAL | Status: DC
  Administered 2016-08-14: 50 ug via ORAL
  Filled 2016-08-13: qty 1

## 2016-08-13 MED ORDER — LIDOCAINE HCL (CARDIAC) 20 MG/ML IV SOLN
INTRAVENOUS | Status: DC | PRN
  Administered 2016-08-13: 100 mg via INTRAVENOUS

## 2016-08-13 MED ORDER — ACETAMINOPHEN 10 MG/ML IV SOLN
1000.0000 mg | INTRAVENOUS | Status: AC
  Administered 2016-08-13: 1000 mg via INTRAVENOUS
  Filled 2016-08-13: qty 100

## 2016-08-13 MED ORDER — PROPOFOL 10 MG/ML IV BOLUS
INTRAVENOUS | Status: DC | PRN
  Administered 2016-08-13: 150 mg via INTRAVENOUS

## 2016-08-13 MED ORDER — BUPIVACAINE-EPINEPHRINE (PF) 0.25% -1:200000 IJ SOLN
INTRAMUSCULAR | Status: AC
  Filled 2016-08-13: qty 30

## 2016-08-13 MED ORDER — SUGAMMADEX SODIUM 200 MG/2ML IV SOLN
INTRAVENOUS | Status: DC | PRN
  Administered 2016-08-13: 150 mg via INTRAVENOUS

## 2016-08-13 MED ORDER — GABAPENTIN 800 MG PO TABS
800.0000 mg | ORAL_TABLET | Freq: Every day | ORAL | Status: DC
  Filled 2016-08-13: qty 1

## 2016-08-13 MED ORDER — MIDAZOLAM HCL 2 MG/2ML IJ SOLN
INTRAMUSCULAR | Status: AC
  Filled 2016-08-13: qty 2

## 2016-08-13 MED ORDER — FENTANYL CITRATE (PF) 100 MCG/2ML IJ SOLN
25.0000 ug | INTRAMUSCULAR | Status: DC | PRN
  Administered 2016-08-13 (×2): 50 ug via INTRAVENOUS

## 2016-08-13 MED ORDER — DEXAMETHASONE SODIUM PHOSPHATE 10 MG/ML IJ SOLN
INTRAMUSCULAR | Status: DC | PRN
  Administered 2016-08-13: 10 mg via INTRAVENOUS

## 2016-08-13 MED ORDER — ONDANSETRON HCL 4 MG/2ML IJ SOLN: 4.0000 mg | Freq: Four times a day (QID) | INTRAMUSCULAR | Status: DC | PRN

## 2016-08-13 MED ORDER — ONDANSETRON HCL 4 MG PO TABS: 4.0000 mg | ORAL_TABLET | Freq: Three times a day (TID) | ORAL | 0 refills | Status: AC | PRN

## 2016-08-13 MED ORDER — THROMBIN 20000 UNITS EX SOLR
CUTANEOUS | Status: AC
  Filled 2016-08-13: qty 20000

## 2016-08-13 MED ORDER — ONDANSETRON HCL 4 MG/2ML IJ SOLN
INTRAMUSCULAR | Status: DC | PRN
  Administered 2016-08-13: 4 mg via INTRAVENOUS

## 2016-08-13 MED ORDER — METOPROLOL TARTRATE 25 MG PO TABS
50.0000 mg | ORAL_TABLET | Freq: Every day | ORAL | Status: DC
  Administered 2016-08-13: 50 mg via ORAL
  Filled 2016-08-13: qty 2

## 2016-08-13 MED ORDER — LACTATED RINGERS IV SOLN
INTRAVENOUS | Status: DC
  Administered 2016-08-13 (×2): via INTRAVENOUS

## 2016-08-13 MED ORDER — PHENOL 1.4 % MT LIQD: 1.0000 | OROMUCOSAL | Status: DC | PRN

## 2016-08-13 MED ORDER — LIDOCAINE HCL (CARDIAC) 20 MG/ML IV SOLN
INTRAVENOUS | Status: AC
  Filled 2016-08-13: qty 5

## 2016-08-13 MED ORDER — DEXAMETHASONE SODIUM PHOSPHATE 10 MG/ML IJ SOLN
INTRAMUSCULAR | Status: AC
  Filled 2016-08-13: qty 1

## 2016-08-13 MED ORDER — ONDANSETRON HCL 4 MG PO TABS
4.0000 mg | ORAL_TABLET | Freq: Four times a day (QID) | ORAL | Status: DC | PRN
  Administered 2016-08-13: 4 mg via ORAL
  Filled 2016-08-13: qty 1

## 2016-08-13 MED ORDER — MIDAZOLAM HCL 5 MG/5ML IJ SOLN
INTRAMUSCULAR | Status: DC | PRN
  Administered 2016-08-13: 2 mg via INTRAVENOUS

## 2016-08-13 MED ORDER — METHOCARBAMOL 500 MG PO TABS: 500.0000 mg | ORAL_TABLET | Freq: Three times a day (TID) | ORAL | 0 refills | Status: AC | PRN

## 2016-08-13 SURGICAL SUPPLY — 54 items
CANISTER SUCT 3000ML PPV (MISCELLANEOUS) ×2 IMPLANT
CLSR STERI-STRIP ANTIMIC 1/2X4 (GAUZE/BANDAGES/DRESSINGS) ×2 IMPLANT
CONTROLLER PROCLAIM IPG E5 PAT (Neuro Prosthesis/Implant) ×2 IMPLANT
COVER PROBE W GEL 5X96 (DRAPES) ×2 IMPLANT
COVER SURGICAL LIGHT HANDLE (MISCELLANEOUS) ×2 IMPLANT
DRAPE C-ARM 42X72 X-RAY (DRAPES) ×2 IMPLANT
DRAPE INCISE IOBAN 85X60 (DRAPES) ×2 IMPLANT
DRAPE SURG 17X23 STRL (DRAPES) ×2 IMPLANT
DRAPE U-SHAPE 47X51 STRL (DRAPES) ×2 IMPLANT
DRSG AQUACEL AG ADV 3.5X 6 (GAUZE/BANDAGES/DRESSINGS) IMPLANT
DRSG OPSITE POSTOP 4X6 (GAUZE/BANDAGES/DRESSINGS) ×4 IMPLANT
DURAPREP 26ML APPLICATOR (WOUND CARE) ×2 IMPLANT
ELECT BLADE 4.0 EZ CLEAN MEGAD (MISCELLANEOUS) ×2
ELECT CAUTERY BLADE 6.4 (BLADE) IMPLANT
ELECT PENCIL ROCKER SW 15FT (MISCELLANEOUS) ×2 IMPLANT
ELECT REM PT RETURN 9FT ADLT (ELECTROSURGICAL) ×2
ELECTRODE BLDE 4.0 EZ CLN MEGD (MISCELLANEOUS) ×1 IMPLANT
ELECTRODE REM PT RTRN 9FT ADLT (ELECTROSURGICAL) ×1 IMPLANT
GLOVE BIO SURGEON STRL SZ7 (GLOVE) ×2 IMPLANT
GLOVE BIOGEL PI IND STRL 7.0 (GLOVE) ×1 IMPLANT
GLOVE BIOGEL PI IND STRL 8.5 (GLOVE) ×1 IMPLANT
GLOVE BIOGEL PI INDICATOR 7.0 (GLOVE) ×1
GLOVE BIOGEL PI INDICATOR 8.5 (GLOVE) ×1
GLOVE SS N UNI LF 8.5 STRL (GLOVE) ×4 IMPLANT
GOWN STRL REUS W/ TWL LRG LVL3 (GOWN DISPOSABLE) ×2 IMPLANT
GOWN STRL REUS W/TWL 2XL LVL3 (GOWN DISPOSABLE) ×2 IMPLANT
GOWN STRL REUS W/TWL LRG LVL3 (GOWN DISPOSABLE) ×2
KIT BASIN OR (CUSTOM PROCEDURE TRAY) ×2 IMPLANT
KIT ROOM TURNOVER OR (KITS) ×2 IMPLANT
LEAD LAMITRODE 60CM 8CH (Lead) ×2 IMPLANT
NEEDLE 22X1 1/2 (OR ONLY) (NEEDLE) ×2 IMPLANT
NEEDLE SPNL 18GX3.5 QUINCKE PK (NEEDLE) ×2 IMPLANT
NS IRRIG 1000ML POUR BTL (IV SOLUTION) ×2 IMPLANT
PACK LAMINECTOMY ORTHO (CUSTOM PROCEDURE TRAY) ×2 IMPLANT
PACK UNIVERSAL I (CUSTOM PROCEDURE TRAY) ×2 IMPLANT
PAD ARMBOARD 7.5X6 YLW CONV (MISCELLANEOUS) ×4 IMPLANT
SPATULA SILICONE BRAIN 10MM (MISCELLANEOUS) ×2 IMPLANT
SPONGE LAP 4X18 X RAY DECT (DISPOSABLE) IMPLANT
SPONGE SURGIFOAM ABS GEL 100 (HEMOSTASIS) IMPLANT
STAPLER VISISTAT 35W (STAPLE) IMPLANT
SURGIFLO W/THROMBIN 8M KIT (HEMOSTASIS) ×2 IMPLANT
SUT BONE WAX W31G (SUTURE) ×2 IMPLANT
SUT ETHIBOND 2 OS 4 DA (SUTURE) IMPLANT
SUT ETHIBOND NAB CT1 #1 30IN (SUTURE) ×4 IMPLANT
SUT MNCRL AB 3-0 PS2 18 (SUTURE) ×4 IMPLANT
SUT VIC AB 1 CT1 18XCR BRD 8 (SUTURE) ×2 IMPLANT
SUT VIC AB 1 CT1 8-18 (SUTURE) ×4
SUT VIC AB 2-0 CT1 18 (SUTURE) ×4 IMPLANT
SYR BULB IRRIGATION 50ML (SYRINGE) ×2 IMPLANT
SYR CONTROL 10ML LL (SYRINGE) ×2 IMPLANT
TOOL TUNNELING 20 (MISCELLANEOUS) ×2 IMPLANT
TOWEL OR 17X24 6PK STRL BLUE (TOWEL DISPOSABLE) IMPLANT
TOWEL OR 17X26 10 PK STRL BLUE (TOWEL DISPOSABLE) IMPLANT
WATER STERILE IRR 1000ML POUR (IV SOLUTION) IMPLANT

## 2016-08-13 NOTE — Op Note (Signed)
Operative note.  Preoperative diagnosis. Chronic pain syndrome.  Postoperative diagnosis. Same.  Operative procedure. Implantation of spinal cord stimulator.  First Environmental consultant. Stacy Stephenson female.  Intraoperative findings implant to [employer]'s Lamitrode S8 paddle and the proclaimed 5 nonrechargeable battery.  Complications. None.  Indications. 41 year old woman who's had chronic debilitating back buttock and bilateral leg pain. Failed to improve with conservative measures. Ultimately had a spinal cord stimulator trial by Dr. Prudy Feeler and had relief of pain. As a result of the successful trial she was referred to me for permanent implantation. All appropriate risks benefits and alternatives were discussed with the patient. Consent was obtained.  Operative note. Patient was brought the operating room placed on the operating table. After successful induction of general anesthesia and endotracheal intubation teds SCDs were applied and she was turned prone onto the Wilson frame. All bony prominences were well-padded and the back was prepped and draped in a standard fashion. Timeout was then taken to confirm patient procedure and all other pertinent important data. Once this was done I began the surgery.  Using AP and lateral fluoroscopy I identified the T12 vertebral body and then ultimately the T9 vertebral body. According to the spinal cord rep maximum coverage was obtained by programming at the T8 vertebral body. I elected to perform a T9 laminotomy. The incision site was mapped out and infiltrated with quarter percent Marcaine. Incision was made and sharp dissection was carried out down to the level of the deep fascia. Deep fascia was sharply incised and I stripped the paraspinal muscles to expose the inferior portion of T9 and all of T10. I then took another set of x-rays to confirm that T9 pedicle and the T9 spinous process. Once this was done double-action Leksell rongeur was used to remove the posterior  elements at T9. These to 2 mm Kerrison to perform a laminotomy of T9. I then used my Penfield 4 to dissect through the ligamentum flavum and again used to Kerrison punch to remove the central portion of the ligamentum flavum. At this point the dorsum of the thecal sac was now exposed. The patient had a narrow canal volume and so I elected to use the narrow were paddle. The Lamitrode S8 paddle was obtained and then advanced. Under fluoroscopy guidance I took final x-rays. Ice band the T8 T7 vertebral bodies. The implant itself point without any significant difficulty. AP and lateral pictures confirm satisfactory position focal paddle.  At this point I secured the paddle directly to the T10 spinous process using a #1 Ethibond suture. I then passed the wire through the T 10-11 interspinous process ligament and wrapped around the T10 spinous process for greater security. A second incision was made in the left gluteal region and blunt dissection was carried out down to the deep fascia I then created a pocket for the battery. Using the submuscular passing advanced th the paddle lead from the thoracic down to the gluteal wound. The lead was then connected and secured to the battery. According to the rep he was adequately functioning. With this placed I then sutured the battery the deep fascia with 2 #1 Vicryl sutures. At this point all wounds were copiously irrigated with normal saline. Hemostasis was obtained using bipolar cautery and FloSeal. Once the final irrigation was complete I confirmed hemostasis and closed both wounds in a layered fashion with interrupted #1 Vicryl sutures and 2-0 Vicryl sutures and 3-0 Monocryl. Steri-Strips dry dressings were applied and the patient was ultimately extubated transferred the PACU  that incident. The end of the case all needle sponge counts were correct. There were no adverse intraoperative events.

## 2016-08-13 NOTE — Anesthesia Preprocedure Evaluation (Addendum)
Anesthesia Evaluation  Patient identified by MRN, date of birth, ID band Patient awake    Reviewed: Allergy & Precautions, NPO status , Patient's Chart, lab work & pertinent test results  Airway Mallampati: II   Neck ROM: Full    Dental  (+) Teeth Intact, Dental Advisory Given   Pulmonary former smoker,    breath sounds clear to auscultation       Cardiovascular hypertension, Pt. on medications  Rhythm:Regular Rate:Normal     Neuro/Psych    GI/Hepatic   Endo/Other    Renal/GU      Musculoskeletal   Abdominal   Peds  Hematology   Anesthesia Other Findings   Reproductive/Obstetrics                            Anesthesia Physical Anesthesia Plan  ASA: III  Anesthesia Plan: General   Post-op Pain Management:    Induction: Intravenous  PONV Risk Score and Plan: Ondansetron and Dexamethasone  Airway Management Planned: Oral ETT  Additional Equipment:   Intra-op Plan:   Post-operative Plan: Extubation in OR  Informed Consent: I have reviewed the patients History and Physical, chart, labs and discussed the procedure including the risks, benefits and alternatives for the proposed anesthesia with the patient or authorized representative who has indicated his/her understanding and acceptance.   Dental advisory given  Plan Discussed with: CRNA and Anesthesiologist  Anesthesia Plan Comments:         Anesthesia Quick Evaluation

## 2016-08-13 NOTE — H&P (Signed)
History of Present Illness  The patient is a 41 year old female who comes in today for a preoperative History and Physical. The patient is scheduled for a SCS placement to be performed by Dr. Duane Lope D. Rolena Infante, MD at [facility] on 08-13-16 . Please see the hospital record for complete dictated history and physical. Pt has a hx of Hypothyroidism. the pt has currently been told she is hypokalemic.  Problem List/Past Medical  Chronic pain syndrome (G89.4)  Lumbar post-laminectomy syndrome (M96.1)  Problems Reconciled   Allergies  No Known Drug Allergies  Allergies Reconciled   Family History  Cancer  Maternal Grandfather, Mother. Cerebrovascular Accident  Father. Congestive Heart Failure  Father, Paternal Grandfather. Diabetes Mellitus  Father. First Degree Relatives  reported Heart Disease  Father, Paternal Grandfather. Heart disease in female family member before age 49  Hypertension  Father, Paternal Grandfather.  Social History  Children  0 Current drinker  03/23/2016: Currently drinks wine only occasionally per week Exercise  Exercises rarely Living situation  live alone No history of drug/alcohol rehab  Not under pain contract  Number of flights of stairs before winded  4-5 Tobacco / smoke exposure  03/23/2016: yes outdoors only Tobacco use  Former smoker. 03/23/2016: smoke(d) 1 pack(s) per day  Medication History  ClonazePAM (2MG  Tablet, Oral) Active. Metoprolol Tartrate (50MG  Tablet, Oral) Active. HydroCHLOROthiazide (25MG  Tablet, Oral) Active. Gabapentin (800MG  Tablet, Oral) Active. OxyCODONE HCl (5MG  Tablet, Oral) Active. Vitamin D (Ergocalciferol) (50000UNIT Capsule, Oral) Active. Multiple Vitamin (Oral) Active. Zinc (Oral) Specific strength unknown - Active. Magnesium (500MG  Tablet, Oral) Active. Biotin (10000MCG Tablet, Oral) Active. Levothyroxine Sodium (50MCG Tablet, Oral) Active. Potassium (Oral) Specific strength  unknown - Active. Vitamin B Complex (Oral) Active. Medications Reconciled  Past Surgical History Mammoplasty; Reduction  bilateral Other Orthopaedic Surgery  Spinal Surgery   Other Problems  Cancer  Chronic Pain  Fibromyalgia  High blood pressure  Hypothyroidism  Migraine Headache  Other disease, cancer, significant illness   Vitals 08/04/2016 1:43 PM Weight: 147 lb Height: 62in Body Surface Area: 1.68 m Body Mass Index: 26.89 kg/m  Temp.: 98.20F(Oral)  Pulse: 77 (Regular)  BP: 119/79 (Sitting, Right Arm, Standard)  General General Appearance-Not in acute distress. Orientation-Oriented X3. Build & Nutrition-Well nourished and Well developed.  Integumentary General Characteristics Surgical Scars - no surgical scar evidence of previous lumbar surgery. Lumbar Spine-Skin examination of the lumbar spine is without deformity, skin lesions, lacerations or abrasions.  Chest and Lung Exam Auscultation Breath sounds - Normal and Clear.  Cardiovascular Auscultation Rhythm - Regular rate and rhythm.  Abdomen Palpation/Percussion Palpation and Percussion of the abdomen reveal - Soft, Non Tender and No Rebound tenderness.  Peripheral Vascular Lower Extremity Palpation - Posterior tibial pulse - Bilateral - 2+. Dorsalis pedis pulse - Bilateral - 2+.  Neurologic Sensation Lower Extremity - Bilateral - sensation is intact in the lower extremity. Reflexes Patellar Reflex - Bilateral - 2+. Achilles Reflex - Bilateral - 2+. Clonus - Bilateral - clonus not present. Hoffman's Sign - Bilateral - Hoffman's sign not present. Testing Seated Straight Leg Raise - Bilateral - Seated straight leg raise negative.  Musculoskeletal Spine/Ribs/Pelvis  Lumbosacral Spine: Inspection and Palpation - Tenderness - left lumbar paraspinals tender to palpation and right lumbar paraspinals tender to palpation. Strength and Tone: Strength - Hip Flexion - Bilateral  - 5/5. Knee Extension - Bilateral - 5/5. Knee Flexion - Bilateral - 5/5. Ankle Dorsiflexion - Bilateral - 5/5. Ankle Plantarflexion - Bilateral - 5/5. ROM -  Flexion - moderately decreased range of motion and painful. Extension - moderately decreased range of motion and painful. Left Lateral Bending - moderately decreased range of motion and painful. Right Lateral Bending - moderately decreased range of motion and painful. Right Rotation - moderately decreased range of motion and painful. Left Rotation - moderately decreased range of motion and painful. Pain - neither flexion or extension is more painful than the other. Lumbosacral Spine - Waddell's Signs - no Waddell's signs present. Lower Extremity Range of Motion - No true hip, knee or ankle pain with range of motion. Gait and Station - [employer] - no assistive devices.  IMAGING MRI of the thoracic spine done on 02/28/2016 demonstrates no contraindication for inserting the paddle. It did mention though there is a 1 x 1.6 cm lesion in the posterior right hepatic lobe. 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 But it has been incompletely visualized.  Plan. Patient has spoken with her primary care physician and he will monitor the hepatic cyst. Patient has been cleared for the implantation of spinal cord stimulator. She had an excellent response with the spinal cord stimulator trial and for the first time in three years, her pain was significantly less and her quality of life was improved. We have gone over the risks of surgery, which include infection, bleeding, nerve damage, death, stroke, paralysis, ongoing or worse pain, migration of the lead, hardware failure, need to replace the battery. All of her questions were encouraged and addressed. She is present for the dictation.

## 2016-08-13 NOTE — Anesthesia Procedure Notes (Addendum)
Procedure Name: Intubation Date/Time: 08/13/2016 1:01 PM Performed by: Jenne Campus Pre-anesthesia Checklist: Patient identified, Emergency Drugs available, Suction available and Patient being monitored Patient Re-evaluated:Patient Re-evaluated prior to induction Oxygen Delivery Method: Circle System Utilized Preoxygenation: Pre-oxygenation with 100% oxygen Induction Type: IV induction Ventilation: Mask ventilation without difficulty Laryngoscope Size: Miller and 2 Grade View: Grade I Tube type: Oral Tube size: 7.0 mm Number of attempts: 1 Airway Equipment and Method: Stylet Placement Confirmation: ETT inserted through vocal cords under direct vision,  positive ETCO2 and breath sounds checked- equal and bilateral Secured at: 22 cm Tube secured with: Tape Dental Injury: Teeth and Oropharynx as per pre-operative assessment

## 2016-08-13 NOTE — Transfer of Care (Signed)
Immediate Anesthesia Transfer of Care Note  Patient: Stacy Stephenson  Procedure(s) Performed: Procedure(s) with comments: LUMBAR SPINAL CORD STIMULATOR INSERTION (N/A) - 150 mins  Patient Location: PACU  Anesthesia Type:General  Level of Consciousness: awake, oriented and patient cooperative  Airway & Oxygen Therapy: Patient Spontanous Breathing and Patient connected to nasal cannula oxygen  Post-op Assessment: Report given to RN and Post -op Vital signs reviewed and stable  Post vital signs: Reviewed  Last Vitals:  Vitals:   08/13/16 0932  BP: 115/68  Pulse: 79  Resp: 20  Temp: 36.9 C    Last Pain:  Vitals:   08/13/16 1003  TempSrc:   PainSc: 6       Patients Stated Pain Goal: 4 (54/49/20 1007)  Complications: No apparent anesthesia complications

## 2016-08-13 NOTE — Discharge Instructions (Signed)
Ok to shower in 5 days Keep dressings clean and dry No strenuous activity

## 2016-08-13 NOTE — Brief Op Note (Signed)
Thanks 08/13/2016  2:33 PM  PATIENT:  Stacy Stephenson  41 y.o. female  PRE-OPERATIVE DIAGNOSIS:  Chronic pain syndrome  POST-OPERATIVE DIAGNOSIS:  Chronic pain syndrome  PROCEDURE:  Procedure(s) with comments: LUMBAR SPINAL CORD STIMULATOR INSERTION (N/A) - 150 mins  SURGEON:  Surgeon(s) and Role:    Melina Schools, MD - Primary  PHYSICIAN ASSISTANT:   ASSISTANTS: carmen mayo   ANESTHESIA:   general  EBL:  Total I/O In: 1000 [I.V.:1000] Out: -   BLOOD ADMINISTERED:none  DRAINS: none   LOCAL MEDICATIONS USED:  MARCAINE     SPECIMEN:  No Specimen  DISPOSITION OF SPECIMEN:  N/A  COUNTS:  YES  TOURNIQUET:  * No tourniquets in log *  DICTATION: .Dragon Dictation  PLAN OF CARE: Admit for overnight observation  PATIENT DISPOSITION:  PACU - hemodynamically stable.

## 2016-08-14 ENCOUNTER — Encounter (HOSPITAL_COMMUNITY): Payer: Self-pay | Admitting: Orthopedic Surgery

## 2016-08-14 MED ORDER — MAGNESIUM CITRATE PO SOLN
0.5000 | Freq: Once | ORAL | Status: AC
  Administered 2016-08-14: 0.5 via ORAL
  Filled 2016-08-14: qty 296

## 2016-08-14 MED ORDER — MAGNESIUM CITRATE PO SOLN: 1.0000 | Freq: Once | ORAL | Status: DC

## 2016-08-14 NOTE — Progress Notes (Signed)
    Subjective: Procedure(s) (LRB): LUMBAR SPINAL CORD STIMULATOR INSERTION (N/A) 1 Day Post-Op  Patient reports pain as 2 on 0-10 scale.  Reports decreased leg pain reports incisional back pain   Positive void Negative bowel movement Negative flatus Positive chest pain or shortness of breath  Objective: Vital signs in last 24 hours: Temp:  [97.2 F (36.2 C)-98.5 F (36.9 C)] 98.5 F (36.9 C) (07/13 0400) Pulse Rate:  [61-95] 75 (07/13 0400) Resp:  [0-20] 20 (07/13 0400) BP: (100-124)/(63-80) 100/67 (07/13 0400) SpO2:  [97 %-100 %] 97 % (07/13 0400) Weight:  [68 kg (150 lb)] 68 kg (150 lb) (07/12 1003)  Intake/Output from previous day: 07/12 0701 - 07/13 0700 In: 1300 [I.V.:1300] Out: 50 [Blood:50]  Labs: No results for input(s): WBC, RBC, HCT, PLT in the last 72 hours. No results for input(s): NA, K, CL, CO2, BUN, CREATININE, GLUCOSE, CALCIUM in the last 72 hours. No results for input(s): LABPT, INR in the last 72 hours.  Physical Exam: Neurologically intact ABD soft Intact pulses distally Dorsiflexion/Plantar flexion intact Incision: dressing C/D/I Compartment soft  Assessment/Plan: Patient stable  xrays n/a Continue mobilization with physical therapy Continue care  Advance diet Up with therapy  Mag Citrate for constipation If flatus/BM then ok for d/c later today  Melina Schools, MD [place] [phone]

## 2016-08-14 NOTE — Progress Notes (Signed)
Occupational Therapy Evaluation Patient Details Name: Stacy Stephenson MRN: [id number] DOB: [birth year] Today's Date: 08/14/2016    History of Present Illness Pt is a 41 y/o female who presents s/p lumbar spinal cord stimulator insertion.    Clinical Impression   Completed all information regarding compensatory techniques for ADL and management of self care with available AE to reduce pain. Pt will benefit from use of 3 in1 as shower chair. Pt verbalized understanding and very appreciative. Written information reviewed. Pt safe to DC home with intermittent S when medically stable.     Follow Up Recommendations  No OT follow up;Supervision - Intermittent    Equipment Recommendations  3 in 1 bedside commode    Recommendations for Other Services       Precautions / Restrictions Precautions Precautions: Fall;Back Precaution Booklet Issued: Yes (comment) Precaution Comments: Reviewed handout in detail. Pt was cued for precautions during functional mobility.  Required Braces or Orthoses:  ("No brace needed" order) Restrictions Weight Bearing Restrictions: No      Mobility Bed Mobility Overal bed mobility: Needs Assistance Bed Mobility: Rolling;Sidelying to Sit Rolling: Modified independent (Device/Increase time) Sidelying to sit: Modified independent (Device/Increase time)       General bed mobility comments: OOB in chair  Transfers Overall transfer level: Needs assistance   Transfers: Sit to/from Stand Sit to Stand: Supervision         General transfer comment: appears limited by pan but able to complete    Balance Overall balance assessment: Needs assistance Sitting-balance support: Feet supported;No upper extremity supported Sitting balance-Leahy Scale: Good     Standing balance support: No upper extremity supported;During functional activity Standing balance-Leahy Scale: Fair                             ADL either performed or assessed with  clinical judgement   ADL Overall ADL's : Needs assistance/impaired                                  Pt very anxious about pericare and had concerns regarding difficulty with caring for herself prior to surgery. Discussed options and use of compensatory strategies and AE. Pt very appreciative.    Functional mobility during ADLs: Supervision/safety General ADL Comments: Completed education regarding back precautions, Compensatory techqnies and ADL management with use of AE as needed. . Pt staes she plans to get a reacher. Educated on home safety and reducing risk of falls. Recommend use of 3in1 for bathing. Demonstrated shower transfer technique. Pt able to return demonstrate.      Vision         Perception     Praxis      Pertinent Vitals/Pain Pain Assessment: 0-10 Pain Score: 6  Faces Pain Scale: Hurts even more Pain Location: Incision site Pain Descriptors / Indicators: Operative site guarding;Discomfort Pain Intervention(s): Limited activity within patient's tolerance;Repositioned     Hand Dominance Right   Extremity/Trunk Assessment Upper Extremity Assessment Upper Extremity Assessment: Overall WFL for tasks assessed   Lower Extremity Assessment Lower Extremity Assessment: Overall WFL for tasks assessed   Cervical / Trunk Assessment Cervical / Trunk Assessment: Other exceptions Cervical / Trunk Exceptions: s/p surgery   Communication Communication Communication: No difficulties   Cognition Arousal/Alertness: Awake/alert Behavior During Therapy: WFL for tasks assessed/performed Overall Cognitive Status: Within Functional Limits for tasks assessed  General Comments       Exercises     Shoulder Instructions      Home Living Family/patient expects to be discharged to:: Private residence Living Arrangements: Spouse/significant other Available Help at Discharge: Family;Available 24 hours/day  (Husband out of town; God sister will be available 24 hours) Type of Home: House Home Access: Level entry     Home Layout: Two level;Able to live on main level with bedroom/bathroom Alternate Level Stairs-Number of Steps: Flight   Bathroom Shower/Tub: Walk-in shower;Tub/shower unit   Armed forces training and education officer: Yes   Home Equipment: Cane - single point   Additional Comments: tub shower upstairs, walk-in shower on main level where bedroom is      Prior Functioning/Environment Level of Independence: Independent                 OT Problem List: Decreased knowledge of use of DME or AE;Decreased knowledge of precautions;Pain      OT Treatment/Interventions:      OT Goals(Current goals can be found in the care plan section) Acute Rehab OT Goals Patient Stated Goal: Home today OT Goal Formulation: All assessment and education complete, DC therapy  OT Frequency:     Barriers to D/C:            Co-evaluation              AM-PAC PT "6 Clicks" Daily Activity     Outcome Measure Help from another person eating meals?: None Help from another person taking care of personal grooming?: None Help from another person toileting, which includes using toliet, bedpan, or urinal?: A Little Help from another person bathing (including washing, rinsing, drying)?: A Little Help from another person to put on and taking off regular upper body clothing?: None Help from another person to put on and taking off regular lower body clothing?: A Little 6 Click Score: 21   End of Session Nurse Communication: Other (comment) (DC  needs)  Activity Tolerance: Patient tolerated treatment well Patient left: in chair;with call bell/phone within reach  OT Visit Diagnosis: Pain Pain - part of body:  (back)                Time: 3734-2876 OT Time Calculation (min): 20 min Charges:  OT General Charges $OT Visit: 1 Procedure OT Evaluation $OT Eval Low Complexity: 1  Procedure G-Codes: OT G-codes **NOT FOR INPATIENT CLASS** Functional Assessment Tool Used: Clinical judgement Functional Limitation: Self care Self Care Current Status (O1157): At least 1 percent but less than 20 percent impaired, limited or restricted Self Care Goal Status (W6203): At least 1 percent but less than 20 percent impaired, limited or restricted Self Care Discharge Status [phone]): At least 1 percent but less than 20 percent impaired, limited or restricted   [facility], OT/L  [phone] 08/14/2016  Ka Bench,HILLARY 08/14/2016, 10:52 AM

## 2016-08-14 NOTE — Anesthesia Postprocedure Evaluation (Signed)
Anesthesia Post Note  Patient: Stacy Stephenson  Procedure(s) Performed: Procedure(s) (LRB): LUMBAR SPINAL CORD STIMULATOR INSERTION (N/A)     Patient location during evaluation: PACU Anesthesia Type: General Level of consciousness: awake, awake and alert and oriented Pain management: pain level controlled Vital Signs Assessment: post-procedure vital signs reviewed and stable Respiratory status: spontaneous breathing, nonlabored ventilation and respiratory function stable Cardiovascular status: blood pressure returned to baseline Anesthetic complications: no    Last Vitals:  Vitals:   08/14/16 0400 08/14/16 0836  BP: 100/67 114/79  Pulse: 75 72  Resp: 20 20  Temp: 36.9 C (!) 36.4 C    Last Pain:  Vitals:   08/14/16 0836  TempSrc: Oral  PainSc:                  Jaree Dwight COKER

## 2016-08-14 NOTE — Progress Notes (Signed)
Patient alert and oriented, mae's well, voiding adequate amount of urine, swallowing without difficulty, c/o moderate pain and meds given prior to discharged for ride and discomfort. Patient discharged home with family. Script and discharged instructions given to patient. Patient and family stated understanding of instructions given.   

## 2016-08-14 NOTE — Evaluation (Signed)
Physical Therapy Evaluation Patient Details Name: Stacy Stephenson MRN: [id number] DOB: 1975-06-29 Today's Date: 08/14/2016   History of Present Illness  Pt is a 41 y/o female who presents s/p lumbar spinal cord stimulator insertion.   Clinical Impression  Pt admitted with above diagnosis. Pt currently with functional limitations due to the deficits listed below (see PT Problem List). At the time of PT eval pt was able to perform transfers and ambulation with gross supervision for safety. Pt will benefit from skilled PT to increase their independence and safety with mobility to allow discharge to the venue listed below.       Follow Up Recommendations No PT follow up;Supervision for mobility/OOB    Equipment Recommendations  3in1 (PT)    Recommendations for Other Services       Precautions / Restrictions Precautions Precautions: Fall;Back Precaution Booklet Issued: Yes (comment) Precaution Comments: Reviewed handout in detail. Pt was cued for precautions during functional mobility.  Required Braces or Orthoses:  ("No brace needed" order) Restrictions Weight Bearing Restrictions: No      Mobility  Bed Mobility Overal bed mobility: Needs Assistance Bed Mobility: Rolling;Sidelying to Sit Rolling: Modified independent (Device/Increase time) Sidelying to sit: Modified independent (Device/Increase time)       General bed mobility comments: Pt was able to transition to EOB with HOB flat and rails lowered to simulate home environment. Pt recalls logroll from prior surgery.   Transfers Overall transfer level: Needs assistance   Transfers: Sit to/from Stand Sit to Stand: Supervision         General transfer comment: Close supervision for safety as pt powered up to full standing position.   Ambulation/Gait Ambulation/Gait assistance: Supervision Ambulation Distance (Feet): 250 Feet Assistive device: None Gait Pattern/deviations: Step-through pattern;Decreased stride  length Gait velocity: Decreased Gait velocity interpretation: Below normal speed for age/gender General Gait Details: Slow guarded gait 2 pain. Close supervision for safety however no assist required for balance support.   Stairs            Wheelchair Mobility    Modified Rankin (Stroke Patients Only)       Balance Overall balance assessment: Needs assistance Sitting-balance support: Feet supported;No upper extremity supported Sitting balance-Leahy Scale: Good     Standing balance support: No upper extremity supported;During functional activity Standing balance-Leahy Scale: Fair                               Pertinent Vitals/Pain Pain Assessment: Faces Faces Pain Scale: Hurts even more Pain Location: Incision site Pain Descriptors / Indicators: Operative site guarding;Discomfort Pain Intervention(s): Limited activity within patient's tolerance;Monitored during session;Repositioned    Home Living Family/patient expects to be discharged to:: Private residence Living Arrangements: Spouse/significant other Available Help at Discharge: Family;Available 24 hours/day (Husband out of town; God sister will be available 24 hours) Type of Home: House Home Access: Level entry     Home Layout: Two level;Able to live on main level with bedroom/bathroom Home Equipment: Kasandra Knudsen - single point Additional Comments: tub shower upstairs, walk-in shower on main level where bedroom is    Prior Function Level of Independence: Independent               Hand Dominance        Extremity/Trunk Assessment   Upper Extremity Assessment Upper Extremity Assessment: Defer to OT evaluation    Lower Extremity Assessment Lower Extremity Assessment: Overall WFL for tasks assessed (Weakness consistent with  acute pain and pre-op diagnosis)    Cervical / Trunk Assessment Cervical / Trunk Assessment: Other exceptions Cervical / Trunk Exceptions: s/p surgery  Communication    Communication: No difficulties  Cognition Arousal/Alertness: Awake/alert Behavior During Therapy: WFL for tasks assessed/performed Overall Cognitive Status: Within Functional Limits for tasks assessed                                        General Comments      Exercises     Assessment/Plan    PT Assessment Patient needs continued PT services  PT Problem List Decreased strength;Decreased range of motion;Decreased activity tolerance;Decreased balance;Decreased mobility;Decreased knowledge of use of DME;Decreased safety awareness;Decreased knowledge of precautions;Pain       PT Treatment Interventions DME instruction;Gait training;Stair training;Functional mobility training;Therapeutic activities;Therapeutic exercise;Neuromuscular re-education;Patient/family education    PT Goals (Current goals can be found in the Care Plan section)  Acute Rehab PT Goals Patient Stated Goal: Home today PT Goal Formulation: With patient Time For Goal Achievement: 08/21/16 Potential to Achieve Goals: Good    Frequency Min 5X/week   Barriers to discharge        Co-evaluation               AM-PAC PT "6 Clicks" Daily Activity  Outcome Measure Difficulty turning over in bed (including adjusting bedclothes, sheets and blankets)?: None Difficulty moving from lying on back to sitting on the side of the bed? : None Difficulty sitting down on and standing up from a chair with arms (e.g., wheelchair, bedside commode, etc,.)?: None Help needed moving to and from a bed to chair (including a wheelchair)?: None Help needed walking in hospital room?: None Help needed climbing 3-5 steps with a railing? : A Little 6 Click Score: 23    End of Session   Activity Tolerance: Patient tolerated treatment well Patient left: in chair;with call bell/phone within reach Nurse Communication: Mobility status PT Visit Diagnosis: Unsteadiness on feet (R26.81);Pain Pain - part of body:   (back)    Time: 1540-0867 PT Time Calculation (min) (ACUTE ONLY): 28 min   Charges:   PT Evaluation $PT Eval Moderate Complexity: 1 Procedure PT Treatments $Gait Training: 8-22 mins   PT G Codes:   PT G-Codes **NOT FOR INPATIENT CLASS** Functional Assessment Tool Used: Clinical judgement Functional Limitation: Mobility: Walking and moving around Mobility: Walking and Moving Around Current Status (Y1950): At least 1 percent but less than 20 percent impaired, limited or restricted Mobility: Walking and Moving Around Goal Status [phone]): 0 percent impaired, limited or restricted    Rolinda Roan, PT, DPT Acute Rehabilitation Services Pager: [place] 08/14/2016, 9:23 AM

## 2016-08-23 NOTE — Discharge Summary (Signed)
Physician Discharge Summary  Patient ID: Stacy Stephenson MRN: [id number] DOB/AGE: [birth year] 41 y.o.  Admit date: 08/13/2016 Discharge date: 08/14/16  Admission Diagnoses:  Chronic Pain Syndrome  Discharge Diagnoses:  Active Problems:   Status post insertion of spinal cord stimulator   Chronic pain   Past Medical History:  Diagnosis Date  . Cancer (HCC)    cervical  . Complication of anesthesia    shaking ( breast surgery) hard to wake up with both surgeries  . Depression    history of back in 2000-last her father  . Family history of adverse reaction to anesthesia    Aunt (dad's sister had same problems)  . Fibromyalgia   . Hypertension   . Hypothyroidism   . Migraines    menstrual but not every month    Surgeries: Procedure(s): LUMBAR SPINAL CORD STIMULATOR INSERTION on 08/13/2016   Consultants (if any):   Discharged Condition: Improved  Hospital Course: Stacy Stephenson is an 41 y.o. female who was admitted 08/13/2016 with a diagnosis of Chronic Pain Syndrome and went to the operating room on 08/13/2016 and underwent the above named procedures.  Post op day 1 the pt reports mild pain controlled by oral medication.  Pt reports voiding w/o difficulty.  Pt has been ambulating in the hall.  The pt has met with the rep about the SCS before DC. Pt was cleared PT before DC.  She was given perioperative antibiotics:  Anti-infectives    Start     Dose/Rate Route Frequency Ordered Stop   08/13/16 2100  ceFAZolin (ANCEF) IVPB 1 g/50 mL premix     1 g 100 mL/hr over 30 Minutes Intravenous Every 8 hours 08/13/16 1643 08/14/16 0430   08/13/16 0936  ceFAZolin (ANCEF) IVPB 2g/100 mL premix     2 g 200 mL/hr over 30 Minutes Intravenous 30 min pre-op 08/13/16 0936 08/13/16 1302    .  She was given sequential compression devices, early ambulation, and TED for DVT prophylaxis.  She benefited maximally from the hospital stay and there were no complications.    Recent vital signs:   Vitals:   08/14/16 0400 08/14/16 0836  BP: 100/67 114/79  Pulse: 75 72  Resp: 20 20  Temp: 98.5 F (36.9 C) (!) 97.5 F (36.4 C)    Recent laboratory studies:  Lab Results  Component Value Date   HGB 14.5 08/04/2016   HGB 17.0 (H) 03/29/2013   HGB 16.5 (H) 02/12/2012   Lab Results  Component Value Date   WBC 7.9 08/04/2016   PLT 233 08/04/2016   No results found for: INR Lab Results  Component Value Date   NA 136 08/04/2016   K 2.9 (L) 08/04/2016   CL 102 08/04/2016   CO2 24 08/04/2016   BUN 22 (H) 08/04/2016   CREATININE 0.93 08/04/2016   GLUCOSE 99 08/04/2016    Discharge Medications:   Allergies as of 08/14/2016      Reactions   Venlafaxine    UNSPECIFIED REACTION    Fluoxetine Hcl Anxiety   AGITATION   Latex Dermatitis   Latex tape - tears the skin  Ok to use paper tape   Zolpidem Tartrate Other (See Comments)   AM SLEEPINESS, HANGOVER      Medication List    STOP taking these medications   cephALEXin 250 MG capsule Commonly known as:  KEFLEX   ibuprofen 800 MG tablet Commonly known as:  ADVIL,MOTRIN   oxyCODONE 5 MG immediate release tablet Commonly  known as:  Oxy IR/ROXICODONE     TAKE these medications   Biotin 5000 MCG Tabs Take 1 tablet by mouth 2 (two) times daily.   clonazePAM 2 MG tablet Commonly known as:  KLONOPIN Take 2 mg by mouth at bedtime as needed (Insomnia).   gabapentin 800 MG tablet Commonly known as:  NEURONTIN TAKE 1 TABLET (800 MG TOTAL) BY MOUTH AT BEDTIME.   hydrochlorothiazide 25 MG tablet Commonly known as:  HYDRODIURIL TAKE 1 TABLET BY MOUTH ONCE A DAY   levothyroxine 50 MCG tablet Commonly known as:  SYNTHROID, LEVOTHROID Take 50 mcg by mouth daily before breakfast.   magnesium oxide 400 MG tablet Commonly known as:  MAG-OX Take 400 mg by mouth at bedtime.   methocarbamol 500 MG tablet Commonly known as:  ROBAXIN Take 1 tablet (500 mg total) by mouth 3 (three) times daily as needed for muscle  spasms.   metoprolol tartrate 25 MG tablet Commonly known as:  LOPRESSOR Take 50 mg by mouth at bedtime.   niacin 500 MG CR capsule Take 500 mg by mouth daily.   ondansetron 4 MG tablet Commonly known as:  ZOFRAN Take 1 tablet (4 mg total) by mouth every 8 (eight) hours as needed for nausea or vomiting. What changed:  See the new instructions.   oxyCODONE-acetaminophen 10-325 MG tablet Commonly known as:  PERCOCET Take 1 tablet by mouth every 4 (four) hours as needed for pain.   Potassium 99 MG Tabs Take 1 tablet by mouth daily.   SUPER B COMPLEX PO Take 1 tablet by mouth daily.   Vitamin D (Ergocalciferol) 50000 units Caps capsule Commonly known as:  DRISDOL Take 50,000 Units by mouth every 7 (seven) days. Sundays   zinc gluconate 50 MG tablet Take 50 mg by mouth at bedtime.       Diagnostic Studies: Dg Thoracic Spine 2 View  Result Date: 08/13/2016 CLINICAL DATA:  Placement of dorsal spinal cord stimulating device. EXAM: Operative THORACIC SPINE 2 VIEWS COMPARISON:  Bone window images from CT chest 03/29/2013. FINDINGS: Four spot images from the C-arm fluoroscopic device, AP and lateral views of the lumbar spine are submitted for interpretation postoperatively. A dorsal cord stimulator has been placed overlying the lower thoracic spine. IMPRESSION: Dorsal cord stimulator overlying the lower thoracic spine. Electronically Signed   By: Evangeline Dakin M.D.   On: 08/13/2016 14:48   Dg C-arm 61-120 Min  Result Date: 08/13/2016 CLINICAL DATA:  Placement of dorsal spinal cord stimulating device. EXAM: Operative THORACIC SPINE 2 VIEWS COMPARISON:  Bone window images from CT chest 03/29/2013. FINDINGS: Four spot images from the C-arm fluoroscopic device, AP and lateral views of the lumbar spine are submitted for interpretation postoperatively. A dorsal cord stimulator has been placed overlying the lower thoracic spine. IMPRESSION: Dorsal cord stimulator overlying the lower thoracic  spine. Electronically Signed   By: Evangeline Dakin M.D.   On: 08/13/2016 14:48    Disposition: 01-Home or Self Care Pt will present to clinic in 2 weeks Post op medications provided  Discharge Instructions    Incentive spirometry RT    Complete by:  As directed       Follow-up Information    Melina Schools, MD. Schedule an appointment as soon as possible for a visit in 2 weeks.   Specialty:  Orthopedic Surgery Why:  If symptoms worsen, For suture removal, For wound re-check Contact information: [address] [place] [phone]  Signed: Valinda Hoar 08/23/2016, 8:00 PM

## 2016-09-15 ENCOUNTER — Other Ambulatory Visit: Payer: Self-pay | Admitting: Pain Medicine

## 2016-09-15 DIAGNOSIS — M62838 Other muscle spasm: Secondary | ICD-10-CM

## 2018-07-18 ENCOUNTER — Telehealth: Payer: Self-pay | Admitting: Nurse Practitioner

## 2018-07-18 ENCOUNTER — Other Ambulatory Visit: Payer: Self-pay | Admitting: Physical Medicine and Rehabilitation

## 2018-07-18 DIAGNOSIS — M5136 Other intervertebral disc degeneration, lumbar region: Secondary | ICD-10-CM

## 2018-07-18 NOTE — Telephone Encounter (Signed)
Phone call to patient to verify medication list and allergies for myelogram procedure. Pt aware she will not need to hold any medications for this procedure. Pre and post procedure instructions reviewed with pt. Pt verbalized understanding. 

## 2018-08-10 NOTE — Discharge Instructions (Signed)

## 2018-08-11 ENCOUNTER — Ambulatory Visit
Admission: RE | Admit: 2018-08-11 | Discharge: 2018-08-11 | Disposition: A | Discharge status: Home / Self Care | Payer: BC Managed Care – PPO | Source: Ambulatory Visit | Attending: Physical Medicine and Rehabilitation | Admitting: Physical Medicine and Rehabilitation

## 2018-08-11 VITALS — BP 122/65 | HR 62

## 2018-08-11 DIAGNOSIS — M4807 Spinal stenosis, lumbosacral region: Secondary | ICD-10-CM

## 2018-08-11 DIAGNOSIS — Z9689 Presence of other specified functional implants: Secondary | ICD-10-CM

## 2018-08-11 DIAGNOSIS — M4600 Spinal enthesopathy, site unspecified: Secondary | ICD-10-CM

## 2018-08-11 DIAGNOSIS — M5126 Other intervertebral disc displacement, lumbar region: Secondary | ICD-10-CM

## 2018-08-11 DIAGNOSIS — M47816 Spondylosis without myelopathy or radiculopathy, lumbar region: Secondary | ICD-10-CM

## 2018-08-11 DIAGNOSIS — M5136 Other intervertebral disc degeneration, lumbar region: Secondary | ICD-10-CM

## 2018-08-11 DIAGNOSIS — Z9889 Other specified postprocedural states: Secondary | ICD-10-CM

## 2018-08-11 DIAGNOSIS — M2428 Disorder of ligament, vertebrae: Secondary | ICD-10-CM

## 2018-08-11 MED ORDER — DIAZEPAM 5 MG PO TABS: 10.0000 mg | ORAL_TABLET | Freq: Once | ORAL | Status: DC

## 2018-08-17 ENCOUNTER — Other Ambulatory Visit: Payer: Self-pay

## 2018-08-17 ENCOUNTER — Ambulatory Visit
Admission: RE | Admit: 2018-08-17 | Discharge: 2018-08-17 | Disposition: A | Discharge status: Home / Self Care | Payer: BC Managed Care – PPO | Source: Ambulatory Visit | Attending: Physical Medicine and Rehabilitation | Admitting: Physical Medicine and Rehabilitation

## 2018-08-17 MED ORDER — ONDANSETRON HCL 4 MG/2ML IJ SOLN
4.0000 mg | Freq: Once | INTRAMUSCULAR | Status: AC
  Administered 2018-08-17: 13:00:00 4 mg via INTRAMUSCULAR

## 2018-08-17 MED ORDER — DIAZEPAM 5 MG PO TABS
10.0000 mg | ORAL_TABLET | Freq: Once | ORAL | Status: AC
  Administered 2018-08-17: 10 mg via ORAL

## 2018-08-17 MED ORDER — IOPAMIDOL (ISOVUE-M 200) INJECTION 41%
18.0000 mL | Freq: Once | INTRAMUSCULAR | Status: AC
  Administered 2018-08-17: 14:00:00 18 mL via INTRATHECAL

## 2018-08-17 MED ORDER — MEPERIDINE HCL 50 MG/ML IJ SOLN
50.0000 mg | Freq: Once | INTRAMUSCULAR | Status: AC
  Administered 2018-08-17: 13:00:00 50 mg via INTRAMUSCULAR

## 2018-08-17 NOTE — Discharge Instructions (Signed)
Myelogram Discharge Instructions  1. Go home and rest quietly for the next 24 hours.  It is important to lie flat for the next 24 hours.  Get up only to go to the restroom.  You may lie in the bed or on a couch on your back, your stomach, your left side or your right side.  You may have one pillow under your head.  You may have pillows between your knees while you are on your side or under your knees while you are on your back.  2. DO NOT drive today.  Recline the seat as far back as it will go, while still wearing your seat belt, on the way home.  3. You may get up to go to the bathroom as needed.  You may sit up for 10 minutes to eat.  You may resume your normal diet and medications unless otherwise indicated.  Drink plenty of extra fluids today and tomorrow.  4. The incidence of a spinal headache with nausea and/or vomiting is about 5% (one in 20 patients).  If you develop a headache, lie flat and drink plenty of fluids until the headache goes away.  Caffeinated beverages may be helpful.  If you develop severe nausea and vomiting or a headache that does not go away with flat bed rest, call [phone].  5. You may resume normal activities after your 24 hours of bed rest is over; however, do not exert yourself strongly or do any heavy lifting tomorrow.  6. Call your physician for a follow-up appointment.    You may resume Wellbutrin on Thursday, August 18, 2018 after 1:00p.m.

## 2018-08-26 ENCOUNTER — Other Ambulatory Visit: Payer: BC Managed Care – PPO

## 2020-06-03 ENCOUNTER — Ambulatory Visit: Payer: BC Managed Care – PPO | Admitting: Dietician

## 2020-06-17 ENCOUNTER — Encounter: Payer: Self-pay | Admitting: Dietician

## 2020-06-17 NOTE — Progress Notes (Signed)
Have not heard back from patient to reschedule her missed appointment from 06/03/20. Sent notification to referring provider.

## 2020-07-17 ENCOUNTER — Other Ambulatory Visit: Payer: Self-pay | Admitting: Internal Medicine

## 2020-07-17 DIAGNOSIS — Z1231 Encounter for screening mammogram for malignant neoplasm of breast: Secondary | ICD-10-CM

## 2020-10-14 IMAGING — CT CT LUMBAR SPINE WITH CONTRAST
1 of 6 series · 6 of 14 positions shown, 8 images · non-contrast
Comparison: MRI lumbar spine dated August 09, 2015.

CLINICAL DATA: Chronic low back pain. Prior spinal cord stimulator
placement in 6141. Prior surgery at L5-S1 in 3324. Continued pain.
TECHNIQUE: Contiguous axial images were obtained through the lumbar spine after
the intrathecal infusion of contrast. Coronal and sagittal
reconstructions were obtained of the axial image sets.

[Series 3: l spine soft (person_name) · axial · 0.32mm/px · z∈[-272,-112]mm · 6 of 75 slices shown, 8 images]
[im 11/75  soft-tissue]
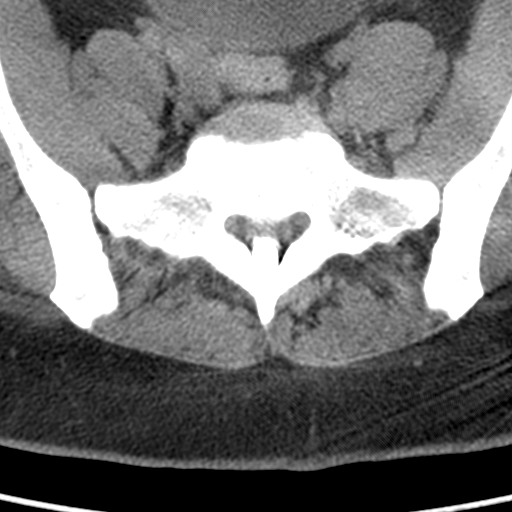
[im 11/75  bone]
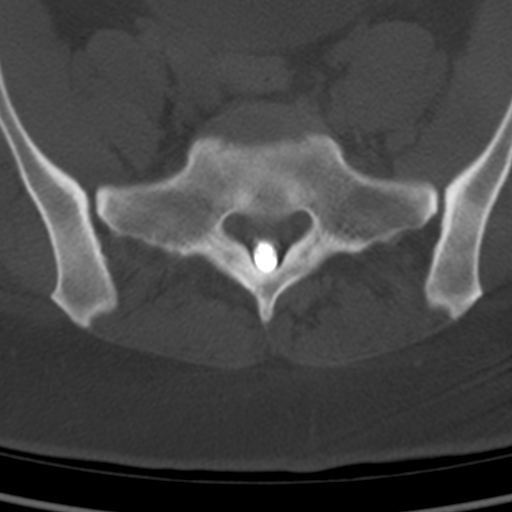
[im 22/75  bone]
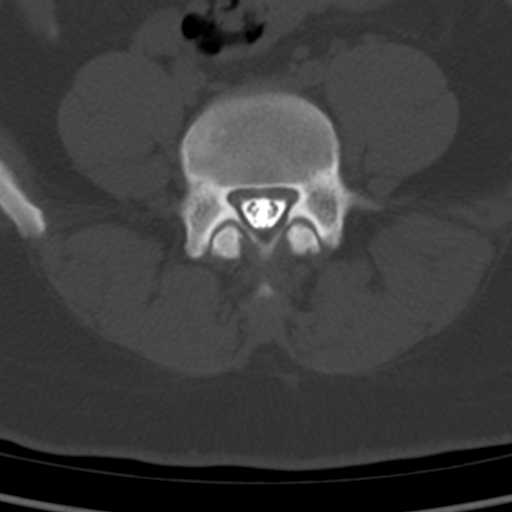
[im 32/75  bone]
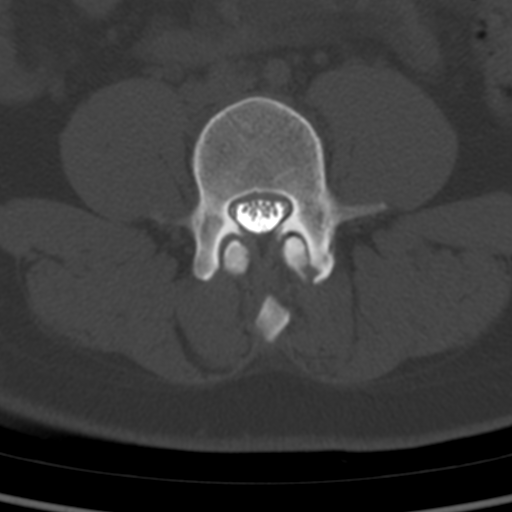
[im 43/75  bone]
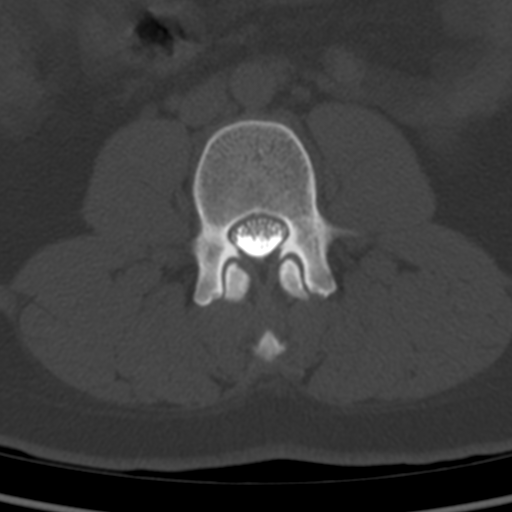
[im 53/75  soft-tissue]
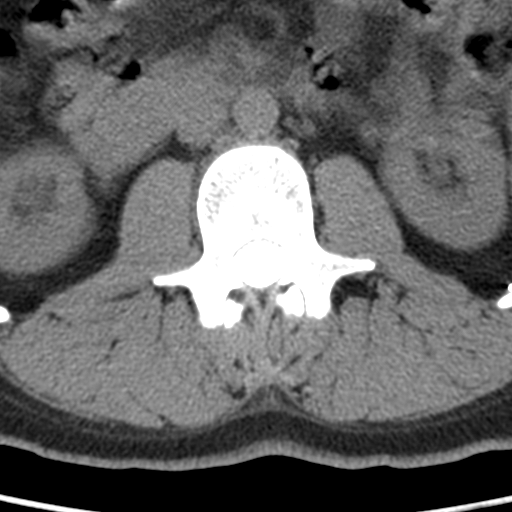
[im 53/75  bone]
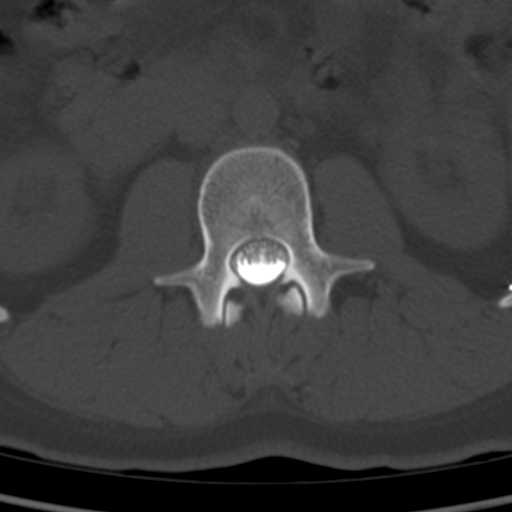
[im 64/75  bone]
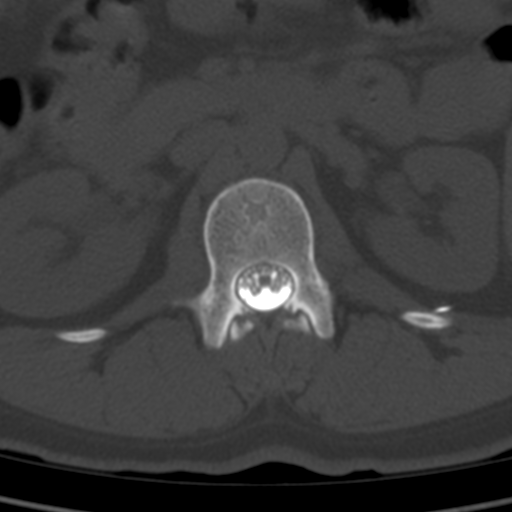

[6 of 14 positions shown; findings below may reference images not displayed]

EXAM:
LUMBAR MYELOGRAM

CT LUMBAR MYELOGRAM

FLUOROSCOPY TIME:  Radiation Exposure Index (as provided by the
fluoroscopic device): 13.7 mGy

Fluoroscopy Time:  9 seconds

Number of Acquired Images:  16

PROCEDURE:
After thorough discussion of risks and benefits of the procedure
including bleeding, infection, injury to nerves, blood vessels,
adjacent structures as well as headache and CSF leak, written and
oral informed consent was obtained. Consent was obtained by Dr.
Yudith Mang. Time out form was completed.

Patient was positioned prone on the fluoroscopy table. Local
anesthesia was provided with 1% lidocaine without epinephrine after
prepped and draped in the usual sterile fashion. Puncture was
performed at L2-L3 using a 3 1/2 inch 22-gauge spinal needle via
right interlaminar approach. Using a single pass through the dura,
the needle was placed within the thecal sac, with return of clear
CSF. 15 mL of Isovue C-6EE was injected into the thecal sac, with
normal opacification of the nerve roots and cauda equina consistent
with free flow within the subarachnoid space.

I personally performed the lumbar puncture and administered the
intrathecal contrast. I also personally supervised acquisition of
the myelogram images.
FINDINGS: LUMBAR MYELOGRAM FINDINGS:

Normal alignment. No dynamic instability. No significant ventral
extradural defect. No spinal canal stenosis or nerve root
effacement/underfilling. Spinal cord stimulator entering the spinal
canal at T9-T10.

CT LUMBAR MYELOGRAM FINDINGS:

Segmentation: Standard.

Alignment: Physiologic.

Vertebrae: No acute fracture or other focal pathologic process.

Conus medullaris and cauda equina: Conus extends to the L1 level.
Conus and cauda equina appear normal.

Paraspinal and other soft tissues: Negative.

Disc levels:

T12-L1:  Negative.

L1-L2:  Negative.

L2-L3:  Negative.

L3-L4: Negative disc. Progressive moderate left facet arthropathy.
No stenosis.

L4-L5:  Negative.

L5-S1: Prior right hemilaminectomy. Small disc protrusion is
slightly more central when compared to prior study. Unchanged mild
bilateral facet arthropathy. Unchanged mild right neuroforaminal
stenosis. No spinal canal left neuroforaminal stenosis.
IMPRESSION: 1. Similar appearing postsurgical changes and mild spondylosis at
L5-S1 with mild right neuroforaminal stenosis. No high-grade
stenosis or impingement.
2. Progressive moderate left facet arthropathy at L3-L4.
3. No dynamic instability.

## 2021-09-10 ENCOUNTER — Other Ambulatory Visit: Payer: Self-pay | Admitting: Internal Medicine

## 2021-09-10 DIAGNOSIS — Z1231 Encounter for screening mammogram for malignant neoplasm of breast: Secondary | ICD-10-CM

## 2021-09-12 ENCOUNTER — Other Ambulatory Visit: Payer: Self-pay | Admitting: Internal Medicine

## 2021-09-12 DIAGNOSIS — R7989 Other specified abnormal findings of blood chemistry: Secondary | ICD-10-CM

## 2021-09-12 DIAGNOSIS — R809 Proteinuria, unspecified: Secondary | ICD-10-CM

## 2021-09-12 DIAGNOSIS — R8290 Unspecified abnormal findings in urine: Secondary | ICD-10-CM

## 2021-09-15 ENCOUNTER — Ambulatory Visit
Admission: RE | Admit: 2021-09-15 | Discharge: 2021-09-15 | Disposition: A | Discharge status: Home / Self Care | Payer: Commercial Managed Care - HMO | Source: Ambulatory Visit | Attending: Internal Medicine | Admitting: Internal Medicine

## 2021-09-15 DIAGNOSIS — Z1231 Encounter for screening mammogram for malignant neoplasm of breast: Secondary | ICD-10-CM | POA: Diagnosis present

## 2021-09-23 ENCOUNTER — Ambulatory Visit: Payer: Commercial Managed Care - HMO

## 2022-02-13 ENCOUNTER — Ambulatory Visit
Admission: EM | Admit: 2022-02-13 | Discharge: 2022-02-13 | Disposition: A | Discharge status: Home / Self Care | Payer: 59 | Attending: Emergency Medicine | Admitting: Emergency Medicine

## 2022-02-13 DIAGNOSIS — T7840XA Allergy, unspecified, initial encounter: Secondary | ICD-10-CM | POA: Diagnosis not present

## 2022-02-13 DIAGNOSIS — R220 Localized swelling, mass and lump, head: Secondary | ICD-10-CM

## 2022-02-13 MED ORDER — METHYLPREDNISOLONE SODIUM SUCC 125 MG IJ SOLR
80.0000 mg | Freq: Once | INTRAMUSCULAR | Status: AC
  Administered 2022-02-13: 80 mg via INTRAMUSCULAR

## 2022-02-13 MED ORDER — PREDNISONE 10 MG (21) PO TBPK: ORAL_TABLET | Freq: Every day | ORAL | 0 refills | Status: DC

## 2022-02-13 NOTE — Discharge Instructions (Addendum)
You were given an injection of a steroid called Solu-Medrol.  Start the prednisone taper tomorrow as directed.    Take Benadryl or Zyrtec as directed.    Follow up with your primary care provider.   Call 911 and go to the emergency department if you have difficulty swallowing or breathing.

## 2022-02-13 NOTE — ED Triage Notes (Signed)
Patient to Urgent Care with complaints of possible allergic reaction. Reports swelling and redness to her face.   Reports that she had hives or contact dermatitis on her face in November and used xyzal/ prednisone/ hydroxyzine. At the time, she was unsure what product broke her out. Reports using a vitamin C serum on Tuesday and her face broke out again. Swelling on left side of face.  Currently taking hydroxyzine at night and xyzal during the day.

## 2022-02-13 NOTE — ED Provider Notes (Signed)
Stacy Stephenson    CSN: [id number] Arrival date & time: 02/13/22  1839      History   Chief Complaint Chief Complaint  Patient presents with   Rash    HPI Stacy Stephenson is a 47 y.o. female.  Patient presents with facial swelling and redness x 3-4 days after she used a new facial serum.  Treatment attempted with hydroxyzine and Xyzal.  No difficulty swallowing or breathing.  She had similar issue in November.  Her medical history includes hypertension, hypothyroidism, cervical cancer, fibromyalgia, migraine headache, chronic pain, chronic low back pain, long term opiate use, depression, anxiety.   The history is provided by the patient and medical records.    Past Medical History:  Diagnosis Date   Cancer ([place])    cervical   Complication of anesthesia    shaking ( breast surgery) hard to wake up with both surgeries   Depression    history of back in 2000-last her father   Family history of adverse reaction to anesthesia    Aunt (dad's sister had same problems)   Fibromyalgia    Hypertension    Hypothyroidism    Migraines    menstrual but not every month    Patient Active Problem List   Diagnosis Date Noted   Status post insertion of spinal cord stimulator 08/13/2016   Chronic pain 08/13/2016   Discogenic low back pain 11/27/2015   Abnormal drug screen 11/27/2015   Marijuana use 10/15/2015   Pain medication agreement broken 10/15/2015   Lumbosacral spondylosis 08/23/2015   Chronic sacroiliac joint pain (Bilateral) (R>L) 08/23/2015   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) 08/23/2015   Lumbosacral osteoarthritis 08/23/2015   L4-5 & L5-S1 Disc Bulge 08/23/2015   Ligamentum flavum hypertrophy L4 and L5 ([place]) 08/23/2015   Lumbar facet arthropathy (L4-5) (Left) 08/23/2015   Lumbosacral foraminal stenosis (L5-S1) (Right) 08/23/2015   History of Right L5-S1 Laminotomy and partial discectomy 08/23/2015   Lumbar facet hypertrophy (L5-S1) (Bilateral)  08/23/2015   L5-S1 paracentral disc protrusion (Right) 08/23/2015   Lumbosacral lateral recess stenosis (L5-S1) (Right) 08/23/2015   Abnormal MRI, lumbar spine (08/09/2015) 08/23/2015   Chronic knee pain (Right) 08/23/2015   Osteoarthritis of knee (Right) 08/23/2015   Claustrophobia 08/08/2015   Excessive and frequent menstruation with irregular cycle 05/30/2015   Occipital headache 05/08/2015   Bilateral occipital neuralgia 05/08/2015   Chronic pain 02/11/2015   Long term current use of opiate analgesic 02/11/2015   Long term prescription opiate use 02/11/2015   Opiate use (22.5 MME/Day) 02/11/2015   Encounter for therapeutic drug level monitoring 02/11/2015   Encounter for chronic pain management 02/11/2015   Chronic low back pain (Location of Primary Source of Pain) (Bilateral) (R>L) 02/11/2015   Lumbar facet syndrome (Bilateral) (R>L) 02/11/2015   Neurogenic pain 02/11/2015   Muscle spasm 02/11/2015   Anxiety state 01/31/2014   Adult hypothyroidism 11/28/2013   Avitaminosis D 11/28/2013   Abnormal weight gain 11/28/2013   Hypothyroidism 11/28/2013   MIGRAINE, COMMON 06/23/2007   GASTROENTERITIS, ACUTE 06/23/2007   DEPRESSION 01/24/2007   HYPERTENSION 01/24/2007    Past Surgical History:  Procedure Laterality Date   BREAST SURGERY  1997   reduction   CERVICAL CONIZATION W/BX     13 yrs. ago   MICRODISCECTOMY LUMBAR     SPINAL CORD STIMULATOR INSERTION N/A 08/13/2016   Procedure: LUMBAR SPINAL CORD STIMULATOR INSERTION;  Surgeon: Melina Schools, MD;  Location: [facility];  Service: Orthopedics;  Laterality: N/A;  150 mins   WISDOM TOOTH EXTRACTION      OB History   No obstetric history on file.      Home Medications    Prior to Admission medications   Medication Sig Start Date End Date Taking? Authorizing Provider  predniSONE (STERAPRED UNI-PAK 21 TAB) 10 MG (21) TBPK tablet Take by mouth daily. As directed 02/14/22  Yes Sharion Balloon, NP  B Complex-C (SUPER B COMPLEX  PO) Take 1 tablet by mouth daily.    [provider]  Biotin 5000 MCG TABS Take 1 tablet by mouth 2 (two) times daily.     [provider]  buPROPion (WELLBUTRIN XL) 150 MG 24 hr tablet bupropion HCl XL 150 mg 24 hr tablet, extended release  TAKE 1 TABLET BY MOUTH EVERY DAY 07/18/18   [provider]  cephALEXin (KEFLEX) 250 MG capsule Take by mouth 4 (four) times daily.    [provider]  clonazePAM (KLONOPIN) 2 MG tablet Take 2 mg by mouth at bedtime as needed (Insomnia).  09/04/15   [provider]  gabapentin (NEURONTIN) 800 MG tablet TAKE 1 TABLET (800 MG TOTAL) BY MOUTH AT BEDTIME. 05/25/16 11/21/16  Vevelyn Francois, NP  hydrochlorothiazide (HYDRODIURIL) 25 MG tablet TAKE 1 TABLET BY MOUTH ONCE A DAY 07/31/15   [provider]  levothyroxine (SYNTHROID, LEVOTHROID) 50 MCG tablet Take 50 mcg by mouth daily before breakfast.  11/15/14   [provider]  magnesium oxide (MAG-OX) 400 MG tablet Take 400 mg by mouth at bedtime.     [provider]  methocarbamol (ROBAXIN) 500 MG tablet Take 1 tablet (500 mg total) by mouth 3 (three) times daily as needed for muscle spasms. Patient not taking: Reported on 07/18/2018 08/13/16   Melina Schools, MD  metoprolol tartrate (LOPRESSOR) 25 MG tablet Take 50 mg by mouth at bedtime.  06/21/14   [provider]  niacin 500 MG CR capsule Take 500 mg by mouth daily.     [provider]  ondansetron (ZOFRAN) 4 MG tablet Take 1 tablet (4 mg total) by mouth every 8 (eight) hours as needed for nausea or vomiting. 08/13/16   Melina Schools, MD  oxyCODONE-acetaminophen (PERCOCET) 10-325 MG tablet Take 1 tablet by mouth every 4 (four) hours as needed for pain. 08/13/16   Melina Schools, MD  Potassium 99 MG TABS Take 1 tablet by mouth daily.    [provider]  Vitamin D, Ergocalciferol, (DRISDOL) 50000 units CAPS capsule Take 50,000 Units by mouth every 7 (seven) days. Sundays  01/30/14   [provider]  zinc gluconate 50 MG tablet Take 50 mg by mouth at bedtime.     [provider]    Family History Family History  Problem Relation Age of Onset   Arthritis Mother    Heart disease Father    Diabetes Father    Stroke Father     Social History Social History   Tobacco Use   Smoking status: Former    Packs/day: 0.75    Years: 16.00    Total pack years: 12.00    Types: Cigarettes   Smokeless tobacco: Never  Vaping Use   Vaping Use: Every day  Substance Use Topics   Alcohol use: No    Alcohol/week: 0.0 standard drinks of alcohol   Drug use: No     Allergies   Venlafaxine, Fluoxetine hcl, Latex, Other, and Zolpidem tartrate   Review of Systems Review of Systems  Constitutional:  Negative for chills and fever.  HENT:  Positive for facial swelling. Negative for ear pain, sore throat and trouble swallowing.   Respiratory:  Negative for cough and shortness of breath.   Cardiovascular:  Negative for chest pain and palpitations.  Skin:  Positive for color change and rash.  All other systems reviewed and are negative.    Physical Exam Triage Vital Signs ED Triage Vitals  Enc Vitals Group     BP 02/13/22 1914 126/81     Pulse Rate 02/13/22 1902 74     Resp 02/13/22 1902 18     Temp 02/13/22 1902 98.4 F (36.9 C)     Temp src --      SpO2 02/13/22 1902 98 %     Weight --      Height 02/13/22 1910 '5\' 2"'$  (1.575 m)     Head Circumference --      Peak Flow --      Pain Score 02/13/22 1904 2     Pain Loc --      Pain Edu? --      Excl. in [place]? --    No data found.  Updated Vital Signs BP 126/81   Pulse 74   Temp 98.4 F (36.9 C)   Resp 18   Ht '5\' 2"'$  (1.575 m)   SpO2 98%   BMI 27.44 kg/m   Visual Acuity Right Eye Distance:   Left Eye Distance:   Bilateral Distance:    Right Eye Near:   Left Eye Near:    Bilateral Near:     Physical Exam Vitals and nursing note reviewed.  Constitutional:      General:  She is not in acute distress.    Appearance: Normal appearance. She is well-developed. She is not ill-appearing.  HENT:     Head:     Comments: Mild generalized facial edema, worse on left side.     Mouth/Throat:     Mouth: Mucous membranes are moist.     Pharynx: Oropharynx is clear.     Comments: Speech clear, no difficulty swallowing, no oropharyngeal swelling.  Cardiovascular:     Rate and Rhythm: Normal rate and regular rhythm.     Heart sounds: Normal heart sounds.  Pulmonary:     Effort: Pulmonary effort is normal. No respiratory distress.     Breath sounds: Normal breath sounds. No stridor. No wheezing.     Comments: Good air movement.  Musculoskeletal:     Cervical back: Neck supple.  Skin:    General: Skin is warm and dry.  Neurological:     Mental Status: She is alert.  Psychiatric:        Mood and Affect: Mood normal.        Behavior: Behavior normal.      UC Treatments / Results  Labs (all labs ordered are listed, but only abnormal results are displayed) Labs Reviewed - No data to display  EKG   Radiology No results found.  Procedures Procedures (including critical care time)  Medications Ordered in UC Medications  methylPREDNISolone sodium succinate (SOLU-MEDROL) 125 mg/2 mL injection 80 mg (has no administration in time range)    Initial Impression / Assessment and Plan / UC Course  I have reviewed the triage vital signs and the nursing notes.  Pertinent labs & imaging results that were available during my care of the patient were reviewed by me and considered in my medical decision making (see chart for details).  Allergic reaction, facial swelling.  Patient has stopped using the facial serum (last used on 02/10/2022).  Solu-medrol given here.  Starting prednisone taper tomorrow.  Instructed patient to take Benadryl or Zyrtec also.  Instructed her to follow up with her PCP on Monday.  ED precautions given; Education provided on anaphylactic  reaction.  She agrees to plan of care.    Final Clinical Impressions(s) / UC Diagnoses   Final diagnoses:  Allergic reaction, initial encounter  Facial swelling     Discharge Instructions      You were given an injection of a steroid called Solu-Medrol.  Start the prednisone taper tomorrow as directed.    Take Benadryl or Zyrtec as directed.    Follow up with your primary care provider.   Call 911 and go to the emergency department if you have difficulty swallowing or breathing.        ED Prescriptions     Medication Sig Dispense Auth. Provider   predniSONE (STERAPRED UNI-PAK 21 TAB) 10 MG (21) TBPK tablet Take by mouth daily. As directed 21 tablet Sharion Balloon, NP      PDMP not reviewed this encounter.   Sharion Balloon, NP 02/13/22 1939

## 2022-02-24 DIAGNOSIS — G8929 Other chronic pain: Secondary | ICD-10-CM | POA: Diagnosis not present

## 2022-02-24 DIAGNOSIS — M545 Low back pain, unspecified: Secondary | ICD-10-CM | POA: Diagnosis not present

## 2022-02-24 DIAGNOSIS — R69 Illness, unspecified: Secondary | ICD-10-CM | POA: Diagnosis not present

## 2022-02-24 DIAGNOSIS — E039 Hypothyroidism, unspecified: Secondary | ICD-10-CM | POA: Diagnosis not present

## 2022-02-24 DIAGNOSIS — M25561 Pain in right knee: Secondary | ICD-10-CM | POA: Diagnosis not present

## 2022-02-24 DIAGNOSIS — E559 Vitamin D deficiency, unspecified: Secondary | ICD-10-CM | POA: Diagnosis not present

## 2022-02-24 DIAGNOSIS — E538 Deficiency of other specified B group vitamins: Secondary | ICD-10-CM | POA: Diagnosis not present

## 2022-02-24 DIAGNOSIS — Z0000 Encounter for general adult medical examination without abnormal findings: Secondary | ICD-10-CM | POA: Diagnosis not present

## 2022-02-24 DIAGNOSIS — R490 Dysphonia: Secondary | ICD-10-CM | POA: Diagnosis not present

## 2022-02-24 DIAGNOSIS — J209 Acute bronchitis, unspecified: Secondary | ICD-10-CM | POA: Diagnosis not present

## 2022-03-03 DIAGNOSIS — R635 Abnormal weight gain: Secondary | ICD-10-CM | POA: Diagnosis not present

## 2022-03-03 DIAGNOSIS — E039 Hypothyroidism, unspecified: Secondary | ICD-10-CM | POA: Diagnosis not present

## 2022-03-03 DIAGNOSIS — M545 Low back pain, unspecified: Secondary | ICD-10-CM | POA: Diagnosis not present

## 2022-03-03 DIAGNOSIS — M961 Postlaminectomy syndrome, not elsewhere classified: Secondary | ICD-10-CM | POA: Diagnosis not present

## 2022-03-03 DIAGNOSIS — G8929 Other chronic pain: Secondary | ICD-10-CM | POA: Diagnosis not present

## 2022-03-03 DIAGNOSIS — I10 Essential (primary) hypertension: Secondary | ICD-10-CM | POA: Diagnosis not present

## 2022-03-03 DIAGNOSIS — Z6831 Body mass index (BMI) 31.0-31.9, adult: Secondary | ICD-10-CM | POA: Diagnosis not present

## 2022-03-03 DIAGNOSIS — E559 Vitamin D deficiency, unspecified: Secondary | ICD-10-CM | POA: Diagnosis not present

## 2022-05-06 DIAGNOSIS — J020 Streptococcal pharyngitis: Secondary | ICD-10-CM | POA: Diagnosis not present

## 2022-05-06 DIAGNOSIS — G8929 Other chronic pain: Secondary | ICD-10-CM | POA: Diagnosis not present

## 2022-05-06 DIAGNOSIS — E039 Hypothyroidism, unspecified: Secondary | ICD-10-CM | POA: Diagnosis not present

## 2022-05-06 DIAGNOSIS — U071 COVID-19: Secondary | ICD-10-CM | POA: Diagnosis not present

## 2022-05-06 DIAGNOSIS — M545 Low back pain, unspecified: Secondary | ICD-10-CM | POA: Diagnosis not present

## 2022-05-06 DIAGNOSIS — R531 Weakness: Secondary | ICD-10-CM | POA: Diagnosis not present

## 2022-05-06 DIAGNOSIS — R509 Fever, unspecified: Secondary | ICD-10-CM | POA: Diagnosis not present

## 2022-05-06 DIAGNOSIS — M791 Myalgia, unspecified site: Secondary | ICD-10-CM | POA: Diagnosis not present

## 2022-05-06 DIAGNOSIS — J029 Acute pharyngitis, unspecified: Secondary | ICD-10-CM | POA: Diagnosis not present

## 2022-05-26 DIAGNOSIS — Z5181 Encounter for therapeutic drug level monitoring: Secondary | ICD-10-CM | POA: Diagnosis not present

## 2022-05-26 DIAGNOSIS — Z79899 Other long term (current) drug therapy: Secondary | ICD-10-CM | POA: Diagnosis not present

## 2022-08-28 DIAGNOSIS — M545 Low back pain, unspecified: Secondary | ICD-10-CM | POA: Diagnosis not present

## 2022-08-28 DIAGNOSIS — G8929 Other chronic pain: Secondary | ICD-10-CM | POA: Diagnosis not present

## 2022-08-28 DIAGNOSIS — R8290 Unspecified abnormal findings in urine: Secondary | ICD-10-CM | POA: Diagnosis not present

## 2022-08-28 DIAGNOSIS — I10 Essential (primary) hypertension: Secondary | ICD-10-CM | POA: Diagnosis not present

## 2022-08-28 DIAGNOSIS — R635 Abnormal weight gain: Secondary | ICD-10-CM | POA: Diagnosis not present

## 2022-08-28 DIAGNOSIS — M961 Postlaminectomy syndrome, not elsewhere classified: Secondary | ICD-10-CM | POA: Diagnosis not present

## 2022-08-28 DIAGNOSIS — E039 Hypothyroidism, unspecified: Secondary | ICD-10-CM | POA: Diagnosis not present

## 2022-08-28 DIAGNOSIS — E559 Vitamin D deficiency, unspecified: Secondary | ICD-10-CM | POA: Diagnosis not present

## 2022-08-28 DIAGNOSIS — Z6831 Body mass index (BMI) 31.0-31.9, adult: Secondary | ICD-10-CM | POA: Diagnosis not present

## 2022-09-01 DIAGNOSIS — Z1339 Encounter for screening examination for other mental health and behavioral disorders: Secondary | ICD-10-CM | POA: Diagnosis not present

## 2022-09-01 DIAGNOSIS — E559 Vitamin D deficiency, unspecified: Secondary | ICD-10-CM | POA: Diagnosis not present

## 2022-09-01 DIAGNOSIS — Z1331 Encounter for screening for depression: Secondary | ICD-10-CM | POA: Diagnosis not present

## 2022-09-01 DIAGNOSIS — I10 Essential (primary) hypertension: Secondary | ICD-10-CM | POA: Diagnosis not present

## 2022-09-01 DIAGNOSIS — M5136 Other intervertebral disc degeneration, lumbar region: Secondary | ICD-10-CM | POA: Diagnosis not present

## 2022-09-01 DIAGNOSIS — R7301 Impaired fasting glucose: Secondary | ICD-10-CM | POA: Diagnosis not present

## 2022-09-01 DIAGNOSIS — E039 Hypothyroidism, unspecified: Secondary | ICD-10-CM | POA: Diagnosis not present

## 2022-09-01 DIAGNOSIS — M961 Postlaminectomy syndrome, not elsewhere classified: Secondary | ICD-10-CM | POA: Diagnosis not present

## 2022-09-01 DIAGNOSIS — Z0000 Encounter for general adult medical examination without abnormal findings: Secondary | ICD-10-CM | POA: Diagnosis not present

## 2022-09-01 DIAGNOSIS — Z6832 Body mass index (BMI) 32.0-32.9, adult: Secondary | ICD-10-CM | POA: Diagnosis not present

## 2022-09-01 DIAGNOSIS — F3341 Major depressive disorder, recurrent, in partial remission: Secondary | ICD-10-CM | POA: Diagnosis not present

## 2022-09-01 DIAGNOSIS — E782 Mixed hyperlipidemia: Secondary | ICD-10-CM | POA: Diagnosis not present

## 2022-09-02 ENCOUNTER — Other Ambulatory Visit: Payer: Self-pay | Admitting: Internal Medicine

## 2022-09-02 DIAGNOSIS — Z1231 Encounter for screening mammogram for malignant neoplasm of breast: Secondary | ICD-10-CM

## 2022-09-10 DIAGNOSIS — M5416 Radiculopathy, lumbar region: Secondary | ICD-10-CM | POA: Diagnosis not present

## 2022-09-23 DIAGNOSIS — M961 Postlaminectomy syndrome, not elsewhere classified: Secondary | ICD-10-CM | POA: Diagnosis not present

## 2022-09-23 DIAGNOSIS — M545 Low back pain, unspecified: Secondary | ICD-10-CM | POA: Diagnosis not present

## 2022-09-23 DIAGNOSIS — G894 Chronic pain syndrome: Secondary | ICD-10-CM | POA: Diagnosis not present

## 2022-09-23 DIAGNOSIS — M47816 Spondylosis without myelopathy or radiculopathy, lumbar region: Secondary | ICD-10-CM | POA: Diagnosis not present

## 2022-09-23 DIAGNOSIS — M797 Fibromyalgia: Secondary | ICD-10-CM | POA: Diagnosis not present

## 2022-09-24 ENCOUNTER — Ambulatory Visit
Admission: RE | Admit: 2022-09-24 | Discharge: 2022-09-24 | Disposition: A | Discharge status: Home / Self Care | Payer: 59 | Source: Ambulatory Visit | Attending: Internal Medicine | Admitting: Internal Medicine

## 2022-09-24 DIAGNOSIS — Z1231 Encounter for screening mammogram for malignant neoplasm of breast: Secondary | ICD-10-CM | POA: Diagnosis not present

## 2022-10-15 DIAGNOSIS — Z6831 Body mass index (BMI) 31.0-31.9, adult: Secondary | ICD-10-CM | POA: Diagnosis not present

## 2022-10-15 DIAGNOSIS — R635 Abnormal weight gain: Secondary | ICD-10-CM | POA: Diagnosis not present

## 2022-10-19 DIAGNOSIS — R635 Abnormal weight gain: Secondary | ICD-10-CM | POA: Diagnosis not present

## 2022-10-19 DIAGNOSIS — Z1331 Encounter for screening for depression: Secondary | ICD-10-CM | POA: Diagnosis not present

## 2022-10-19 DIAGNOSIS — R7301 Impaired fasting glucose: Secondary | ICD-10-CM | POA: Diagnosis not present

## 2022-10-19 DIAGNOSIS — E559 Vitamin D deficiency, unspecified: Secondary | ICD-10-CM | POA: Diagnosis not present

## 2022-10-19 DIAGNOSIS — E039 Hypothyroidism, unspecified: Secondary | ICD-10-CM | POA: Diagnosis not present

## 2022-10-19 DIAGNOSIS — E669 Obesity, unspecified: Secondary | ICD-10-CM | POA: Diagnosis not present

## 2022-10-19 DIAGNOSIS — Z1339 Encounter for screening examination for other mental health and behavioral disorders: Secondary | ICD-10-CM | POA: Diagnosis not present

## 2022-10-28 DIAGNOSIS — Z6832 Body mass index (BMI) 32.0-32.9, adult: Secondary | ICD-10-CM | POA: Diagnosis not present

## 2022-10-28 DIAGNOSIS — E559 Vitamin D deficiency, unspecified: Secondary | ICD-10-CM | POA: Diagnosis not present

## 2022-11-07 ENCOUNTER — Encounter: Payer: Self-pay | Admitting: Emergency Medicine

## 2022-11-07 ENCOUNTER — Ambulatory Visit
Admission: EM | Admit: 2022-11-07 | Discharge: 2022-11-07 | Disposition: A | Discharge status: Home / Self Care | Payer: 59

## 2022-11-07 ENCOUNTER — Other Ambulatory Visit: Payer: Self-pay

## 2022-11-07 DIAGNOSIS — R21 Rash and other nonspecific skin eruption: Secondary | ICD-10-CM | POA: Diagnosis not present

## 2022-11-07 MED ORDER — DEXAMETHASONE SODIUM PHOSPHATE 10 MG/ML IJ SOLN
10.0000 mg | Freq: Once | INTRAMUSCULAR | Status: AC
  Administered 2022-11-07: 10 mg via INTRAMUSCULAR

## 2022-11-07 MED ORDER — PREDNISONE 10 MG (21) PO TBPK: ORAL_TABLET | Freq: Every day | ORAL | 0 refills | Status: AC

## 2022-11-07 NOTE — ED Triage Notes (Signed)
Pt here for start of rash. Pt reports during fall every year for last 4 years.  Denies fevers.  Has seen allergist and told maybe contact dermitis.  Pt c/o small bumps that itch to face/upper neck and one area right arm.

## 2022-11-07 NOTE — Discharge Instructions (Addendum)
Continue taking Xyzal as directed.    You were given an injection of dexamethasone today.  Start the prednisone taper tomorrow.    Follow-up with your primary care provider or dermatologist if your symptoms are not improving.

## 2022-11-07 NOTE — ED Provider Notes (Signed)
Renaldo Fiddler    CSN: [id number] Arrival date & time: 11/07/22  1405      History   Chief Complaint Chief Complaint  Patient presents with   Rash    HPI Stacy Stephenson is a 47 y.o. female.  Patient presents with a painful and tender rash on her face and right forearm x 4 days.  This rash has been recurrent over the last few years.  She reports she was seen by dermatology for this in 2023 and told to take Xyzal which she has been doing.  No OTC medications taken today.  No fever, sore throat, difficulty swallowing, difficulty breathing, or other symptoms.  No new products, medications, foods.  No recent travel.  The history is provided by the patient and medical records.    Past Medical History:  Diagnosis Date   Cancer (HCC)    cervical   Complication of anesthesia    shaking ( breast surgery) hard to wake up with both surgeries   Depression    history of back in 2000-last her father   Family history of adverse reaction to anesthesia    Aunt (dad's sister had same problems)   Fibromyalgia    Hypertension    Hypothyroidism    Migraines    menstrual but not every month    Patient Active Problem List   Diagnosis Date Noted   Status post insertion of spinal cord stimulator 08/13/2016   Chronic pain 08/13/2016   Discogenic low back pain 11/27/2015   Abnormal drug screen 11/27/2015   Marijuana use 10/15/2015   Pain medication agreement broken 10/15/2015   Lumbosacral spondylosis 08/23/2015   Chronic sacroiliac joint pain (Bilateral) (R>L) 08/23/2015   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) 08/23/2015   Lumbosacral osteoarthritis 08/23/2015   L4-5 & L5-S1 Disc Bulge 08/23/2015   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) 08/23/2015   Lumbar facet arthropathy (L4-5) (Left) 08/23/2015   Lumbosacral foraminal stenosis (L5-S1) (Right) 08/23/2015   History of Right L5-S1 Laminotomy and partial discectomy 08/23/2015   Lumbar facet hypertrophy  (L5-S1) (Bilateral) 08/23/2015   L5-S1 paracentral disc protrusion (Right) 08/23/2015   Lumbosacral lateral recess stenosis (L5-S1) (Right) 08/23/2015   Abnormal MRI, lumbar spine (08/09/2015) 08/23/2015   Chronic knee pain (Right) 08/23/2015   Osteoarthritis of knee (Right) 08/23/2015   Claustrophobia 08/08/2015   Excessive and frequent menstruation with irregular cycle 05/30/2015   Occipital headache 05/08/2015   Bilateral occipital neuralgia 05/08/2015   Chronic pain 02/11/2015   Long term current use of opiate analgesic 02/11/2015   Long term prescription opiate use 02/11/2015   Opiate use (22.5 MME/Day) 02/11/2015   Encounter for therapeutic drug level monitoring 02/11/2015   Encounter for chronic pain management 02/11/2015   Chronic low back pain (Location of Primary Source of Pain) (Bilateral) (R>L) 02/11/2015   Lumbar facet syndrome (Bilateral) (R>L) 02/11/2015   Neurogenic pain 02/11/2015   Muscle spasm 02/11/2015   Anxiety state 01/31/2014   Adult hypothyroidism 11/28/2013   Avitaminosis D 11/28/2013   Abnormal weight gain 11/28/2013   Hypothyroidism 11/28/2013   Migraine without aura 06/23/2007   GASTROENTERITIS, ACUTE 06/23/2007   DEPRESSION 01/24/2007   Essential hypertension 01/24/2007    Past Surgical History:  Procedure Laterality Date   BREAST SURGERY  1997   reduction   CERVICAL CONIZATION W/BX     13 yrs. ago   MICRODISCECTOMY LUMBAR     SPINAL CORD STIMULATOR INSERTION N/A 08/13/2016   Procedure: LUMBAR SPINAL CORD  STIMULATOR INSERTION;  Surgeon: Venita Lick, MD;  Location: [facility] OR;  Service: Orthopedics;  Laterality: N/A;  150 mins   WISDOM TOOTH EXTRACTION      OB History   No obstetric history on file.      Home Medications    Prior to Admission medications   Medication Sig Start Date End Date Taking? Authorizing Provider  fenofibrate (TRICOR) 145 MG tablet Take 145 mg by mouth daily.   Yes [provider]  predniSONE (STERAPRED  UNI-PAK 21 TAB) 10 MG (21) TBPK tablet Take by mouth daily. As directed 11/08/22  Yes Mickie Bail, NP  SPIRONOLACTONE PO Take by mouth.   Yes [provider]  B Complex-C (SUPER B COMPLEX PO) Take 1 tablet by mouth daily.    [provider]  Biotin 5000 MCG TABS Take 1 tablet by mouth 2 (two) times daily.     [provider]  buPROPion (WELLBUTRIN XL) 150 MG 24 hr tablet bupropion HCl XL 150 mg 24 hr tablet, extended release  TAKE 1 TABLET BY MOUTH EVERY DAY 07/18/18   [provider]  cephALEXin (KEFLEX) 250 MG capsule Take by mouth 4 (four) times daily.    [provider]  clonazePAM (KLONOPIN) 2 MG tablet Take 2 mg by mouth at bedtime as needed (Insomnia).  09/04/15   [provider]  gabapentin (NEURONTIN) 800 MG tablet TAKE 1 TABLET (800 MG TOTAL) BY MOUTH AT BEDTIME. 05/25/16 11/21/16  Barbette Merino, NP  hydrochlorothiazide (HYDRODIURIL) 25 MG tablet TAKE 1 TABLET BY MOUTH ONCE A DAY 07/31/15   [provider]  levothyroxine (SYNTHROID, LEVOTHROID) 50 MCG tablet Take 50 mcg by mouth daily before breakfast.  11/15/14   [provider]  magnesium oxide (MAG-OX) 400 MG tablet Take 400 mg by mouth at bedtime.     [provider]  methocarbamol (ROBAXIN) 500 MG tablet Take 1 tablet (500 mg total) by mouth 3 (three) times daily as needed for muscle spasms. Patient not taking: Reported on 07/18/2018 08/13/16   Venita Lick, MD  metoprolol tartrate (LOPRESSOR) 25 MG tablet Take 50 mg by mouth at bedtime.  06/21/14   [provider]  niacin 500 MG CR capsule Take 500 mg by mouth daily.     [provider]  ondansetron (ZOFRAN) 4 MG tablet Take 1 tablet (4 mg total) by mouth every 8 (eight) hours as needed for nausea or vomiting. 08/13/16   Venita Lick, MD  oxyCODONE-acetaminophen (PERCOCET) 10-325 MG tablet Take 1 tablet by mouth every 4 (four) hours as needed for pain. 08/13/16   Venita Lick, MD   Potassium 99 MG TABS Take 1 tablet by mouth daily.    [provider]  Vitamin D, Ergocalciferol, (DRISDOL) 50000 units CAPS capsule Take 50,000 Units by mouth every 7 (seven) days. Sundays 01/30/14   [provider]  zinc gluconate 50 MG tablet Take 50 mg by mouth at bedtime.     [provider]    Family History Family History  Problem Relation Age of Onset   Arthritis Mother    Heart disease Father    Diabetes Father    Stroke Father    Breast cancer Paternal Aunt     Social History Social History   Tobacco Use   Smoking status: Former    Current packs/day: 0.75    Average packs/day: 0.8 packs/day for 16.0 years (12.0 ttl pk-yrs)    Types: Cigarettes   Smokeless tobacco: Never  Vaping Use   Vaping status: Every Day  Substance Use Topics   Alcohol use: No    Alcohol/week: 0.0 standard drinks of alcohol   Drug use: No     Allergies   Venlafaxine, Fluoxetine hcl, Latex, Other, and Zolpidem tartrate   Review of Systems Review of Systems  Constitutional:  Negative for chills and fever.  HENT:  Negative for sore throat, trouble swallowing and voice change.   Respiratory:  Negative for cough and shortness of breath.   Skin:  Positive for color change and rash.     Physical Exam Triage Vital Signs ED Triage Vitals [11/07/22 1424]  Encounter Vitals Group     BP 121/86     Systolic BP Percentile      Diastolic BP Percentile      Pulse Rate 72     Resp 14     Temp 97.9 F (36.6 C)     Temp Source Oral     SpO2 100 %     Weight      Height      Head Circumference      Peak Flow      Pain Score 0     Pain Loc      Pain Education      Exclude from Growth Chart    No data found.  Updated Vital Signs BP 121/86   Pulse 72   Temp 97.9 F (36.6 C) (Oral)   Resp 14   LMP 10/15/2022   SpO2 100%   Visual Acuity Right Eye Distance:   Left Eye Distance:   Bilateral Distance:    Right Eye Near:   Left Eye Near:    Bilateral  Near:     Physical Exam Vitals and nursing note reviewed.  Constitutional:      General: She is not in acute distress.    Appearance: She is well-developed.  HENT:     Mouth/Throat:     Mouth: Mucous membranes are moist.  Cardiovascular:     Rate and Rhythm: Normal rate and regular rhythm.  Pulmonary:     Effort: Pulmonary effort is normal. No respiratory distress.  Musculoskeletal:     Cervical back: Neck supple.  Skin:    General: Skin is warm and dry.     Findings: Rash present.     Comments: Generalized redness of face with scattered papular rash.  Flesh-colored papular rash on right forearm.  No open wounds or drainage.  Neurological:     Mental Status: She is alert.  Psychiatric:        Mood and Affect: Mood normal.        Behavior: Behavior normal.      UC Treatments / Results  Labs (all labs ordered are listed, but only abnormal results are displayed) Labs Reviewed - No data to display  EKG   Radiology No results found.  Procedures Procedures (including critical care time)  Medications Ordered in UC Medications  dexamethasone (DECADRON) injection 10 mg (has no administration in time range)    Initial Impression / Assessment and Plan / UC Course  I have reviewed the triage vital signs and the nursing notes.  Pertinent labs & imaging results that were available during my care of the patient were reviewed by me and considered in my medical decision making (see chart for details).    Rash.  Instructed patient to continue taking Xyzal.  Dexamethasone given here and starting prednisone taper tomorrow.  Instructed patient to  follow-up with her PCP or dermatologist if her symptoms are not improving.  Education provided on adult rash.  She agrees to plan of care.  Final Clinical Impressions(s) / UC Diagnoses   Final diagnoses:  Rash     Discharge Instructions      Continue taking Xyzal as directed.    You were given an injection of dexamethasone  today.  Start the prednisone taper tomorrow.    Follow-up with your primary care provider or dermatologist if your symptoms are not improving.     ED Prescriptions     Medication Sig Dispense Auth. Provider   predniSONE (STERAPRED UNI-PAK 21 TAB) 10 MG (21) TBPK tablet Take by mouth daily. As directed 21 tablet Mickie Bail, NP      PDMP not reviewed this encounter.   Mickie Bail, NP 11/07/22 [phone]

## 2022-11-10 DIAGNOSIS — E039 Hypothyroidism, unspecified: Secondary | ICD-10-CM | POA: Diagnosis not present

## 2022-11-10 DIAGNOSIS — Z6831 Body mass index (BMI) 31.0-31.9, adult: Secondary | ICD-10-CM | POA: Diagnosis not present

## 2022-11-19 DIAGNOSIS — F411 Generalized anxiety disorder: Secondary | ICD-10-CM | POA: Diagnosis not present

## 2022-11-19 DIAGNOSIS — Z9689 Presence of other specified functional implants: Secondary | ICD-10-CM | POA: Diagnosis not present

## 2022-11-19 DIAGNOSIS — E039 Hypothyroidism, unspecified: Secondary | ICD-10-CM | POA: Diagnosis not present

## 2022-11-19 DIAGNOSIS — M545 Low back pain, unspecified: Secondary | ICD-10-CM | POA: Diagnosis not present

## 2022-11-19 DIAGNOSIS — G8929 Other chronic pain: Secondary | ICD-10-CM | POA: Diagnosis not present

## 2022-11-19 DIAGNOSIS — Z6831 Body mass index (BMI) 31.0-31.9, adult: Secondary | ICD-10-CM | POA: Diagnosis not present

## 2022-11-19 DIAGNOSIS — I10 Essential (primary) hypertension: Secondary | ICD-10-CM | POA: Diagnosis not present

## 2022-11-19 DIAGNOSIS — J3489 Other specified disorders of nose and nasal sinuses: Secondary | ICD-10-CM | POA: Diagnosis not present

## 2022-11-19 DIAGNOSIS — J019 Acute sinusitis, unspecified: Secondary | ICD-10-CM | POA: Diagnosis not present

## 2022-11-19 DIAGNOSIS — E559 Vitamin D deficiency, unspecified: Secondary | ICD-10-CM | POA: Diagnosis not present

## 2022-11-25 DIAGNOSIS — R7301 Impaired fasting glucose: Secondary | ICD-10-CM | POA: Diagnosis not present

## 2022-11-25 DIAGNOSIS — Z6831 Body mass index (BMI) 31.0-31.9, adult: Secondary | ICD-10-CM | POA: Diagnosis not present

## 2022-12-02 DIAGNOSIS — Z6831 Body mass index (BMI) 31.0-31.9, adult: Secondary | ICD-10-CM | POA: Diagnosis not present

## 2022-12-02 DIAGNOSIS — E039 Hypothyroidism, unspecified: Secondary | ICD-10-CM | POA: Diagnosis not present

## 2022-12-02 DIAGNOSIS — E559 Vitamin D deficiency, unspecified: Secondary | ICD-10-CM | POA: Diagnosis not present

## 2022-12-09 DIAGNOSIS — Z6830 Body mass index (BMI) 30.0-30.9, adult: Secondary | ICD-10-CM | POA: Diagnosis not present

## 2022-12-09 DIAGNOSIS — E039 Hypothyroidism, unspecified: Secondary | ICD-10-CM | POA: Diagnosis not present

## 2022-12-23 DIAGNOSIS — R7989 Other specified abnormal findings of blood chemistry: Secondary | ICD-10-CM | POA: Diagnosis not present

## 2022-12-23 DIAGNOSIS — Z6830 Body mass index (BMI) 30.0-30.9, adult: Secondary | ICD-10-CM | POA: Diagnosis not present

## 2022-12-30 DIAGNOSIS — Z6831 Body mass index (BMI) 31.0-31.9, adult: Secondary | ICD-10-CM | POA: Diagnosis not present

## 2022-12-30 DIAGNOSIS — E039 Hypothyroidism, unspecified: Secondary | ICD-10-CM | POA: Diagnosis not present

## 2022-12-30 DIAGNOSIS — R7301 Impaired fasting glucose: Secondary | ICD-10-CM | POA: Diagnosis not present

## 2023-01-06 DIAGNOSIS — Z6830 Body mass index (BMI) 30.0-30.9, adult: Secondary | ICD-10-CM | POA: Diagnosis not present

## 2023-01-06 DIAGNOSIS — R7301 Impaired fasting glucose: Secondary | ICD-10-CM | POA: Diagnosis not present

## 2023-01-20 DIAGNOSIS — E039 Hypothyroidism, unspecified: Secondary | ICD-10-CM | POA: Diagnosis not present

## 2023-01-20 DIAGNOSIS — Z6831 Body mass index (BMI) 31.0-31.9, adult: Secondary | ICD-10-CM | POA: Diagnosis not present

## 2023-02-18 DIAGNOSIS — M5416 Radiculopathy, lumbar region: Secondary | ICD-10-CM | POA: Diagnosis not present

## 2023-03-04 DIAGNOSIS — E039 Hypothyroidism, unspecified: Secondary | ICD-10-CM | POA: Diagnosis not present

## 2023-03-04 DIAGNOSIS — Z6830 Body mass index (BMI) 30.0-30.9, adult: Secondary | ICD-10-CM | POA: Diagnosis not present

## 2023-03-04 DIAGNOSIS — R7989 Other specified abnormal findings of blood chemistry: Secondary | ICD-10-CM | POA: Diagnosis not present

## 2023-03-17 DIAGNOSIS — R7301 Impaired fasting glucose: Secondary | ICD-10-CM | POA: Diagnosis not present

## 2023-03-17 DIAGNOSIS — R8290 Unspecified abnormal findings in urine: Secondary | ICD-10-CM | POA: Diagnosis not present

## 2023-03-17 DIAGNOSIS — E039 Hypothyroidism, unspecified: Secondary | ICD-10-CM | POA: Diagnosis not present

## 2023-03-17 DIAGNOSIS — E559 Vitamin D deficiency, unspecified: Secondary | ICD-10-CM | POA: Diagnosis not present

## 2023-03-17 DIAGNOSIS — Z114 Encounter for screening for human immunodeficiency virus [HIV]: Secondary | ICD-10-CM | POA: Diagnosis not present

## 2023-03-17 DIAGNOSIS — I10 Essential (primary) hypertension: Secondary | ICD-10-CM | POA: Diagnosis not present

## 2023-03-17 DIAGNOSIS — E782 Mixed hyperlipidemia: Secondary | ICD-10-CM | POA: Diagnosis not present

## 2023-03-17 DIAGNOSIS — R635 Abnormal weight gain: Secondary | ICD-10-CM | POA: Diagnosis not present

## 2023-03-19 DIAGNOSIS — G43019 Migraine without aura, intractable, without status migrainosus: Secondary | ICD-10-CM | POA: Diagnosis not present

## 2023-03-19 DIAGNOSIS — R21 Rash and other nonspecific skin eruption: Secondary | ICD-10-CM | POA: Diagnosis not present

## 2023-03-19 DIAGNOSIS — Z9689 Presence of other specified functional implants: Secondary | ICD-10-CM | POA: Diagnosis not present

## 2023-03-19 DIAGNOSIS — I10 Essential (primary) hypertension: Secondary | ICD-10-CM | POA: Diagnosis not present

## 2023-03-19 DIAGNOSIS — E039 Hypothyroidism, unspecified: Secondary | ICD-10-CM | POA: Diagnosis not present

## 2023-03-19 DIAGNOSIS — F3340 Major depressive disorder, recurrent, in remission, unspecified: Secondary | ICD-10-CM | POA: Diagnosis not present

## 2023-03-19 DIAGNOSIS — F411 Generalized anxiety disorder: Secondary | ICD-10-CM | POA: Diagnosis not present

## 2023-03-19 DIAGNOSIS — G8929 Other chronic pain: Secondary | ICD-10-CM | POA: Diagnosis not present

## 2023-03-19 DIAGNOSIS — M545 Low back pain, unspecified: Secondary | ICD-10-CM | POA: Diagnosis not present

## 2023-05-20 DIAGNOSIS — M961 Postlaminectomy syndrome, not elsewhere classified: Secondary | ICD-10-CM | POA: Diagnosis not present

## 2023-05-20 DIAGNOSIS — G894 Chronic pain syndrome: Secondary | ICD-10-CM | POA: Diagnosis not present

## 2023-05-20 DIAGNOSIS — G8929 Other chronic pain: Secondary | ICD-10-CM | POA: Diagnosis not present

## 2023-05-20 DIAGNOSIS — M5416 Radiculopathy, lumbar region: Secondary | ICD-10-CM | POA: Diagnosis not present

## 2023-05-20 DIAGNOSIS — M51369 Other intervertebral disc degeneration, lumbar region without mention of lumbar back pain or lower extremity pain: Secondary | ICD-10-CM | POA: Diagnosis not present

## 2023-09-01 ENCOUNTER — Other Ambulatory Visit: Payer: Self-pay | Admitting: Internal Medicine

## 2023-09-01 DIAGNOSIS — Z1231 Encounter for screening mammogram for malignant neoplasm of breast: Secondary | ICD-10-CM

## 2023-09-27 ENCOUNTER — Ambulatory Visit
Admission: RE | Admit: 2023-09-27 | Discharge: 2023-09-27 | Disposition: A | Discharge status: Home / Self Care | Source: Ambulatory Visit | Attending: Internal Medicine | Admitting: Internal Medicine

## 2023-09-27 DIAGNOSIS — Z1231 Encounter for screening mammogram for malignant neoplasm of breast: Secondary | ICD-10-CM | POA: Diagnosis present

## 2023-10-06 ENCOUNTER — Other Ambulatory Visit: Payer: Self-pay | Admitting: Internal Medicine

## 2023-10-06 DIAGNOSIS — R928 Other abnormal and inconclusive findings on diagnostic imaging of breast: Secondary | ICD-10-CM

## 2023-10-07 ENCOUNTER — Inpatient Hospital Stay: Admission: RE | Admit: 2023-10-07 | Source: Ambulatory Visit

## 2023-10-08 ENCOUNTER — Ambulatory Visit
Admission: RE | Admit: 2023-10-08 | Discharge: 2023-10-08 | Disposition: A | Discharge status: Home / Self Care | Source: Ambulatory Visit | Attending: Internal Medicine | Admitting: Internal Medicine

## 2023-10-08 DIAGNOSIS — R928 Other abnormal and inconclusive findings on diagnostic imaging of breast: Secondary | ICD-10-CM | POA: Insufficient documentation

## 2023-11-18 ENCOUNTER — Ambulatory Visit

## 2023-11-18 DIAGNOSIS — L918 Other hypertrophic disorders of the skin: Secondary | ICD-10-CM | POA: Diagnosis not present

## 2023-11-18 DIAGNOSIS — L820 Inflamed seborrheic keratosis: Secondary | ICD-10-CM | POA: Diagnosis not present

## 2023-11-18 DIAGNOSIS — L811 Chloasma: Secondary | ICD-10-CM

## 2023-11-18 MED ORDER — AZELAIC ACID 15 % EX GEL: CUTANEOUS | 5 refills | Status: AC

## 2023-11-18 NOTE — Progress Notes (Signed)
 Subjective   Stacy Stephenson is a 48 y.o. female who presents for the following: Lesion(s) of concern . Patient is new patient  Today patient reports: Areas of concern on the neck Area of concern on the left hip Melasma not currently being treated  Review of Systems:    No other skin or systemic complaints except as noted in HPI or Assessment and Plan.  The following portions of the chart were reviewed this encounter and updated as appropriate: medications, allergies, medical history  Relevant Medical History:  Family history of skin cancer - Mother; BCC   Objective  Well appearing patient in no apparent distress; mood and affect are within normal limits. Examination was performed of the: Sun Exposed Exam: Scalp, head, eyes, ears, nose, lips, neck, upper extremities, hands, fingers, fingernails  Examination notable for: Seborrheic Keratosis(es): Stuck-on appearing keratotic papule(s) on the trunk, some  irritated with redness, crusting, edema, and/or partial avulsion, Acrochordon(s): soft, fleshy, skin-colored to tan pedunculated papules located on the neck, Melasma: Hyperpigmented macules and patches with coalescence located symmetrically on the cheeks and of the face   Examination limited by: Clothing and Patient deferred removal     Right Postauricular, left hip (3) Stuck on waxy paps with erythema  Assessment & Plan   Melasma - explained the etiology of the disorder, its difficulty to treat, and chronic nature - Strict sun protection with sun avoidance and tinted broad-spectrum sunscreen (with visible light protection), ideally SPF 50+, but at least 30+ (provided handout with recommendations). Warned that even 10 minutes of unprotected sun exposure while clear can flare melasma - Counseled that this is a chronic condition that will likely resolve in later years of life - Counseled on goals of treatment - primarily prevention of flares and minimizing pigmentation, not  necessarily complete clearance - recommended diligent sun protection - start compounded topical medication SM75 Tranexamic Acid 8% / Azelaic Acid 10% / Niacinamide 2% Cream from [employer] - Rec Eucerin thiamidol  - Patient is scheduled for IUD placement 12/02/23 for management of fibroids  Acrochordons Around the neck  - Benign, patient reassured of the benign nature of acrochrodons, in some patients related to insulin resistance - Explained that more lesions may appear over time - Discussed cosmetic removal   Chronic and persistent condition with duration or expected duration over one year. Condition is symptomatic and bothersome to patient. Patient is flaring and not currently at treatment goal.   Procedures, orders, diagnosis for this visit:  INFLAMED SEBORRHEIC KERATOSIS (3) Right Postauricular, left hip (3) Symptomatic, irritating, patient would like treated. Destruction of lesion - Right Postauricular, left hip (3) Complexity: simple   Destruction method: cryotherapy   Informed consent: discussed and consent obtained   Timeout:  patient name, date of birth, surgical site, and procedure verified Lesion destroyed using liquid nitrogen: Yes   Region frozen until ice ball extended beyond lesion: Yes   Cryo cycles: 1 or 2. Outcome: patient tolerated procedure well with no complications   Post-procedure details: wound care instructions given     Inflamed seborrheic keratosis -     Destruction of lesion  Other orders -     Azelaic Acid; After skin is thoroughly washed and patted dry, gently but thoroughly massage a thin film of azelaic acid gel into the affected area twice daily, in the morning and evening.  Dispense: 50 g; Refill: 5    Return to clinic: Return in about 6 months (around 05/18/2024) for Melasma.  I,  Emerick Ege, CMA am acting as scribe for Lauraine JAYSON Kanaris, MD   Documentation: I have reviewed the above documentation for accuracy and completeness, and I  agree with the above.  Lauraine JAYSON Kanaris, MD

## 2023-11-18 NOTE — Patient Instructions (Addendum)
 Instructions for [facility] Medications  One or more of your medications was sent to the [facility] mail order compounding pharmacy. You will receive an email from them and can purchase the medicine through that link. It will then be mailed to your home at the address you confirmed. If for any reason you do not receive an email from them, please check your spam folder. If you still do not find the email, please let us  know. [facility] phone number is [phone].   Sunscreen  Who needs sunscreen? Everyone. Sunscreen use can help prevent skin cancer by protecting you from the sun's harmful ultraviolet rays. Anyone can get skin cancer, regardless of age, gender or race. In fact, it is estimated that one in five Americans will develop skin cancer in their lifetime.  Sunscreen alone cannot fully protect you. In addition to wearing sunscreen, dermatologists recommend taking the following steps to protect your skin and find skin cancer early:  Seek shade when appropriate, remembering that the sun's rays are strongest between 10 a.m. and 2 p.m. If your shadow is shorter than you are, seek shade. Dress to protect yourself from the sun by wearing a lightweight long-sleeved shirt, pants, a wide-brimmed hat and sunglasses, when possible.  Use extra caution near water, snow and sand as they reflect the damaging rays of the sun, which can increase your chance of sunburn.  Get vitamin D  safely through a healthy diet that may include vitamin supplements. Don't seek the sun. Avoid tanning beds. Ultraviolet light from the sun and tanning beds can cause skin cancer and wrinkling. If you want to look tan, you may wish to use a self-tanning product, but continue to use sunscreen with it.  When should I use sunscreen? Every day you go outside--even if you're just walking to and from your form of transportation. The sun emits harmful UV rays year-round. Even on cloudy days, up to 80 percent of the sun's  harmful UV rays can penetrate your skin. Snow, sand and water increase the need for sunscreen because they reflect the sun's rays.  How much sunscreen should I use, and how often should I apply it? Most people only apply 25-50 percent of the recommended amount of sunscreen. Apply enough sunscreen to cover all exposed skin. Most adults need about 1 ounce -- or enough to fill a shot glass -- to fully cover their body.  Don't forget to apply to the tops of your feet, your neck, your ears and the top of your head. Apply sunscreen to dry skin 15 minutes before going outdoors.  Skin cancer also can form on the lips. To protect your lips, apply a lip balm or lipstick that contains sunscreen with an SPF of 30 or higher.  When outdoors, reapply sunscreen approximately every two hours, or after swimming or sweating, according to the directions on the bottle.   Broad-spectrum sunscreens protect against both UVA and UVB rays. What is the difference between the rays? Sunlight consists of two types of harmful rays that reach the earth -- UVA rays and UVB rays. Overexposure to either can lead to skin cancer. In addition to causing skin cancer, here's what each of these rays do:  UVA rays (or aging rays) can prematurely age your skin, causing wrinkles and age spots, and can pass through window glass. UVB rays (or burning rays) are the primary cause of sunburn and are blocked by window glass  There is no safe way to tan. Every time you tan,  you damage your skin. As this damage builds, you speed up the aging of your skin and increase your risk for all types of skin cancer.  What is the difference between chemical and physical sunscreens? Chemical sunscreens work like a sponge, absorbing the sun's rays. They contain one or more of the following active ingredients: oxybenzone, avobenzone, octisalate, octocrylene, homosalate and octinoxate. These formulations tend to be easier to rub into the skin without leaving a white  residue.   Physical sunscreens work like a shield, sitting sit on the surface of your skin and deflecting the sun's rays. They contain the active ingredients zinc oxide and/or titanium dioxide. Use this sunscreen if you have sensitive skin.   What type of sunscreen should I use? The best type of sunscreen is the one you will use again and again. Just make sure it offers broad-spectrum (UVA and UVB) protection, has an SPF of 30+, and is water-resistant. The kind of sunscreen you use is a matter of personal choice, and may vary depending on the area of the body to be protected. Available sunscreen options include lotions, creams, gels, ointments, wax sticks and sprays.  Recommended physical sunscreens for face: - Neutrogena Sheer Zinc - Aveeno Positively Mineral Sensitive - CeraVe Hydrating Mineral (also has a tinted version) - La Roche-Posay Anthelios Mineral Face (comes as a cream, lotion, light fluid, and there is also a tinted version).  - EltaMD UV Clear (also has a tinted version)  Recommended physical sunscreens for body: - Neutrogena Sheer Zinc Dry-Touch Sunscreen Sensitive Skin Lotion Broad Spectrum SPF 50 - Aveeno Positively Mineral Sensitive Skin Sunscreen Broad Spectrum SPF 50 - La Roche-Posay Anthelios SPF 50 Mineral Sunscreen - Gentle Lotion - CeraVe Hydrating Mineral Sunscreen SPF 50  Recommended chemical sunscreens for face: - Anthelios UV Correct Face Sunscreen SPF 70 with Niacinamide - Neutrogena Clear Face Oil-Free SPF 50 with Helioplex - Neutrogena Sport Face Oil-Free SPF 70+ with Helioplex - Aveeno Protect + Hydrate Sunscreen For Face SPF 70 - La Roche-Posay Anthelios Light Fluid Sunscreen for Face SPF 60  Recommended chemical sunscreens for body: - Neutrogena Ultra Sheer Dry-Touch Sunscreen SPF 70 - Aveeno Protect + Hydrate Broad Spectrum All-Day Hydration SPF 60 (comes in a big pump) - La Roche-Posay Anthelios Melt-In Milk Sunscreen SPF 60   Melanoma  ABCDEs  Melanoma is the most dangerous type of skin cancer, and is the leading cause of death from skin disease.  You are more likely to develop melanoma if you: Have light-colored skin, light-colored eyes, or red or blond hair Spend a lot of time in the sun Tan regularly, either outdoors or in a tanning bed Have had blistering sunburns, especially during childhood Have a close family member who has had a melanoma Have atypical moles or large birthmarks  Early detection of melanoma is key since treatment is typically straightforward and cure rates are extremely high if we catch it early.   The first sign of melanoma is often a change in a mole or a new dark spot.  The ABCDE system is a way of remembering the signs of melanoma.  A for asymmetry:  The two halves do not match. B for border:  The edges of the growth are irregular. C for color:  A mixture of colors are present instead of an even brown color. D for diameter:  Melanomas are usually (but not always) greater than 6mm - the size of a pencil eraser. E for evolution:  The spot keeps changing in  size, shape, and color.  Please check your skin once per month between visits. You can use a small mirror in front and a large mirror behind you to keep an eye on the back side or your body.   If you see any new or changing lesions before your next follow-up, please call to schedule a visit.  Please continue daily skin protection including broad spectrum sunscreen SPF 30+ to sun-exposed areas, reapplying every 2 hours as needed when you're outdoors.     Due to recent changes in healthcare laws, you may see results of your pathology and/or laboratory studies on MyChart before the doctors have had a chance to review them. We understand that in some cases there may be results that are confusing or concerning to you. Please understand that not all results are received at the same time and often the doctors may need to interpret multiple results in  order to provide you with the best plan of care or course of treatment. Therefore, we ask that you please give us  2 business days to thoroughly review all your results before contacting the office for clarification. Should we see a critical lab result, you will be contacted sooner.   If You Need Anything After Your Visit  If you have any questions or concerns for your doctor, please call our main line at [phone] and press option 4 to reach your doctor's medical assistant. If no one answers, please leave a voicemail as directed and we will return your call as soon as possible. Messages left after 4 pm will be answered the following business day.   You may also send us  a message via MyChart. We typically respond to MyChart messages within 1-2 business days.  For prescription refills, please ask your pharmacy to contact our office. Our fax number is [phone].  If you have an urgent issue when the clinic is closed that cannot wait until the next business day, you can page your doctor at the number below.    Please note that while we do our best to be available for urgent issues outside of office hours, we are not available 24/7.   If you have an urgent issue and are unable to reach us , you may choose to seek medical care at your doctor's office, retail clinic, urgent care center, or emergency room.  If you have a medical emergency, please immediately call 911 or go to the emergency department.  Pager Numbers  - Dr. Hester: [phone]  - Dr. Jackquline: [phone]  - Dr. Claudene: [phone]   - Dr. Raymund: [phone]  In the event of inclement weather, please call our main line at [phone] for an update on the status of any delays or closures.  Dermatology Medication Tips: Please keep the boxes that topical medications come in in order to help keep track of the instructions about where and how to use these. Pharmacies typically print the medication instructions only on the  boxes and not directly on the medication tubes.   If your medication is too expensive, please contact our office at [phone] option 4 or send us  a message through MyChart.   We are unable to tell what your co-pay for medications will be in advance as this is different depending on your insurance coverage. However, we may be able to find a substitute medication at lower cost or fill out paperwork to get insurance to cover a needed medication.   If a prior authorization is required to get your medication covered  by your insurance company, please allow us  1-2 business days to complete this process.  Drug prices often vary depending on where the prescription is filled and some pharmacies may offer cheaper prices.  The website www.goodrx.com contains coupons for medications through different pharmacies. The prices here do not account for what the cost may be with help from insurance (it may be cheaper with your insurance), but the website can give you the price if you did not use any insurance.  - You can print the associated coupon and take it with your prescription to the pharmacy.  - You may also stop by our office during regular business hours and pick up a GoodRx coupon card.  - If you need your prescription sent electronically to a different pharmacy, notify our office through [facility] or by phone at [phone] option 4.     Si Usted Necesita Algo Despus de Su Visita  Tambin puede enviarnos un mensaje a travs de Clinical cytogeneticist. Por lo general respondemos a los mensajes de MyChart en el transcurso de 1 a 2 das hbiles.  Para renovar recetas, por favor pida a su farmacia que se ponga en contacto con nuestra oficina. Randi lakes de fax es [place] [phone].  Si tiene un asunto urgente cuando la clnica est cerrada y que no puede esperar hasta el siguiente da hbil, puede llamar/localizar a su doctor(a) al nmero que aparece a continuacin.   Por favor, tenga en cuenta que  aunque hacemos todo lo posible para estar disponibles para asuntos urgentes fuera del horario de [place], no estamos disponibles las 24 horas del da, los 7 [address]  [address] de la [place].   Si tiene un problema urgente y no puede comunicarse con nosotros, puede optar por buscar atencin mdica  en el consultorio de su doctor(a), en una clnica privada, en un centro de atencin urgente o en una sala de emergencias.  Si tiene Engineer, drilling, por favor llame inmediatamente al 911 o vaya a la sala de emergencias.  Nmeros de bper  - Dr. Hester: [phone]  - Dra. Jackquline: [phone]  - Dr. Claudene: [phone]  - Dra. Kitts: [phone]  En caso de inclemencias del [place], por favor llame a nuestra lnea principal al [phone] para una actualizacin sobre el estado de cualquier retraso o cierre.  Consejos para la medicacin en dermatologa: Por favor, guarde las cajas en las que vienen los medicamentos de uso tpico para ayudarle a seguir las instrucciones sobre dnde y cmo usarlos. Las farmacias generalmente imprimen las instrucciones del medicamento slo en las cajas y no directamente en los tubos del [place].   Si su medicamento es muy caro, por favor, pngase en contacto con landry rieger llamando al [phone] y presione la opcin 4 o envenos un mensaje a travs de Clinical cytogeneticist.   No podemos decirle cul ser su copago por los medicamentos por adelantado ya que esto es diferente dependiendo de la cobertura de su seguro. Sin embargo, es posible que podamos encontrar un medicamento sustituto a Audiological scientist un formulario para que el seguro cubra el medicamento que se considera necesario.   Si se requiere una autorizacin previa para que su compaa de seguros malta su medicamento, por favor permtanos de 1 a 2 das hbiles para completar este proceso.  Los precios de los medicamentos varan con frecuencia dependiendo del Environmental consultant de dnde se surte la receta y alguna farmacias  pueden ofrecer precios ms baratos.  El sitio web www.goodrx.com tiene cupones para medicamentos de [facility]  farmacias. Los precios aqu no tienen en cuenta lo que podra costar con la ayuda del seguro (puede ser ms barato con su seguro), pero el sitio web puede darle el precio si no utiliz Tourist information centre manager.  - Puede imprimir el cupn correspondiente y llevarlo con su receta a la farmacia.  - Tambin puede pasar por nuestra oficina durante el horario de atencin regular y Education officer, museum una tarjeta de cupones de GoodRx.  - Si necesita que su receta se enve electrnicamente a una farmacia diferente, informe a nuestra oficina a travs de MyChart de [place] o por telfono llamando al [phone] y presione la opcin 4.

## 2023-12-26 ENCOUNTER — Ambulatory Visit: Admission: EM | Admit: 2023-12-26 | Discharge: 2023-12-26 | Disposition: A | Discharge status: Home / Self Care

## 2023-12-26 ENCOUNTER — Encounter: Payer: Self-pay | Admitting: Emergency Medicine

## 2023-12-26 DIAGNOSIS — J0100 Acute maxillary sinusitis, unspecified: Secondary | ICD-10-CM

## 2023-12-26 MED ORDER — BENZONATATE 100 MG PO CAPS: 100.0000 mg | ORAL_CAPSULE | Freq: Three times a day (TID) | ORAL | 0 refills | Status: AC

## 2023-12-26 MED ORDER — AMOXICILLIN-POT CLAVULANATE 875-125 MG PO TABS: 1.0000 | ORAL_TABLET | Freq: Two times a day (BID) | ORAL | 0 refills | Status: AC

## 2023-12-26 MED ORDER — PROMETHAZINE-DM 6.25-15 MG/5ML PO SYRP: 5.0000 mL | ORAL_SOLUTION | Freq: Every evening | ORAL | 0 refills | Status: AC | PRN

## 2023-12-26 NOTE — ED Provider Notes (Signed)
 CAY RALPH PELT    CSN: [id number] Arrival date & time: 12/26/23  1002      History   Chief Complaint Chief Complaint  Patient presents with   Sore Throat   Cough   Chills   Fatigue    HPI Stacy Stephenson is a 48 y.o. female.   Patient is for evaluation of chills, body aches, increased fatigue, nasal congestion, sore throat and a primarily productive cough present for 2 weeks.  Denies sinus pain or pressure but endorses a generalized fullness to the head.  Associated diarrhea.  Tolerable to food and liquids but appetite is decreased.  Symptoms seem to be getting better during the week to but then began to worsen.  Has attempted Alka-Seltzer and Mucinex.  Possible sick contacts from grandchildren.    Past Medical History:  Diagnosis Date   Cancer (HCC)    cervical   Complication of anesthesia    shaking ( breast surgery) hard to wake up with both surgeries   Depression    history of back in 2000-last her father   Family history of adverse reaction to anesthesia    Aunt (dad's sister had same problems)   Fibromyalgia    Hypertension    Hypothyroidism    Migraines    menstrual but not every month    Patient Active Problem List   Diagnosis Date Noted   Status post insertion of spinal cord stimulator 08/13/2016   Chronic pain 08/13/2016   Discogenic low back pain 11/27/2015   Abnormal drug screen 11/27/2015   Marijuana use 10/15/2015   Pain medication agreement broken 10/15/2015   Lumbosacral spondylosis 08/23/2015   Chronic sacroiliac joint pain (Bilateral) (R>L) 08/23/2015   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) 08/23/2015   Lumbosacral osteoarthritis 08/23/2015   L4-5 & L5-S1 Disc Bulge 08/23/2015   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) 08/23/2015   Lumbar facet arthropathy (L4-5) (Left) 08/23/2015   Lumbosacral foraminal stenosis (L5-S1) (Right) 08/23/2015   History of Right L5-S1 Laminotomy and partial discectomy 08/23/2015    Lumbar facet hypertrophy (L5-S1) (Bilateral) 08/23/2015   L5-S1 paracentral disc protrusion (Right) 08/23/2015   Lumbosacral lateral recess stenosis (L5-S1) (Right) 08/23/2015   Abnormal MRI, lumbar spine (08/09/2015) 08/23/2015   Chronic knee pain (Right) 08/23/2015   Osteoarthritis of knee (Right) 08/23/2015   Claustrophobia 08/08/2015   Excessive and frequent menstruation with irregular cycle 05/30/2015   Occipital headache 05/08/2015   Bilateral occipital neuralgia 05/08/2015   Chronic pain 02/11/2015   Long term current use of opiate analgesic 02/11/2015   Long term prescription opiate use 02/11/2015   Opiate use (22.5 MME/Day) 02/11/2015   Encounter for therapeutic drug level monitoring 02/11/2015   Encounter for chronic pain management 02/11/2015   Chronic low back pain (Location of Primary Source of Pain) (Bilateral) (R>L) 02/11/2015   Lumbar facet syndrome (Bilateral) (R>L) 02/11/2015   Neurogenic pain 02/11/2015   Muscle spasm 02/11/2015   Anxiety state 01/31/2014   Adult hypothyroidism 11/28/2013   Avitaminosis D 11/28/2013   Abnormal weight gain 11/28/2013   Hypothyroidism 11/28/2013   Migraine without aura 06/23/2007   GASTROENTERITIS, ACUTE 06/23/2007   DEPRESSION 01/24/2007   Essential hypertension 01/24/2007    Past Surgical History:  Procedure Laterality Date   BREAST SURGERY  1997   reduction   CERVICAL CONIZATION W/BX     13 yrs. ago   MICRODISCECTOMY LUMBAR     SPINAL CORD STIMULATOR INSERTION N/A 08/13/2016   Procedure: LUMBAR SPINAL CORD  STIMULATOR INSERTION;  Surgeon: Burnetta Aures, MD;  Location: [facility] OR;  Service: Orthopedics;  Laterality: N/A;  150 mins   WISDOM TOOTH EXTRACTION      OB History   No obstetric history on file.      Home Medications    Prior to Admission medications   Medication Sig Start Date End Date Taking? Authorizing Provider  levonorgestrel (MIRENA) 20 MCG/DAY IUD 1 each by Intrauterine route once.   Yes [provider]  Azelaic Acid  15 % gel After skin is thoroughly washed and patted dry, gently but thoroughly massage a thin film of azelaic acid  gel into the affected area twice daily, in the morning and evening. 11/18/23   Raymund Lauraine BROCKS, MD  B Complex-C (SUPER B COMPLEX PO) Take 1 tablet by mouth daily. Patient not taking: Reported on 11/18/2023    [provider]  Biotin 5000 MCG TABS Take 1 tablet by mouth 2 (two) times daily.     [provider]  buPROPion (WELLBUTRIN XL) 150 MG 24 hr tablet bupropion HCl XL 150 mg 24 hr tablet, extended release  TAKE 1 TABLET BY MOUTH EVERY DAY 07/18/18   [provider]  cephALEXin (KEFLEX) 250 MG capsule Take by mouth 4 (four) times daily. Patient not taking: Reported on 11/18/2023    [provider]  clonazePAM  (KLONOPIN ) 2 MG tablet Take 2 mg by mouth at bedtime as needed (Insomnia).  09/04/15   [provider]  fenofibrate (TRICOR) 145 MG tablet Take 145 mg by mouth daily.    [provider]  gabapentin  (NEURONTIN ) 800 MG tablet TAKE 1 TABLET (800 MG TOTAL) BY MOUTH AT BEDTIME. 05/25/16 11/18/23  Myrna Camelia HERO, NP  hydrochlorothiazide  (HYDRODIURIL ) 25 MG tablet TAKE 1 TABLET BY MOUTH ONCE A DAY Patient not taking: Reported on 11/18/2023 07/31/15   [provider]  levothyroxine  (SYNTHROID , LEVOTHROID) 50 MCG tablet Take 50 mcg by mouth daily before breakfast.  11/15/14   [provider]  magnesium  oxide (MAG-OX) 400 MG tablet Take 400 mg by mouth at bedtime.     [provider]  methocarbamol  (ROBAXIN ) 500 MG tablet Take 1 tablet (500 mg total) by mouth 3 (three) times daily as needed for muscle spasms. 08/13/16   Burnetta Aures, MD  metoprolol  tartrate (LOPRESSOR ) 25 MG tablet Take 50 mg by mouth at bedtime.  06/21/14   [provider]  niacin 500 MG CR capsule Take 500 mg by mouth daily.  Patient not taking: Reported on 11/18/2023    [provider]   ondansetron  (ZOFRAN ) 4 MG tablet Take 1 tablet (4 mg total) by mouth every 8 (eight) hours as needed for nausea or vomiting. 08/13/16   Burnetta Aures, MD  oxyCODONE -acetaminophen  (PERCOCET) 10-325 MG tablet Take 1 tablet by mouth every 4 (four) hours as needed for pain. 08/13/16   Burnetta Aures, MD  Potassium 99 MG TABS Take 1 tablet by mouth daily. Patient not taking: Reported on 11/18/2023    [provider]  predniSONE  (STERAPRED UNI-PAK 21 TAB) 10 MG (21) TBPK tablet Take by mouth daily. As directed Patient not taking: Reported on 11/18/2023 11/08/22   Corlis Burnard DEL, NP  SPIRONOLACTONE PO Take by mouth.    [provider]  Vitamin D , Ergocalciferol , (DRISDOL) 50000 units CAPS capsule Take 50,000 Units by mouth every 7 (seven) days. Sundays 01/30/14   [provider]  zinc gluconate 50 MG tablet Take 50 mg by mouth at bedtime.  Patient not  taking: Reported on 11/18/2023    [provider]    Family History Family History  Problem Relation Age of Onset   Arthritis Mother    Heart disease Father    Diabetes Father    Stroke Father    Breast cancer Paternal Aunt     Social History Social History   Tobacco Use   Smoking status: Former    Current packs/day: 0.75    Average packs/day: 0.8 packs/day for 16.0 years (12.0 ttl pk-yrs)    Types: Cigarettes   Smokeless tobacco: Never  Vaping Use   Vaping status: Every Day  Substance Use Topics   Alcohol use: No    Alcohol/week: 0.0 standard drinks of alcohol   Drug use: No     Allergies   Venlafaxine, Fluoxetine hcl, Latex, Other, and Zolpidem tartrate   Review of Systems Review of Systems  Constitutional:  Positive for chills and fatigue. Negative for activity change, appetite change, diaphoresis, fever and unexpected weight change.  HENT:  Positive for congestion and sore throat. Negative for dental problem, drooling, ear discharge, ear pain, facial swelling, hearing loss, mouth sores,  nosebleeds, postnasal drip, rhinorrhea, sinus pressure, sinus pain, sneezing, tinnitus, trouble swallowing and voice change.   Respiratory:  Positive for cough. Negative for apnea, choking, chest tightness, shortness of breath, wheezing and stridor.   Gastrointestinal: Negative.   Skin: Negative.   Neurological: Negative.      Physical Exam Triage Vital Signs ED Triage Vitals  Encounter Vitals Group     BP 12/26/23 1104 115/72     Girls Systolic BP Percentile --      Girls Diastolic BP Percentile --      Boys Systolic BP Percentile --      Boys Diastolic BP Percentile --      Pulse Rate 12/26/23 1104 89     Resp 12/26/23 1104 20     Temp 12/26/23 1104 98 F (36.7 C)     Temp Source 12/26/23 1104 Oral     SpO2 12/26/23 1104 98 %     Weight --      Height --      Head Circumference --      Peak Flow --      Pain Score 12/26/23 1107 8     Pain Loc --      Pain Education --      Exclude from Growth Chart --    No data found.  Updated Vital Signs BP 115/72 (BP Location: Left Arm)   Pulse 89   Temp 98 F (36.7 C) (Oral)   Resp 20   LMP 12/02/2023 (Approximate)   SpO2 98%   Visual Acuity Right Eye Distance:   Left Eye Distance:   Bilateral Distance:    Right Eye Near:   Left Eye Near:    Bilateral Near:     Physical Exam Vitals reviewed.  Constitutional:      Appearance: Normal appearance.  HENT:     Right Ear: Tympanic membrane, ear canal and external ear normal.     Left Ear: Tympanic membrane, ear canal and external ear normal.     Nose: Congestion present.     Right Sinus: Maxillary sinus tenderness and frontal sinus tenderness present.     Left Sinus: Maxillary sinus tenderness and frontal sinus tenderness present.     Mouth/Throat:     Pharynx: No oropharyngeal exudate or posterior oropharyngeal erythema.  Cardiovascular:     Rate and Rhythm: Normal  rate and regular rhythm.     Pulses: Normal pulses.     Heart sounds: Normal heart sounds.  Pulmonary:      Effort: Pulmonary effort is normal.     Breath sounds: Normal breath sounds.  Neurological:     Mental Status: She is alert and oriented to person, place, and time. Mental status is at baseline.      UC Treatments / Results  Labs (all labs ordered are listed, but only abnormal results are displayed) Labs Reviewed - No data to display  EKG   Radiology No results found.  Procedures Procedures (including critical care time)  Medications Ordered in UC Medications - No data to display  Initial Impression / Assessment and Plan / UC Course  I have reviewed the triage vital signs and the nursing notes.  Pertinent labs & imaging results that were available during my care of the patient were reviewed by me and considered in my medical decision making (see chart for details).  Acute nonrecurrent maxillary sinusitis  Patient is in no signs of distress nor toxic appearing.  Vital signs are stable.  Low suspicion for pneumonia, pneumothorax or bronchitis and therefore will defer imaging.  Viral testing deferred due to timeline.  Prescribed Augmentin  Tessalon  Promethazine  DM, declined use of steroids.May use additional over-the-counter medications as needed for supportive care.  May follow-up with urgent care as needed if symptoms persist or worsen.   Final Clinical Impressions(s) / UC Diagnoses   Final diagnoses:  None   Discharge Instructions   None    ED Prescriptions   None    PDMP not reviewed this encounter.   Teresa Shelba SAUNDERS, NP 12/26/23 [phone]

## 2023-12-26 NOTE — Discharge Instructions (Signed)
 Today being treated for sinus infection  Begin Augmentin  twice daily for 7 days for treatment of bacteria causing symptoms to linger  You may use Tessalon  pill every 8 hours as needed for coughing and may use cough syrup at bedtime to allow for rest  You can take Tylenol  and/or Ibuprofen as needed for fever reduction and pain relief.   For cough: honey 1/2 to 1 teaspoon (you can dilute the honey in water or another fluid).  You can also use guaifenesin and dextromethorphan for cough. You can use a humidifier for chest congestion and cough.  If you don't have a humidifier, you can sit in the bathroom with the hot shower running.      For sore throat: try warm salt water gargles, cepacol lozenges, throat spray, warm tea or water with lemon/honey, popsicles or ice, or OTC cold relief medicine for throat discomfort.   For congestion: take a daily anti-histamine like Zyrtec, Claritin, and a oral decongestant, such as pseudoephedrine.  You can also use Flonase 1-2 sprays in each nostril daily.   It is important to stay hydrated: drink plenty of fluids (water, gatorade/powerade/pedialyte, juices, or teas) to keep your throat moisturized and help further relieve irritation/discomfort.

## 2023-12-26 NOTE — ED Triage Notes (Signed)
 Patient reports sore throat, cough, chills and fatigue x 2 weeks. Patient has been OTC medication with no relief. Rates pain 8/10.

## 2024-05-18 ENCOUNTER — Ambulatory Visit
